# Patient Record
Sex: Female | Born: 1945
Health system: Southern US, Community
[De-identification: ages and names within clinical notes are randomized; demographics above are authoritative.]

## PROBLEM LIST (undated history)

## (undated) DIAGNOSIS — E785 Hyperlipidemia, unspecified: Secondary | ICD-10-CM

## (undated) DIAGNOSIS — G4700 Insomnia, unspecified: Secondary | ICD-10-CM

## (undated) DIAGNOSIS — H269 Unspecified cataract: Secondary | ICD-10-CM

## (undated) DIAGNOSIS — M1990 Unspecified osteoarthritis, unspecified site: Secondary | ICD-10-CM

## (undated) DIAGNOSIS — T7840XA Allergy, unspecified, initial encounter: Secondary | ICD-10-CM

## (undated) DIAGNOSIS — I10 Essential (primary) hypertension: Secondary | ICD-10-CM

## (undated) DIAGNOSIS — Z8742 Personal history of other diseases of the female genital tract: Secondary | ICD-10-CM

## (undated) HISTORY — DX: Unspecified osteoarthritis, unspecified site: M19.90

## (undated) HISTORY — DX: Insomnia, unspecified: G47.00

## (undated) HISTORY — DX: Unspecified cataract: H26.9

## (undated) HISTORY — DX: Allergy, unspecified, initial encounter: T78.40XA

## (undated) HISTORY — DX: Essential (primary) hypertension: I10

## (undated) HISTORY — DX: Hyperlipidemia, unspecified: E78.5

## (undated) HISTORY — PX: ABDOMINAL HYSTERECTOMY: SHX81

## (undated) HISTORY — PX: KNEE SURGERY: SHX244

## (undated) HISTORY — PX: COLONOSCOPY: SHX174

## (undated) HISTORY — DX: Personal history of other diseases of the female genital tract: Z87.42

---

## 1999-04-10 ENCOUNTER — Other Ambulatory Visit: Admission: RE | Admit: 1999-04-10 | Discharge: 1999-04-10 | Payer: Self-pay | Admitting: Family Medicine

## 2000-07-28 ENCOUNTER — Other Ambulatory Visit: Admission: RE | Admit: 2000-07-28 | Discharge: 2000-07-28 | Payer: Self-pay | Admitting: Family Medicine

## 2001-08-08 ENCOUNTER — Other Ambulatory Visit: Admission: RE | Admit: 2001-08-08 | Discharge: 2001-08-08 | Payer: Self-pay | Admitting: Family Medicine

## 2002-12-13 ENCOUNTER — Other Ambulatory Visit: Admission: RE | Admit: 2002-12-13 | Discharge: 2002-12-13 | Payer: Self-pay | Admitting: Family Medicine

## 2003-12-23 ENCOUNTER — Other Ambulatory Visit: Admission: RE | Admit: 2003-12-23 | Discharge: 2003-12-23 | Payer: Self-pay | Admitting: Family Medicine

## 2005-05-18 ENCOUNTER — Other Ambulatory Visit: Admission: RE | Admit: 2005-05-18 | Discharge: 2005-05-18 | Payer: Self-pay | Admitting: *Deleted

## 2005-05-31 ENCOUNTER — Ambulatory Visit (HOSPITAL_COMMUNITY): Admission: RE | Admit: 2005-05-31 | Discharge: 2005-05-31 | Payer: Self-pay

## 2005-06-03 ENCOUNTER — Ambulatory Visit (HOSPITAL_COMMUNITY): Admission: RE | Admit: 2005-06-03 | Discharge: 2005-06-03 | Payer: Self-pay | Admitting: Family Medicine

## 2006-05-20 ENCOUNTER — Other Ambulatory Visit: Admission: RE | Admit: 2006-05-20 | Discharge: 2006-05-20 | Payer: Self-pay | Admitting: Family Medicine

## 2009-07-01 ENCOUNTER — Encounter (INDEPENDENT_AMBULATORY_CARE_PROVIDER_SITE_OTHER): Payer: Self-pay

## 2010-07-14 NOTE — Letter (Signed)
Summary: Pre Visit No Show Letter  [facility]  [address] [place], [place] [address]   Phone: [phone]  Fax: [phone]        July 01, 2009 MRN: [id number]    Mary Wilkinson [address] [place], [place]  [address]    Dear Ms. Myler,   We have been unable to reach you by phone concerning the pre-procedure visit that you missed on 07/01/2009. For this reason,your procedure scheduled on 07/15/2009  has been cancelled. Our scheduling staff will gladly assist you with rescheduling your appointments at a more convenient time. Please call our office at [phone] between the hours of 8:00am and 5:00pm, press option #2 to reach an appointment scheduler. Please consider updating your contact numbers at this time so that we can reach you by phone in the future with schedule changes or results.    Thank you,    Ulis Rias RN [facility]

## 2011-01-20 ENCOUNTER — Ambulatory Visit (AMBULATORY_SURGERY_CENTER): Payer: BC Managed Care – PPO | Admitting: *Deleted

## 2011-01-20 ENCOUNTER — Encounter: Payer: Self-pay | Admitting: Internal Medicine

## 2011-01-20 VITALS — Ht 61.0 in | Wt 157.3 lb

## 2011-01-20 DIAGNOSIS — Z1211 Encounter for screening for malignant neoplasm of colon: Secondary | ICD-10-CM

## 2011-01-20 MED ORDER — PEG-KCL-NACL-NASULF-NA ASC-C 100 G PO SOLR: 1.0000 | Freq: Once | ORAL | Status: DC

## 2011-02-02 ENCOUNTER — Telehealth: Payer: Self-pay | Admitting: Internal Medicine

## 2011-02-02 DIAGNOSIS — Z1211 Encounter for screening for malignant neoplasm of colon: Secondary | ICD-10-CM

## 2011-02-02 MED ORDER — PEG-KCL-NACL-NASULF-NA ASC-C 100 G PO SOLR: 1.0000 | Freq: Once | ORAL | Status: DC

## 2011-02-02 NOTE — Telephone Encounter (Signed)
Prescription resent to [facility] pharmacy in [place].  Pharmacy also called and a message left to call [facility] if Moviprep rx not received.  Pt aware

## 2011-02-03 ENCOUNTER — Ambulatory Visit (AMBULATORY_SURGERY_CENTER): Payer: BC Managed Care – PPO | Admitting: Internal Medicine

## 2011-02-03 ENCOUNTER — Encounter: Payer: Self-pay | Admitting: Internal Medicine

## 2011-02-03 VITALS — BP 145/75 | HR 61 | Temp 97.7°F | Resp 16 | Ht 61.0 in | Wt 151.0 lb

## 2011-02-03 DIAGNOSIS — Z1211 Encounter for screening for malignant neoplasm of colon: Secondary | ICD-10-CM

## 2011-02-03 MED ORDER — SODIUM CHLORIDE 0.9 % IV SOLN: 500.0000 mL | INTRAVENOUS | Status: DC

## 2011-02-03 NOTE — Patient Instructions (Signed)
Discharge instructions reviewed with patient and care partner.  Normal exam.  May resume medications as you had been taking them prior to your procedure.

## 2011-02-03 NOTE — Progress Notes (Signed)
Pt blood pressure 81/45. MD notified. IVF open to gravity. Pt arousable, able to state name and care partners name. Transferring to recovery room.

## 2011-02-04 ENCOUNTER — Telehealth: Payer: Self-pay

## 2011-02-04 NOTE — Telephone Encounter (Signed)

## 2011-05-05 DIAGNOSIS — M25562 Pain in left knee: Secondary | ICD-10-CM | POA: Insufficient documentation

## 2011-10-06 ENCOUNTER — Encounter: Payer: Self-pay | Admitting: Internal Medicine

## 2011-10-07 ENCOUNTER — Ambulatory Visit (INDEPENDENT_AMBULATORY_CARE_PROVIDER_SITE_OTHER): Payer: BC Managed Care – PPO | Admitting: Internal Medicine

## 2011-10-07 ENCOUNTER — Encounter: Payer: Self-pay | Admitting: Internal Medicine

## 2011-10-07 VITALS — BP 138/80 | HR 60 | Ht 61.0 in | Wt 156.0 lb

## 2011-10-07 DIAGNOSIS — K6289 Other specified diseases of anus and rectum: Secondary | ICD-10-CM

## 2011-10-07 DIAGNOSIS — E785 Hyperlipidemia, unspecified: Secondary | ICD-10-CM | POA: Insufficient documentation

## 2011-10-07 DIAGNOSIS — K648 Other hemorrhoids: Secondary | ICD-10-CM | POA: Insufficient documentation

## 2011-10-07 DIAGNOSIS — I10 Essential (primary) hypertension: Secondary | ICD-10-CM | POA: Insufficient documentation

## 2011-10-07 MED ORDER — HYDROCORTISONE ACETATE 25 MG RE SUPP: 25.0000 mg | Freq: Two times a day (BID) | RECTAL | Status: DC

## 2011-10-07 MED ORDER — HYDROCORTISONE ACETATE 25 MG RE SUPP: 25.0000 mg | Freq: Two times a day (BID) | RECTAL | Status: AC

## 2011-10-07 NOTE — Patient Instructions (Signed)
We have sent medications to your pharmacy for you to pick up at your convenience   

## 2011-10-07 NOTE — Progress Notes (Signed)
Subjective:    Patient ID: Mary Wilkinson, female    DOB: 01/22/1946, 66 y.o.   MRN: [id number]  HPI Mary Wilkinson is a 66 yo female with a past mental history of hypertension hyperlipidemia who is seen for evaluation of rectal discomfort. The patient had a screening colonoscopy on 02/03/2011 which was normal except for small internal hemorrhoids. She reports the sensation of rectal fullness for the presence of "piece of tissue" around her rectum. This is been going on for several months, and she first noticed it after her colonoscopy in August. She reports that this is most noticeable is sitting and when lying down at night. She's not had any abdominal pain. No nausea or vomiting. She is eating well. No fevers or chills. She has not had constipation or diarrhea. No rectal bleeding or melena. She is having normal bowel movements, and having one soft but formed stool daily. She does drink Muscadine juice every night to help keep her bowel habits regular. She denies tenesmus.  Review of Systems As per history of present illness, otherwise negative  Past Medical History  Diagnosis Date  . Allergy   . Arthritis     left knee  . Hyperlipidemia   . Hypertension    Past Surgical History  Procedure Date  . Abdominal hysterectomy   . Knee surgery     left   Current Outpatient Prescriptions  Medication Sig Dispense Refill  . atorvastatin (LIPITOR) 20 MG tablet Take 20 mg by mouth daily.        Marland Kitchen ESTRADIOL PO Take 1 mg by mouth daily.        Marland Kitchen lisinopril (PRINIVIL,ZESTRIL) 40 MG tablet Take 40 mg by mouth daily.        . Omega-3 Fatty Acids (FISH OIL PO) Take 1 tablet by mouth daily.        . Thiamine HCl (VITAMIN B-1 PO) Take 1 tablet by mouth daily.        . hydrocortisone (ANUSOL-HC) 25 MG suppository Place 1 suppository (25 mg total) rectally 2 (two) times daily.  24 suppository  1   No Known Allergies  Family History  Problem Relation Age of Onset  . Colon cancer Neg Hx   . Esophageal cancer  Neg Hx   . Stomach cancer Neg Hx    History  Substance Use Topics  . Smoking status: Never Smoker   . Smokeless tobacco: Never Used  . Alcohol Use: No       Objective:   Physical Exam BP 138/80  Pulse 60  Ht 5\' 1"  (1.549 m)  Wt 156 lb (70.761 kg)  BMI 29.48 kg/m2 Constitutional: Well-developed and well-nourished. No distress. HEENT: Normocephalic and atraumatic. Oropharynx is clear and moist. No oropharyngeal exudate. Conjunctivae are normal. Pupils are equal round and reactive to light. No scleral icterus. Neck: Neck supple. Trachea midline. Cardiovascular: Normal rate, regular rhythm and intact distal pulses. No M/R/G Pulmonary/chest: Effort normal and breath sounds normal. No wheezing, rales or rhonchi. Abdominal: Soft, nontender, nondistended. Bowel sounds active throughout. There are no masses palpable. No hepatosplenomegaly. Rectal: Nontender, no external hemorrhoids or fissures seen, soft brown stool in the vault, no rectal masses Extremities: no clubbing, cyanosis, or edema Lymphadenopathy: No cervical adenopathy noted. Neurological: Alert and oriented to person place and time. Skin: Skin is warm and dry. No rashes noted. Psychiatric: Normal mood and affect. Behavior is normal.     Assessment & Plan:  66 yo female with a past mental history of hypertension  hyperlipidemia who is seen for evaluation of rectal discomfort  1. Rectal discomfort -- the patient has had screening colonoscopy in August 2012 which revealed internal hemorrhoids, but was otherwise normal. Having had this exam recently, this is very reassuring against other more concerning or ominous pathology. This may be secondary to internal hemorrhoids, and I have recommended hydrocortisone suppository twice a day for 10 days. Hopefully this will treat internal hemorrhoids, and improve her rectal sensation/discomfort. I've asked her to notify me if this medication does not help or should she develop other symptoms  such as tenesmus, rectal bleeding, abdominal pain, etc. She voices understanding and is happy with this plan.    Followup as needed

## 2012-02-25 ENCOUNTER — Ambulatory Visit: Payer: BC Managed Care – PPO | Admitting: Family Medicine

## 2012-03-09 ENCOUNTER — Encounter: Payer: Self-pay | Admitting: Family Medicine

## 2012-03-09 ENCOUNTER — Ambulatory Visit (INDEPENDENT_AMBULATORY_CARE_PROVIDER_SITE_OTHER): Payer: BC Managed Care – PPO | Admitting: Family Medicine

## 2012-03-09 VITALS — BP 138/84 | HR 74 | Resp 15 | Ht 60.75 in | Wt 155.4 lb

## 2012-03-09 DIAGNOSIS — R251 Tremor, unspecified: Secondary | ICD-10-CM

## 2012-03-09 DIAGNOSIS — E785 Hyperlipidemia, unspecified: Secondary | ICD-10-CM

## 2012-03-09 DIAGNOSIS — I10 Essential (primary) hypertension: Secondary | ICD-10-CM

## 2012-03-09 DIAGNOSIS — Z780 Asymptomatic menopausal state: Secondary | ICD-10-CM

## 2012-03-09 DIAGNOSIS — N951 Menopausal and female climacteric states: Secondary | ICD-10-CM

## 2012-03-09 DIAGNOSIS — R259 Unspecified abnormal involuntary movements: Secondary | ICD-10-CM

## 2012-03-09 DIAGNOSIS — R51 Headache: Secondary | ICD-10-CM

## 2012-03-09 DIAGNOSIS — M1710 Unilateral primary osteoarthritis, unspecified knee: Secondary | ICD-10-CM | POA: Insufficient documentation

## 2012-03-09 DIAGNOSIS — M179 Osteoarthritis of knee, unspecified: Secondary | ICD-10-CM | POA: Insufficient documentation

## 2012-03-09 DIAGNOSIS — R519 Headache, unspecified: Secondary | ICD-10-CM | POA: Insufficient documentation

## 2012-03-09 DIAGNOSIS — IMO0002 Reserved for concepts with insufficient information to code with codable children: Secondary | ICD-10-CM

## 2012-03-09 LAB — BASIC METABOLIC PANEL
BUN: 10 mg/dL (ref 6–23)
CO2: 29 mEq/L (ref 19–32)
Chloride: 104 mEq/L (ref 96–112)
Glucose, Bld: 80 mg/dL (ref 70–99)
Potassium: 3.9 mEq/L (ref 3.5–5.3)

## 2012-03-09 LAB — CBC
MCH: 27.1 pg (ref 26.0–34.0)
MCHC: 33.6 g/dL (ref 30.0–36.0)
MCV: 80.5 fL (ref 78.0–100.0)
Platelets: 294 10*3/uL (ref 150–400)
RBC: 4.73 MIL/uL (ref 3.87–5.11)

## 2012-03-09 MED ORDER — ATORVASTATIN CALCIUM 40 MG PO TABS: 20.0000 mg | ORAL_TABLET | Freq: Every day | ORAL | Status: DC

## 2012-03-09 NOTE — Assessment & Plan Note (Signed)
Will trend, good control

## 2012-03-09 NOTE — Assessment & Plan Note (Signed)
Lipo profile shows large LDLD particles, her lipitor was recently increased to 40mg , I do not see entire FLP today, will obtain with records

## 2012-03-09 NOTE — Patient Instructions (Addendum)
Continue your current medications I will get your records  Schedule your Mammogram  Restart Calcium  And Vit D  MRI of brain to be done  F/U 3 months

## 2012-03-09 NOTE — Progress Notes (Signed)
  Subjective:    Patient ID: Mary Wilkinson, female    DOB: 1946/05/27, 66 y.o.   MRN: [id number]  HPI  Patient here to establish care. Previous PCP [place]. Ortho- Dr. Anne Hahn, [facility]  Medications and history Patient declines flu shot, TDAP. Up-to-date, colonoscopy UTD Mammogram November  She is concerned about recurrent pains that she gets on the left side of her head that come and go. They're associated with the tremor in her face and lasts for a few minutes. She is unsure of any insinuating factors she denies any aura prior to. Denies any change in vision occasionally has dizziness. She did discuss this with her previous PCP however that time decided to wait consider being seen by neurology and this is been recurrent for the past 6 months.  Review of Systems  GEN- denies fatigue, fever, weight loss,weakness, recent illness HEENT- denies eye drainage, change in vision, nasal discharge, CVS- denies chest pain, palpitations RESP- denies SOB, cough, wheeze ABD- denies N/V, change in stools, abd pain GU- denies dysuria, hematuria, dribbling, incontinence MSK- + joint pain, muscle aches, injury Neuro- + headache, dizziness, syncope, seizure activity, +tremor      Objective:   Physical Exam GEN- NAD, alert and oriented x3 HEENT- PERRL, EOMI, non injected sclera, pink conjunctiva, MMM, oropharynx clear Neck- Supple, no thryomegaly CVS- RRR, no murmur RESP-CTAB ABD-NABS,soft,NT,ND EXT- No edema Pulses- Radial 2+ Neuro- CNII-XII in tact, no focal deficits       Assessment & Plan:

## 2012-03-09 NOTE — Assessment & Plan Note (Signed)
Her tremor no headaches are concerning. She's had progressed we were symptoms of the past 6 months. I'll obtain MRI of brain and refer to neurology for further evaluation

## 2012-03-09 NOTE — Assessment & Plan Note (Signed)
Followed by ortho, she gets cortisone injections as needed

## 2012-03-09 NOTE — Assessment & Plan Note (Signed)
Per above regarding tremor, MRI , neurology, she has an aunt with similar symptoms, diagnosis is unknown

## 2012-03-09 NOTE — Assessment & Plan Note (Signed)
She's been on hormone therapy since 1990. We discussed importance of coming off the medication. She states she was told by her previous physician and has been trying to cut back. She's now recently cut back to one milligram every other day. We will hold here with this new change she states that her symptoms are worse going down on the medications. She will require a very slow taper with prolonged use

## 2012-04-05 ENCOUNTER — Other Ambulatory Visit (HOSPITAL_COMMUNITY): Payer: BC Managed Care – PPO

## 2012-04-10 ENCOUNTER — Ambulatory Visit (HOSPITAL_COMMUNITY): Admission: RE | Admit: 2012-04-10 | Payer: BC Managed Care – PPO | Source: Ambulatory Visit

## 2012-04-14 ENCOUNTER — Telehealth: Payer: Self-pay | Admitting: Nurse Practitioner

## 2012-04-14 NOTE — Telephone Encounter (Signed)
Pt aware.

## 2012-04-14 NOTE — Telephone Encounter (Signed)
Called pt and LVM, she can call back Monday It seems she cancelled her MRI and the labs were normal so no call was made, but I do not see any documentation where she was trying to call our office and never received calls back Will apologize for any incovience  I still recommend MRI for her symptoms

## 2012-04-17 ENCOUNTER — Telehealth: Payer: Self-pay | Admitting: Family Medicine

## 2012-04-17 MED ORDER — ESTRADIOL 1 MG PO TABS: ORAL_TABLET | ORAL | Status: DC

## 2012-04-17 NOTE — Telephone Encounter (Signed)
Spoke with pt, no longer having tremor, she canceled MRI because she did not hear from our office for a few weeks and had another MRI scheduled for her knee in [place]. She also took herself completely off the estradiol is having severe symptoms of hot flashes and sweats. Advise her to go back at 1 mg every other day as previous she has been on and should therapy for greater than 20 years. She tried extra vitamin however this did not help New script sent

## 2012-05-05 ENCOUNTER — Other Ambulatory Visit: Payer: Self-pay

## 2012-05-05 MED ORDER — ATORVASTATIN CALCIUM 40 MG PO TABS: 40.0000 mg | ORAL_TABLET | Freq: Every day | ORAL | Status: DC

## 2012-06-01 ENCOUNTER — Ambulatory Visit: Payer: BC Managed Care – PPO | Attending: Orthopedic Surgery | Admitting: Physical Therapy

## 2012-06-01 DIAGNOSIS — M25569 Pain in unspecified knee: Secondary | ICD-10-CM | POA: Insufficient documentation

## 2012-06-01 DIAGNOSIS — R5381 Other malaise: Secondary | ICD-10-CM | POA: Insufficient documentation

## 2012-06-01 DIAGNOSIS — M25669 Stiffness of unspecified knee, not elsewhere classified: Secondary | ICD-10-CM | POA: Insufficient documentation

## 2012-06-01 DIAGNOSIS — R269 Unspecified abnormalities of gait and mobility: Secondary | ICD-10-CM | POA: Insufficient documentation

## 2012-06-01 DIAGNOSIS — IMO0001 Reserved for inherently not codable concepts without codable children: Secondary | ICD-10-CM | POA: Insufficient documentation

## 2012-06-05 ENCOUNTER — Ambulatory Visit: Payer: BC Managed Care – PPO | Admitting: Physical Therapy

## 2012-06-08 ENCOUNTER — Ambulatory Visit: Payer: BC Managed Care – PPO | Admitting: Physical Therapy

## 2012-06-12 ENCOUNTER — Ambulatory Visit: Payer: BC Managed Care – PPO | Admitting: Physical Therapy

## 2012-06-13 DIAGNOSIS — Z96652 Presence of left artificial knee joint: Secondary | ICD-10-CM | POA: Insufficient documentation

## 2012-06-15 ENCOUNTER — Ambulatory Visit: Payer: BC Managed Care – PPO | Attending: Orthopedic Surgery | Admitting: Physical Therapy

## 2012-06-15 DIAGNOSIS — R5381 Other malaise: Secondary | ICD-10-CM | POA: Insufficient documentation

## 2012-06-15 DIAGNOSIS — R269 Unspecified abnormalities of gait and mobility: Secondary | ICD-10-CM | POA: Insufficient documentation

## 2012-06-15 DIAGNOSIS — IMO0001 Reserved for inherently not codable concepts without codable children: Secondary | ICD-10-CM | POA: Insufficient documentation

## 2012-06-15 DIAGNOSIS — M25569 Pain in unspecified knee: Secondary | ICD-10-CM | POA: Insufficient documentation

## 2012-06-15 DIAGNOSIS — M25669 Stiffness of unspecified knee, not elsewhere classified: Secondary | ICD-10-CM | POA: Insufficient documentation

## 2012-06-19 ENCOUNTER — Ambulatory Visit: Payer: BC Managed Care – PPO | Admitting: Physical Therapy

## 2012-06-22 ENCOUNTER — Ambulatory Visit: Payer: BC Managed Care – PPO | Admitting: Physical Therapy

## 2012-06-26 ENCOUNTER — Ambulatory Visit: Payer: BC Managed Care – PPO | Admitting: Physical Therapy

## 2012-06-29 ENCOUNTER — Ambulatory Visit: Payer: BC Managed Care – PPO | Admitting: Physical Therapy

## 2012-07-03 ENCOUNTER — Ambulatory Visit: Payer: BC Managed Care – PPO | Admitting: Physical Therapy

## 2012-07-06 ENCOUNTER — Ambulatory Visit: Payer: BC Managed Care – PPO | Admitting: Physical Therapy

## 2012-07-10 ENCOUNTER — Ambulatory Visit: Payer: BC Managed Care – PPO | Admitting: *Deleted

## 2012-09-07 ENCOUNTER — Other Ambulatory Visit: Payer: Self-pay | Admitting: Family Medicine

## 2012-09-18 ENCOUNTER — Ambulatory Visit (INDEPENDENT_AMBULATORY_CARE_PROVIDER_SITE_OTHER): Payer: BC Managed Care – PPO | Admitting: Family Medicine

## 2012-09-18 ENCOUNTER — Encounter: Payer: Self-pay | Admitting: Family Medicine

## 2012-09-18 ENCOUNTER — Telehealth: Payer: Self-pay | Admitting: Nurse Practitioner

## 2012-09-18 ENCOUNTER — Other Ambulatory Visit: Payer: Self-pay

## 2012-09-18 VITALS — BP 132/74 | HR 58 | Resp 18 | Ht 60.75 in | Wt 157.0 lb

## 2012-09-18 DIAGNOSIS — I10 Essential (primary) hypertension: Secondary | ICD-10-CM

## 2012-09-18 DIAGNOSIS — Z1321 Encounter for screening for nutritional disorder: Secondary | ICD-10-CM

## 2012-09-18 DIAGNOSIS — Z780 Asymptomatic menopausal state: Secondary | ICD-10-CM

## 2012-09-18 DIAGNOSIS — E785 Hyperlipidemia, unspecified: Secondary | ICD-10-CM

## 2012-09-18 DIAGNOSIS — IMO0002 Reserved for concepts with insufficient information to code with codable children: Secondary | ICD-10-CM

## 2012-09-18 DIAGNOSIS — N951 Menopausal and female climacteric states: Secondary | ICD-10-CM

## 2012-09-18 DIAGNOSIS — E663 Overweight: Secondary | ICD-10-CM | POA: Insufficient documentation

## 2012-09-18 DIAGNOSIS — Z130 Encounter for screening for diseases of the blood and blood-forming organs and certain disorders involving the immune mechanism: Secondary | ICD-10-CM

## 2012-09-18 DIAGNOSIS — M1710 Unilateral primary osteoarthritis, unspecified knee: Secondary | ICD-10-CM

## 2012-09-18 DIAGNOSIS — Z1329 Encounter for screening for other suspected endocrine disorder: Secondary | ICD-10-CM

## 2012-09-18 DIAGNOSIS — M179 Osteoarthritis of knee, unspecified: Secondary | ICD-10-CM

## 2012-09-18 LAB — CBC
Hemoglobin: 12.2 g/dL (ref 12.0–15.0)
RBC: 4.53 MIL/uL (ref 3.87–5.11)
WBC: 4.9 10*3/uL (ref 4.0–10.5)

## 2012-09-18 MED ORDER — ATORVASTATIN CALCIUM 40 MG PO TABS: ORAL_TABLET | ORAL | Status: DC

## 2012-09-18 NOTE — Assessment & Plan Note (Signed)
Well controlled, fasting labs to be done  Had Mammogram, pt will check on this

## 2012-09-18 NOTE — Progress Notes (Signed)
  Subjective:    Patient ID: Mary Wilkinson, female    DOB: [birth year], 67 y.o.   MRN: [id number]  HPI  Patient here to follow chronic medical problems. she's not been seen since her establishing visit in September of 2013. She recently had a left knee surgery and is currently on Percocet and Advil. She is due for fasting labs. She occasionally has allergies but declines any medications for this. She has been tapering back off her hormone supplement, continues to have some hot flashes Review of Systems  GEN- denies fatigue, fever, weight loss,weakness, recent illness HEENT- denies eye drainage, change in vision, nasal discharge, CVS- denies chest pain, palpitations RESP- denies SOB, cough, wheeze ABD- denies N/V, change in stools, abd pain GU- denies dysuria, hematuria, dribbling, incontinence MSK- denies joint pain, muscle aches, injury Neuro- denies headache, dizziness, syncope, seizure activity      Objective:   Physical Exam GEN- NAD, alert and oriented x3 HEENT- PERRL, EOMI, non injected sclera, pink conjunctiva, MMM, oropharynx clear Neck- Supple, no thryomegaly CVS- RRR, no murmur RESP-CTAB EXT- No edema Pulses- Radial, DP- 2+        Assessment & Plan:

## 2012-09-18 NOTE — Telephone Encounter (Signed)
noted 

## 2012-09-18 NOTE — Telephone Encounter (Signed)
Med refilled for 90 day supply  

## 2012-09-18 NOTE — Assessment & Plan Note (Signed)
S/p surgical intervention, no further meds, advised to decrease aspirin to 81mg  due to NSAID use as well

## 2012-09-18 NOTE — Patient Instructions (Signed)
Get the labs done  We will call with results Check on the Mammogram Try to go to 1/2 a tablet every other day with the hormone  Try the estroven vitamin Take Calcium ( 1200mg ) and Vitamin D ( 800IU) F/U 6 months

## 2012-09-18 NOTE — Assessment & Plan Note (Signed)
Tapering off hormones, will have her decrease to 1/2 tablet QOD

## 2012-09-18 NOTE — Assessment & Plan Note (Signed)
Check FLP and LFT 

## 2012-09-19 LAB — COMPREHENSIVE METABOLIC PANEL
Albumin: 4.4 g/dL (ref 3.5–5.2)
Alkaline Phosphatase: 61 U/L (ref 39–117)
BUN: 11 mg/dL (ref 6–23)
CO2: 29 mEq/L (ref 19–32)
Calcium: 9.8 mg/dL (ref 8.4–10.5)
Chloride: 104 mEq/L (ref 96–112)
Glucose, Bld: 82 mg/dL (ref 70–99)
Potassium: 4.1 mEq/L (ref 3.5–5.3)

## 2012-09-19 LAB — LIPID PANEL
Cholesterol: 187 mg/dL (ref 0–200)
HDL: 68 mg/dL (ref >39)
Total CHOL/HDL Ratio: 2.8 Ratio
Triglycerides: 70 mg/dL (ref <150)
VLDL: 14 mg/dL (ref 0–40)

## 2012-09-21 ENCOUNTER — Telehealth: Payer: Self-pay | Admitting: Family Medicine

## 2012-09-22 NOTE — Telephone Encounter (Signed)
Patient aware.

## 2012-10-26 ENCOUNTER — Ambulatory Visit (INDEPENDENT_AMBULATORY_CARE_PROVIDER_SITE_OTHER): Payer: Medicare Other | Admitting: Family Medicine

## 2012-10-26 ENCOUNTER — Telehealth: Payer: Self-pay | Admitting: Family Medicine

## 2012-10-26 ENCOUNTER — Encounter: Payer: Self-pay | Admitting: Family Medicine

## 2012-10-26 VITALS — BP 150/82 | HR 65 | Resp 16 | Wt 154.0 lb

## 2012-10-26 DIAGNOSIS — G5600 Carpal tunnel syndrome, unspecified upper limb: Secondary | ICD-10-CM | POA: Diagnosis not present

## 2012-10-26 DIAGNOSIS — N951 Menopausal and female climacteric states: Secondary | ICD-10-CM

## 2012-10-26 DIAGNOSIS — I10 Essential (primary) hypertension: Secondary | ICD-10-CM

## 2012-10-26 DIAGNOSIS — Z780 Asymptomatic menopausal state: Secondary | ICD-10-CM

## 2012-10-26 MED ORDER — GABAPENTIN 300 MG PO CAPS: 300.0000 mg | ORAL_CAPSULE | Freq: Every day | ORAL | Status: DC

## 2012-10-26 NOTE — Patient Instructions (Addendum)
Use the wrist splints for 2 weeks, if no improvement Add the gabapentin at bedtime Call if this neither of these help and you will be referred to neurology Try for hot flashes Peridin C, over the counter F/U as previous

## 2012-10-27 ENCOUNTER — Ambulatory Visit: Payer: BC Managed Care – PPO | Admitting: Family Medicine

## 2012-10-30 NOTE — Assessment & Plan Note (Signed)
She's tried to cut back on her estrogen use. She would like to use something more natural. She has used black cohosh and is not helping as much this because she started on estrogen. She still plans to stop estrogen completely. I've also discussed adding vitamin E. and Pericidin C

## 2012-10-30 NOTE — Progress Notes (Signed)
  Subjective:    Patient ID: Mary Wilkinson, female    DOB: [birth year], 67 y.o.   MRN: [id number]  HPI  Patient presents with tingling and numbness has worsened over the past couple of weeks in bilateral hands. She feels like she has to shake her hands out she's had this problem in the past > year ago however it would improve. Often when she is driving or cooking she feels the need to shake her hands out because they tingled. Occasionally she has had a tingling in her feet but it is mostly in her hands. It is also worse when she holds her cell phone. She denies dropping any coffee mugs her cups.  Review of Systems - per above  GEN- denies fatigue, fever, weight loss,weakness, recent illness HEENT- denies eye drainage, change in vision, nasal discharge, CVS- denies chest pain, palpitations RESP- denies SOB, cough, wheeze MSK- denies joint pain, muscle aches, injury Neuro- denies headache, dizziness, syncope, seizure activity      Objective:   Physical Exam  GEN- NAD, alert and oriented x3 HEENT- PERRL, EOMI, non injected sclera, pink conjunctiva, MMM, oropharynx clear Neck- supple, good ROM CVS- RRR, no murmur RESP-CTAB NEURO-CNII-XII in tact, DTR symmetric UE,LE, normal tone, +tinels Right hand, neg prayer sign EXT- No edema Pulses- Radial 2+       Assessment & Plan:

## 2012-10-30 NOTE — Assessment & Plan Note (Signed)
Will treat as carpal tunnel syndrome and bilateral wrists. She will use wrist splints for the next few weeks. If this does not improve she is going to add gabapentin at bedtime she has no relief from either of these and she will be sent to neurology for nerve conduction studies patient agrees with this

## 2012-10-30 NOTE — Assessment & Plan Note (Signed)
BP elevated some today, no change to meds, previously controlled

## 2012-11-09 NOTE — Telephone Encounter (Signed)
Patient is aware 

## 2012-11-10 ENCOUNTER — Other Ambulatory Visit: Payer: Self-pay

## 2012-11-13 ENCOUNTER — Telehealth: Payer: Self-pay | Admitting: Family Medicine

## 2012-11-13 MED ORDER — LISINOPRIL 40 MG PO TABS: 40.0000 mg | ORAL_TABLET | Freq: Every day | ORAL | Status: DC

## 2012-11-13 NOTE — Telephone Encounter (Signed)
Med rf °

## 2013-01-01 DIAGNOSIS — Z96659 Presence of unspecified artificial knee joint: Secondary | ICD-10-CM | POA: Diagnosis not present

## 2013-01-01 DIAGNOSIS — Z471 Aftercare following joint replacement surgery: Secondary | ICD-10-CM | POA: Diagnosis not present

## 2013-01-04 ENCOUNTER — Other Ambulatory Visit: Payer: Self-pay | Admitting: Family Medicine

## 2013-01-10 ENCOUNTER — Other Ambulatory Visit: Payer: Self-pay | Admitting: Family Medicine

## 2013-01-11 NOTE — Telephone Encounter (Signed)
Med refilled.

## 2013-02-09 ENCOUNTER — Ambulatory Visit: Payer: Self-pay | Admitting: Family Medicine

## 2013-03-08 ENCOUNTER — Other Ambulatory Visit: Payer: Self-pay | Admitting: Family Medicine

## 2013-04-13 ENCOUNTER — Ambulatory Visit (INDEPENDENT_AMBULATORY_CARE_PROVIDER_SITE_OTHER): Payer: Medicare Other | Admitting: Family Medicine

## 2013-04-13 VITALS — BP 130/80 | HR 78 | Temp 97.5°F | Resp 18 | Wt 153.0 lb

## 2013-04-13 DIAGNOSIS — N951 Menopausal and female climacteric states: Secondary | ICD-10-CM

## 2013-04-13 DIAGNOSIS — G5600 Carpal tunnel syndrome, unspecified upper limb: Secondary | ICD-10-CM

## 2013-04-13 DIAGNOSIS — I10 Essential (primary) hypertension: Secondary | ICD-10-CM

## 2013-04-13 DIAGNOSIS — Z1231 Encounter for screening mammogram for malignant neoplasm of breast: Secondary | ICD-10-CM

## 2013-04-13 DIAGNOSIS — Z780 Asymptomatic menopausal state: Secondary | ICD-10-CM | POA: Diagnosis not present

## 2013-04-13 MED ORDER — GABAPENTIN 300 MG PO CAPS: 300.0000 mg | ORAL_CAPSULE | Freq: Every day | ORAL | Status: DC

## 2013-04-13 NOTE — Patient Instructions (Signed)
Peridin C - for hot flashes  Try the gabapentin  Mammogram to be set up  F/U 6 months for physical and labs

## 2013-04-14 ENCOUNTER — Encounter: Payer: Self-pay | Admitting: Family Medicine

## 2013-04-14 NOTE — Assessment & Plan Note (Signed)
Well controlled, no change to meds 

## 2013-04-14 NOTE — Assessment & Plan Note (Signed)
Reviewed supplement, off estrogen now Okay to give it a trial

## 2013-04-14 NOTE — Assessment & Plan Note (Signed)
Gabapentin sent, advised nerve conduction next step

## 2013-04-14 NOTE — Progress Notes (Signed)
  Subjective:    Patient ID: Mary Wilkinson, female    DOB: 12-23-1945, 67 y.o.   MRN: [id number]  HPI  Pt here to f/u chronic medical problems. Off estrogen therpay but has recurrence of hot flashes and night sweats. Saw a new supplement on TV called Profemin Continues to have difficulty with carpal tunnel but never started gabapentin, does not want intervention at this time. Wants to try medication Due for Mammogram  Review of Systems - per above  GEN- denies fatigue, fever, weight loss,weakness, recent illness HEENT- denies eye drainage, change in vision, nasal discharge, CVS- denies chest pain, palpitations RESP- denies SOB, cough, wheeze ABD- denies N/V, change in stools, abd pain GU- denies dysuria, hematuria, dribbling, incontinence MSK- denies joint pain, muscle aches, injury Neuro- denies headache, dizziness, syncope, seizure activity      Objective:   Physical Exam GEN- NAD, alert and oriented x3 HEENT- PERRL, EOMI, non injected sclera, pink conjunctiva, MMM, oropharynx clear CVS- RRR, no murmur RESP-CTAB EXT- No edema Pulses- Radial 2+        Assessment & Plan:

## 2013-04-16 ENCOUNTER — Telehealth: Payer: Self-pay | Admitting: *Deleted

## 2013-04-18 NOTE — Telephone Encounter (Signed)
LMTRC

## 2013-04-20 ENCOUNTER — Telehealth: Payer: Self-pay | Admitting: Family Medicine

## 2013-04-20 NOTE — Telephone Encounter (Signed)
Patient would like to know if these medicines are good for hot flashes. Duavee, Brisdelle.

## 2013-04-30 ENCOUNTER — Ambulatory Visit (HOSPITAL_COMMUNITY)
Admission: RE | Admit: 2013-04-30 | Discharge: 2013-04-30 | Disposition: A | Discharge status: Home / Self Care | Payer: Medicare Other | Source: Ambulatory Visit | Attending: Family Medicine | Admitting: Family Medicine

## 2013-04-30 DIAGNOSIS — Z1231 Encounter for screening mammogram for malignant neoplasm of breast: Secondary | ICD-10-CM | POA: Insufficient documentation

## 2013-05-01 NOTE — Telephone Encounter (Signed)
Mary Wilkinson is a low dose of paxil which is an old anti-depressant medication, in low doses it does help with hot flashes. I have a 1 week sample if she wants to try it 1 tablet at bedtime

## 2013-05-01 NOTE — Telephone Encounter (Signed)
Pt aware and will pick up sample tomorrow

## 2013-05-08 ENCOUNTER — Other Ambulatory Visit: Payer: Self-pay | Admitting: Family Medicine

## 2013-05-08 NOTE — Telephone Encounter (Signed)
Medication refilled per protocol. 

## 2013-05-11 ENCOUNTER — Telehealth: Payer: Self-pay | Admitting: Family Medicine

## 2013-05-11 MED ORDER — PAROXETINE MESYLATE 7.5 MG PO CAPS: 1.0000 | ORAL_CAPSULE | Freq: Every day | ORAL | Status: DC

## 2013-05-11 NOTE — Telephone Encounter (Signed)
Medication sent in to pt pharmacy

## 2013-05-11 NOTE — Telephone Encounter (Signed)
?   Brisdelle how many mgs u want her on?

## 2013-05-11 NOTE — Telephone Encounter (Signed)
She wants an Rx for Brisdelle called in to [facility].  Samples worked

## 2013-05-11 NOTE — Telephone Encounter (Signed)
Brisdelle 7.5mg  1 tablet daily  #30 R 6

## 2013-07-07 ENCOUNTER — Other Ambulatory Visit: Payer: Self-pay | Admitting: Family Medicine

## 2013-07-17 ENCOUNTER — Telehealth: Payer: Self-pay | Admitting: Family Medicine

## 2013-07-17 MED ORDER — CYCLOBENZAPRINE HCL 5 MG PO TABS: 5.0000 mg | ORAL_TABLET | Freq: Every evening | ORAL | Status: DC | PRN

## 2013-07-17 NOTE — Telephone Encounter (Signed)
Pt aware of RX.  NTBS if does not help.

## 2013-07-17 NOTE — Telephone Encounter (Signed)
PT is calling to see if she can have something called in for her back pain Call back number [phone]  Pharmacy is [facility] in [place]

## 2013-07-17 NOTE — Telephone Encounter (Signed)
Having low back pain since yesterday, worse today.  Taking the ibuprofen but wants some muscle relaxer to help.  Hurts to move not when still.

## 2013-07-17 NOTE — Telephone Encounter (Signed)
Send in flexeril 5mg  at bedtime prn spasm, #20 R0  Come in for visit if not better

## 2013-10-06 ENCOUNTER — Other Ambulatory Visit: Payer: Self-pay | Admitting: Family Medicine

## 2013-10-08 NOTE — Telephone Encounter (Signed)
Refill appropriate and filled per protocol. 

## 2013-10-12 ENCOUNTER — Ambulatory Visit (INDEPENDENT_AMBULATORY_CARE_PROVIDER_SITE_OTHER): Payer: Medicare Other | Admitting: Family Medicine

## 2013-10-12 ENCOUNTER — Encounter: Payer: Self-pay | Admitting: Family Medicine

## 2013-10-12 VITALS — BP 134/72 | HR 64 | Temp 97.5°F | Resp 16 | Ht 61.0 in | Wt 161.0 lb

## 2013-10-12 DIAGNOSIS — I10 Essential (primary) hypertension: Secondary | ICD-10-CM | POA: Diagnosis not present

## 2013-10-12 DIAGNOSIS — Z130 Encounter for screening for diseases of the blood and blood-forming organs and certain disorders involving the immune mechanism: Secondary | ICD-10-CM | POA: Diagnosis not present

## 2013-10-12 DIAGNOSIS — Z23 Encounter for immunization: Secondary | ICD-10-CM

## 2013-10-12 DIAGNOSIS — Z0000 Encounter for general adult medical examination without abnormal findings: Secondary | ICD-10-CM | POA: Insufficient documentation

## 2013-10-12 DIAGNOSIS — M949 Disorder of cartilage, unspecified: Secondary | ICD-10-CM | POA: Diagnosis not present

## 2013-10-12 DIAGNOSIS — E663 Overweight: Secondary | ICD-10-CM

## 2013-10-12 DIAGNOSIS — E785 Hyperlipidemia, unspecified: Secondary | ICD-10-CM

## 2013-10-12 DIAGNOSIS — M899 Disorder of bone, unspecified: Secondary | ICD-10-CM | POA: Diagnosis not present

## 2013-10-12 DIAGNOSIS — Z1321 Encounter for screening for nutritional disorder: Secondary | ICD-10-CM

## 2013-10-12 DIAGNOSIS — N951 Menopausal and female climacteric states: Secondary | ICD-10-CM

## 2013-10-12 DIAGNOSIS — G4700 Insomnia, unspecified: Secondary | ICD-10-CM

## 2013-10-12 DIAGNOSIS — Z13228 Encounter for screening for other metabolic disorders: Secondary | ICD-10-CM

## 2013-10-12 DIAGNOSIS — Z780 Asymptomatic menopausal state: Secondary | ICD-10-CM

## 2013-10-12 DIAGNOSIS — Z1329 Encounter for screening for other suspected endocrine disorder: Secondary | ICD-10-CM

## 2013-10-12 LAB — COMPREHENSIVE METABOLIC PANEL
ALBUMIN: 4.2 g/dL (ref 3.5–5.2)
ALT: 22 U/L (ref 0–35)
AST: 24 U/L (ref 0–37)
Alkaline Phosphatase: 81 U/L (ref 39–117)
BUN: 13 mg/dL (ref 6–23)
CALCIUM: 9.6 mg/dL (ref 8.4–10.5)
CHLORIDE: 106 meq/L (ref 96–112)
CO2: 26 mEq/L (ref 19–32)
Creat: 0.9 mg/dL (ref 0.50–1.10)
Glucose, Bld: 97 mg/dL (ref 70–99)
Potassium: 4.3 mEq/L (ref 3.5–5.3)
Sodium: 142 mEq/L (ref 135–145)
Total Bilirubin: 0.8 mg/dL (ref 0.2–1.2)
Total Protein: 6.6 g/dL (ref 6.0–8.3)

## 2013-10-12 LAB — CBC WITH DIFFERENTIAL/PLATELET
BASOS ABS: 0 10*3/uL (ref 0.0–0.1)
Basophils Relative: 0 % (ref 0–1)
Eosinophils Absolute: 0.1 10*3/uL (ref 0.0–0.7)
Eosinophils Relative: 2 % (ref 0–5)
HCT: 36.7 % (ref 36.0–46.0)
Hemoglobin: 12.3 g/dL (ref 12.0–15.0)
LYMPHS ABS: 1.8 10*3/uL (ref 0.7–4.0)
Lymphocytes Relative: 43 % (ref 12–46)
MCH: 26.9 pg (ref 26.0–34.0)
MCHC: 33.5 g/dL (ref 30.0–36.0)
MCV: 80.1 fL (ref 78.0–100.0)
Monocytes Absolute: 0.3 10*3/uL (ref 0.1–1.0)
Monocytes Relative: 7 % (ref 3–12)
NEUTROS ABS: 2.1 10*3/uL (ref 1.7–7.7)
Neutrophils Relative %: 48 % (ref 43–77)
PLATELETS: 276 10*3/uL (ref 150–400)
RBC: 4.58 MIL/uL (ref 3.87–5.11)
RDW: 14.2 % (ref 11.5–15.5)
WBC: 4.3 10*3/uL (ref 4.0–10.5)

## 2013-10-12 LAB — LIPID PANEL
Cholesterol: 177 mg/dL (ref 0–200)
HDL: 67 mg/dL (ref >39)
LDL Cholesterol: 93 mg/dL (ref 0–99)
Total CHOL/HDL Ratio: 2.6 Ratio
Triglycerides: 84 mg/dL (ref <150)
VLDL: 17 mg/dL (ref 0–40)

## 2013-10-12 NOTE — Assessment & Plan Note (Signed)
I will have her try melatonin for sleep for the next 2 weeks. If this does not improve I will try her on trazodone

## 2013-10-12 NOTE — Progress Notes (Signed)
Patient ID: Mary Wilkinson, female   DOB: [birth year], 68 y.o.   MRN: [id number] Subjective:   Patient presents for Medicare Annual/Subsequent preventive examination.   Patient here for physical exam. She has had some difficulty sleeping states she's tried adult p.m. and other  Pm sleeping medications she did try the melatonin but only took a couple of times to. Medications were reviewed. Her mammogram is up-to-date colonoscopy up-to-date. She is due for bone density.  Review Past Medical/Family/Social: Per EMR   Risk Factors  Current exercise habits: walks some Dietary issues discussed: Yes  Cardiac risk factors: Overweight, HTN, Hyperlipidemia (BMI >= 30 kg/m2).   Depression Screen  (Note: if answer to either of the following is "Yes", a more complete depression screening is indicated)  Over the past two weeks, have you felt down, depressed or hopeless? No Over the past two weeks, have you felt little interest or pleasure in doing things? No Have you lost interest or pleasure in daily life? No Do you often feel hopeless? No Do you cry easily over simple problems? No   Activities of Daily Living  In your present state of health, do you have any difficulty performing the following activities?:  Driving? No  Managing money? No  Feeding yourself? No  Getting from bed to chair? No  Climbing a flight of stairs? No  Preparing food and eating?: No  Bathing or showering? No  Getting dressed: No  Getting to the toilet? No  Using the toilet:No  Moving around from place to place: No  In the past year have you fallen or had a near fall?:No  Are you sexually active? No  Do you have more than one partner? No   Hearing Difficulties: No  Do you often ask people to speak up or repeat themselves? No  Do you experience ringing or noises in your ears? No Do you have difficulty understanding soft or whispered voices? No  Do you feel that you have a problem with memory? No Do you often misplace  items? No  Do you feel safe at home? Yes  Cognitive Testing  Alert? Yes Normal Appearance?Yes  Oriented to person? Yes Place? Yes  Time? Yes  Recall of three objects? Yes  Can perform simple calculations? Yes  Displays appropriate judgment?Yes  Can read the correct time from a watch face?Yes   List the Names of Other Physician/Practitioners you currently use: Dr. [facility]    Screening Tests / Date Colonoscopy         -UTD            Zostavax - Declines Mammogram -UTD Influenza Vaccine -UTD Tetanus/tdap- unable insurance coverage  ROS GEN- denies fatigue, fever, weight loss,weakness, recent illness HEENT- denies eye drainage, change in vision, nasal discharge, CVS- denies chest pain, palpitations RESP- denies SOB, cough, wheeze ABD- denies N/V, change in stools, abd pain GU- denies dysuria, hematuria, dribbling, incontinence MSK- denies joint pain, muscle aches, injury Neuro- denies headache, dizziness, syncope, seizure activity  Physical GEN- NAD, alert and oriented x3 HEENT- PERRL, EOMI, non injected sclera, pink conjunctiva, MMM, oropharynx clear, TM clear bilat Neck- Supple, no thryomegaly CVS- RRR, no murmur RESP-CTAB ABD-NABS,soft,NT,ND EXT- No edema Pulses- Radial, DP- 2+    Assessment:    Annual wellness medicare exam   Plan:    During the course of the visit the patient was educated and counseled about appropriate screening and preventive services including:  Screening mammography  Colorectal cancer screening  Shingles vaccine. -Declines  Diet review for nutrition referral? Yes ____ Not Indicated __x__  Patient Instructions (the written plan) was given to the patient.  Medicare Attestation  I have personally reviewed:  The patient's medical and social history  Their use of alcohol, tobacco or illicit drugs  Their current medications and supplements  The patient's functional ability including ADLs,fall risks, home safety risks, cognitive, and  hearing and visual impairment  Diet and physical activities  Evidence for depression or mood disorders  The patient's weight, height, BMI, and visual acuity have been recorded in the chart. I have made referrals, counseling, and provided education to the patient based on review of the above and I have provided the patient with a written personalized care plan for preventive services.

## 2013-10-12 NOTE — Patient Instructions (Addendum)
Try the Melatonin every night for sleep  We will call with lab results Continue current medications  Bone Density to be set up  Prevnar 13 - shot given F/U 4 months

## 2013-10-12 NOTE — Assessment & Plan Note (Signed)
Well controlled 

## 2013-10-12 NOTE — Assessment & Plan Note (Signed)
Doing well on on low dose paxil

## 2013-10-12 NOTE — Assessment & Plan Note (Signed)
Discussed weight gain and need for dietary changes, exercise

## 2013-10-12 NOTE — Assessment & Plan Note (Signed)
Per Above Prevnar 13 given Bone Density Declines shingles at this time

## 2013-10-12 NOTE — Assessment & Plan Note (Signed)
On lipitor check LFT, FLP

## 2013-10-13 LAB — VITAMIN D 25 HYDROXY (VIT D DEFICIENCY, FRACTURES): VIT D 25 HYDROXY: 52 ng/mL (ref 30–89)

## 2013-10-15 ENCOUNTER — Encounter: Payer: Self-pay | Admitting: *Deleted

## 2013-10-25 ENCOUNTER — Telehealth: Payer: Self-pay | Admitting: Family Medicine

## 2013-10-25 NOTE — Telephone Encounter (Signed)
[phone] Melatonin 3 mg Makes her wide awake, doesn't help her sleep  Can you please give her something else?  And the pt is needing something for her hands at night.

## 2013-10-26 MED ORDER — TRAZODONE HCL 50 MG PO TABS: 25.0000 mg | ORAL_TABLET | Freq: Every evening | ORAL | Status: DC | PRN

## 2013-10-26 MED ORDER — GABAPENTIN 300 MG PO CAPS: 300.0000 mg | ORAL_CAPSULE | Freq: Every day | ORAL | Status: DC

## 2013-10-26 NOTE — Telephone Encounter (Signed)
Tried to call pt. No answer, no voicemail.  Did send RX to pharmacy.  Will continue to try to reach patient

## 2013-10-26 NOTE — Telephone Encounter (Signed)
Send Trazodone 50mg  po QHS prn insomnia, #30 R 3  Please see what the hands at night issue is?

## 2013-10-26 NOTE — Telephone Encounter (Signed)
Pt says hand go numb at night.  Says you have treated her for this before.

## 2013-10-26 NOTE — Telephone Encounter (Signed)
Send in gabapentin 300mg  QHS #30 R2

## 2013-10-26 NOTE — Telephone Encounter (Signed)
RX to pharmacy.  Left patient message

## 2013-12-08 ENCOUNTER — Other Ambulatory Visit: Payer: Self-pay | Admitting: Family Medicine

## 2013-12-10 ENCOUNTER — Other Ambulatory Visit: Payer: Self-pay | Admitting: Family Medicine

## 2013-12-10 NOTE — Telephone Encounter (Signed)
Refill appropriate and filled per protocol. 

## 2013-12-11 NOTE — Telephone Encounter (Signed)
Refill appropriate and filled per protocol. 

## 2014-01-06 ENCOUNTER — Other Ambulatory Visit: Payer: Self-pay | Admitting: Family Medicine

## 2014-01-07 NOTE — Telephone Encounter (Signed)
Refill appropriate and filled per protocol. 

## 2014-02-12 ENCOUNTER — Encounter: Payer: Self-pay | Admitting: Family Medicine

## 2014-02-12 ENCOUNTER — Ambulatory Visit (INDEPENDENT_AMBULATORY_CARE_PROVIDER_SITE_OTHER): Payer: Medicare Other | Admitting: Family Medicine

## 2014-02-12 VITALS — BP 128/64 | HR 68 | Temp 98.3°F | Resp 14 | Ht 61.0 in | Wt 156.0 lb

## 2014-02-12 DIAGNOSIS — E663 Overweight: Secondary | ICD-10-CM

## 2014-02-12 DIAGNOSIS — G4700 Insomnia, unspecified: Secondary | ICD-10-CM | POA: Diagnosis not present

## 2014-02-12 DIAGNOSIS — G5600 Carpal tunnel syndrome, unspecified upper limb: Secondary | ICD-10-CM

## 2014-02-12 DIAGNOSIS — I10 Essential (primary) hypertension: Secondary | ICD-10-CM

## 2014-02-12 DIAGNOSIS — G5603 Carpal tunnel syndrome, bilateral upper limbs: Secondary | ICD-10-CM

## 2014-02-12 MED ORDER — CYCLOBENZAPRINE HCL 5 MG PO TABS: 5.0000 mg | ORAL_TABLET | Freq: Three times a day (TID) | ORAL | Status: DC | PRN

## 2014-02-12 MED ORDER — GABAPENTIN 300 MG PO CAPS: 300.0000 mg | ORAL_CAPSULE | Freq: Every day | ORAL | Status: DC

## 2014-02-12 NOTE — Patient Instructions (Signed)
Use wrist splints at night Continue current medications Nerve conduction to be done  Bone Density to be done F/U 4 months

## 2014-02-13 ENCOUNTER — Encounter: Payer: Self-pay | Admitting: Family Medicine

## 2014-02-13 NOTE — Assessment & Plan Note (Signed)
Nerve conduction study to be done she will continue the gabapentin 300 mg at bedtime I would not increase during the day as it makes her sleepy. I've also given her a new prescription for wrist splints for both hands

## 2014-02-13 NOTE — Assessment & Plan Note (Signed)
Sleep has improved the trazodone

## 2014-02-13 NOTE — Assessment & Plan Note (Signed)
Intentional weight loss noted 

## 2014-02-13 NOTE — Progress Notes (Signed)
Patient ID: Mary Wilkinson, female   DOB: 06-19-1945, 68 y.o.   MRN: [id number]   Subjective:    Patient ID: Mary Wilkinson, female    DOB: 04/12/1946, 68 y.o.   MRN: [id number]  Patient presents for 4 month F/U  patient here to follow chronic medical problems. She continues to have bilateral tingling and numbness in her hands. She's been using a wrist brace as well as the gabapentin at bedtime which helps. He would like to proceed with nerve conduction testing.  Insomnia she is doing well with the trazodone she uses it as needed.  She still has not had her bone density there's been a scheduling issue from our office with these.  Occasions were reviewed    Review Of Systems:  GEN- denies fatigue, fever, weight loss,weakness, recent illness HEENT- denies eye drainage, change in vision, nasal discharge, CVS- denies chest pain, palpitations RESP- denies SOB, cough, wheeze ABD- denies N/V, change in stools, abd pain GU- denies dysuria, hematuria, dribbling, incontinence MSK-+joint pain, muscle aches, injury Neuro- denies headache, dizziness, syncope, seizure activity       Objective:    BP 128/64  Pulse 68  Temp(Src) 98.3 F (36.8 C) (Oral)  Resp 14  Ht  (1.549 m)  Wt 156 lb (70.761 kg)  BMI 29.49 kg/m2 GEN- NAD, alert and oriented x3 HEENT- PERRL, EOMI, non injected sclera, pink conjunctiva, MMM, oropharynx clear Neck- Supple, fair ROM CVS- RRR, no murmur RESP-CTAB Neuro- +phalens, decreased monofilament tip of 3rd digit bilat hands EXT- No edema Pulses- Radial 2+        Assessment & Plan:      Problem List Items Addressed This Visit   Carpal tunnel syndrome - Primary   Relevant Medications      cyclobenzaprine (FLEXERIL) tablet      gabapentin (NEURONTIN) capsule      Note: This dictation was prepared with Dragon dictation along with smaller phrase technology. Any transcriptional errors that result from this process are unintentional.

## 2014-02-13 NOTE — Assessment & Plan Note (Signed)
Blood pressure well controlled

## 2014-02-14 ENCOUNTER — Telehealth: Payer: Self-pay | Admitting: *Deleted

## 2014-02-14 NOTE — Telephone Encounter (Signed)
Pt has appt on Sept 9 at 2:00pm at [facility] [address] For DEXA scan, pt is aware.

## 2014-02-20 ENCOUNTER — Ambulatory Visit (HOSPITAL_COMMUNITY)
Admission: RE | Admit: 2014-02-20 | Discharge: 2014-02-20 | Disposition: A | Discharge status: Home / Self Care | Payer: Medicare Other | Source: Ambulatory Visit | Attending: Family Medicine | Admitting: Family Medicine

## 2014-02-20 DIAGNOSIS — Z13820 Encounter for screening for osteoporosis: Secondary | ICD-10-CM | POA: Diagnosis not present

## 2014-02-20 DIAGNOSIS — Z780 Asymptomatic menopausal state: Secondary | ICD-10-CM | POA: Insufficient documentation

## 2014-02-21 ENCOUNTER — Encounter: Payer: Self-pay | Admitting: Family Medicine

## 2014-03-20 DIAGNOSIS — G5600 Carpal tunnel syndrome, unspecified upper limb: Secondary | ICD-10-CM | POA: Diagnosis not present

## 2014-03-20 DIAGNOSIS — G609 Hereditary and idiopathic neuropathy, unspecified: Secondary | ICD-10-CM | POA: Diagnosis not present

## 2014-03-20 DIAGNOSIS — M5412 Radiculopathy, cervical region: Secondary | ICD-10-CM | POA: Diagnosis not present

## 2014-03-22 ENCOUNTER — Telehealth: Payer: Self-pay | Admitting: *Deleted

## 2014-03-22 ENCOUNTER — Other Ambulatory Visit: Payer: Self-pay | Admitting: *Deleted

## 2014-03-22 DIAGNOSIS — G5603 Carpal tunnel syndrome, bilateral upper limbs: Secondary | ICD-10-CM

## 2014-03-22 NOTE — Telephone Encounter (Signed)
[facility] need to know if pt wants to go to [place] in [place]

## 2014-03-22 NOTE — Telephone Encounter (Signed)
[place]

## 2014-03-27 ENCOUNTER — Other Ambulatory Visit: Payer: Self-pay | Admitting: Family Medicine

## 2014-03-27 DIAGNOSIS — Z1231 Encounter for screening mammogram for malignant neoplasm of breast: Secondary | ICD-10-CM

## 2014-04-05 ENCOUNTER — Other Ambulatory Visit: Payer: Self-pay | Admitting: Family Medicine

## 2014-04-05 NOTE — Telephone Encounter (Signed)
Refill appropriate and filled per protocol. 

## 2014-04-15 ENCOUNTER — Encounter: Payer: Self-pay | Admitting: Family Medicine

## 2014-04-16 DIAGNOSIS — G5611 Other lesions of median nerve, right upper limb: Secondary | ICD-10-CM | POA: Diagnosis not present

## 2014-04-16 DIAGNOSIS — G5612 Other lesions of median nerve, left upper limb: Secondary | ICD-10-CM | POA: Diagnosis not present

## 2014-05-01 ENCOUNTER — Ambulatory Visit (HOSPITAL_COMMUNITY)
Admission: RE | Admit: 2014-05-01 | Discharge: 2014-05-01 | Disposition: A | Discharge status: Home / Self Care | Payer: Medicare Other | Source: Ambulatory Visit | Attending: Family Medicine | Admitting: Family Medicine

## 2014-05-01 DIAGNOSIS — Z1231 Encounter for screening mammogram for malignant neoplasm of breast: Secondary | ICD-10-CM | POA: Diagnosis not present

## 2014-05-06 DIAGNOSIS — G5611 Other lesions of median nerve, right upper limb: Secondary | ICD-10-CM | POA: Diagnosis not present

## 2014-05-06 DIAGNOSIS — G5601 Carpal tunnel syndrome, right upper limb: Secondary | ICD-10-CM | POA: Diagnosis not present

## 2014-06-01 ENCOUNTER — Other Ambulatory Visit: Payer: Self-pay | Admitting: Family Medicine

## 2014-06-03 NOTE — Telephone Encounter (Signed)
Refill appropriate and filled per protocol. 

## 2014-06-11 ENCOUNTER — Emergency Department (HOSPITAL_COMMUNITY)
Admission: EM | Admit: 2014-06-11 | Discharge: 2014-06-11 | Disposition: A | Discharge status: Home / Self Care | Payer: Medicare Other | Attending: Emergency Medicine | Admitting: Emergency Medicine

## 2014-06-11 ENCOUNTER — Encounter (HOSPITAL_COMMUNITY): Payer: Self-pay | Admitting: Emergency Medicine

## 2014-06-11 ENCOUNTER — Emergency Department (HOSPITAL_COMMUNITY): Payer: Medicare Other

## 2014-06-11 DIAGNOSIS — Z791 Long term (current) use of non-steroidal anti-inflammatories (NSAID): Secondary | ICD-10-CM | POA: Diagnosis not present

## 2014-06-11 DIAGNOSIS — Z79899 Other long term (current) drug therapy: Secondary | ICD-10-CM | POA: Insufficient documentation

## 2014-06-11 DIAGNOSIS — Z7982 Long term (current) use of aspirin: Secondary | ICD-10-CM | POA: Insufficient documentation

## 2014-06-11 DIAGNOSIS — E785 Hyperlipidemia, unspecified: Secondary | ICD-10-CM | POA: Insufficient documentation

## 2014-06-11 DIAGNOSIS — I10 Essential (primary) hypertension: Secondary | ICD-10-CM | POA: Insufficient documentation

## 2014-06-11 DIAGNOSIS — M1990 Unspecified osteoarthritis, unspecified site: Secondary | ICD-10-CM | POA: Diagnosis not present

## 2014-06-11 DIAGNOSIS — M542 Cervicalgia: Secondary | ICD-10-CM | POA: Diagnosis present

## 2014-06-11 MED ORDER — OXYCODONE-ACETAMINOPHEN 5-325 MG PO TABS: 1.0000 | ORAL_TABLET | Freq: Four times a day (QID) | ORAL | Status: DC | PRN

## 2014-06-11 MED ORDER — OXYCODONE-ACETAMINOPHEN 5-325 MG PO TABS
2.0000 | ORAL_TABLET | Freq: Once | ORAL | Status: AC
  Administered 2014-06-11: 2 via ORAL
  Filled 2014-06-11: qty 2

## 2014-06-11 MED ORDER — KETOROLAC TROMETHAMINE 30 MG/ML IJ SOLN
60.0000 mg | Freq: Once | INTRAMUSCULAR | Status: AC
  Administered 2014-06-11: 60 mg via INTRAVENOUS
  Filled 2014-06-11: qty 2

## 2014-06-11 NOTE — Discharge Instructions (Signed)
Ibuprofen 600 mg 3 times daily for the next 5 days.  Percocet as prescribed as needed for pain not relieved with ibuprofen.  Follow-up with your primary Dr. if not improving in the next week to discuss physical therapy or further imaging studies.   Musculoskeletal Pain Musculoskeletal pain is muscle and boney aches and pains. These pains can occur in any part of the body. Your caregiver may treat you without knowing the cause of the pain. They may treat you if blood or urine tests, X-rays, and other tests were normal.  CAUSES There is often not a definite cause or reason for these pains. These pains may be caused by a type of germ (virus). The discomfort may also come from overuse. Overuse includes working out too hard when your body is not fit. Boney aches also come from weather changes. Bone is sensitive to atmospheric pressure changes. HOME CARE INSTRUCTIONS   Ask when your test results will be ready. Make sure you get your test results.  Only take over-the-counter or prescription medicines for pain, discomfort, or fever as directed by your caregiver. If you were given medications for your condition, do not drive, operate machinery or power tools, or sign legal documents for 24 hours. Do not drink alcohol. Do not take sleeping pills or other medications that may interfere with treatment.  Continue all activities unless the activities cause more pain. When the pain lessens, slowly resume normal activities. Gradually increase the intensity and duration of the activities or exercise.  During periods of severe pain, bed rest may be helpful. Lay or sit in any position that is comfortable.  Putting ice on the injured area.  Put ice in a bag.  Place a towel between your skin and the bag.  Leave the ice on for 15 to 20 minutes, 3 to 4 times a day.  Follow up with your caregiver for continued problems and no reason can be found for the pain. If the pain becomes worse or does not go away, it  may be necessary to repeat tests or do additional testing. Your caregiver may need to look further for a possible cause. SEEK IMMEDIATE MEDICAL CARE IF:  You have pain that is getting worse and is not relieved by medications.  You develop chest pain that is associated with shortness or breath, sweating, feeling sick to your stomach (nauseous), or throw up (vomit).  Your pain becomes localized to the abdomen.  You develop any new symptoms that seem different or that concern you. MAKE SURE YOU:   Understand these instructions.  Will watch your condition.  Will get help right away if you are not doing well or get worse. Document Released: 05/31/2005 Document Revised: 08/23/2011 Document Reviewed: 02/02/2013 [facility] Patient Information 2015 [place]. This information is not intended to replace advice given to you by your health care provider. Make sure you discuss any questions you have with your health care provider.

## 2014-06-11 NOTE — ED Notes (Signed)
Patient c/o neck pain; states hurts to turn her neck or bend over.

## 2014-06-11 NOTE — ED Provider Notes (Signed)
CSN: [id number]     Arrival date & time 06/11/14  0325 History   First MD Initiated Contact with Patient 06/11/14 [phone]     Chief Complaint  Patient presents with  . Torticollis     (Consider location/radiation/quality/duration/timing/severity/associated sxs/prior Treatment) HPI Comments: Patient is a 68 year old female with past medical history of arthritis, hypertension, and high cholesterol. She presents with complaints of severe pain in her neck that started yesterday evening. She denies any injury or trauma. She denies any fevers or chills. She denies any headache. The only thing she can remember doing is sleeping awkwardly prior to the onset of her discomfort. She denies any radiation into her arms or legs. She denies any bowel or bladder complaints. She has had no relief with aspirin that she took at home prior to coming here.  Patient is a 68 y.o. female presenting with neck injury. The history is provided by the patient.  Neck Injury This is a new problem. The current episode started 3 to 5 hours ago. The problem occurs constantly. The problem has been rapidly worsening. Exacerbated by: Movement and palpation and turning head. Nothing relieves the symptoms. She has tried ASA for the symptoms. The treatment provided no relief.    Past Medical History  Diagnosis Date  . Allergy   . Arthritis     left knee  . Hyperlipidemia   . Hypertension    Past Surgical History  Procedure Laterality Date  . Abdominal hysterectomy    . Knee surgery      left   Family History  Problem Relation Age of Onset  . Colon cancer Neg Hx   . Esophageal cancer Neg Hx   . Stomach cancer Neg Hx   . Heart disease Mother    History  Substance Use Topics  . Smoking status: Never Smoker   . Smokeless tobacco: Never Used  . Alcohol Use: No   OB History    No data available     Review of Systems  All other systems reviewed and are negative.     Allergies  Review of patient's allergies  indicates no known allergies.  Home Medications   Prior to Admission medications   Medication Sig Start Date End Date Taking? Authorizing Provider  aspirin 325 MG EC tablet Take 325 mg by mouth daily.    Historical Provider, MD  atorvastatin (LIPITOR) 40 MG tablet TAKE ONE TABLET BY MOUTH ONE TIME DAILY    Salley ScarletKawanta F Watkins, MD  BRISDELLE 7.5 MG CAPS TAKE ONE CAPSULE BY MOUTH ONE TIME DAILY 04/05/14   Salley ScarletKawanta F [place], MD  Calcium Carbonate-Vit D-Min (CALCIUM 600 + MINERALS) 600-200 MG-UNIT TABS Take 1 tablet by mouth daily.    Historical Provider, MD  cyclobenzaprine (FLEXERIL) 5 MG tablet Take 1 tablet (5 mg total) by mouth 3 (three) times daily as needed for muscle spasms. 02/12/14   Salley ScarletKawanta F [place], MD  gabapentin (NEURONTIN) 300 MG capsule Take 1 capsule (300 mg total) by mouth at bedtime. 02/12/14   Salley ScarletKawanta F Keyser, MD  ibuprofen (ADVIL,MOTRIN) 200 MG tablet Take 200 mg by mouth 2 (two) times daily.    Historical Provider, MD  lisinopril (PRINIVIL,ZESTRIL) 40 MG tablet TAKE ONE TABLET BY MOUTH ONE    TIME DAILY 06/03/14   Salley ScarletKawanta F [place], MD  Omega-3 Fatty Acids (FISH OIL PO) Take 1 tablet by mouth daily.      Historical Provider, MD  traZODone (DESYREL) 50 MG tablet Take 0.5-1 tablets (25-50 mg total)  by mouth at bedtime as needed for sleep. 10/26/13   Salley ScarletKawanta F [place], MD  vitamin B-12 (CYANOCOBALAMIN) 100 MCG tablet Take 50 mcg by mouth daily.    Historical Provider, MD   BP 145/77 mmHg  Pulse 67  Temp(Src) 98.3 F (36.8 C) (Oral)  Resp 20  Ht 5\' 1"  (1.549 m)  Wt 155 lb (70.308 kg)  BMI 29.30 kg/m2  SpO2 98% Physical Exam  Constitutional: She is oriented to person, place, and time. She appears well-developed and well-nourished. No distress.  HENT:  Head: Normocephalic and atraumatic.  Neck:  There is exquisite tenderness to palpation to the soft tissues of the posterior neck. There is no obvious abnormality. She has pain with range of motion. This appears musculoskeletal and  there are no meningeal signs.  Musculoskeletal: Normal range of motion.  Pulses, motor, and sensory are intact to all 4 extremities.  Neurological: She is alert and oriented to person, place, and time.  Skin: Skin is warm and dry. She is not diaphoretic.  Nursing note and vitals reviewed.   ED Course  Procedures (including critical care time) Labs Review Labs Reviewed - No data to display  Imaging Review No results found.   EKG Interpretation None      MDM   Final diagnoses:  Neck pain    Patient presents here with severe pain in her neck that appears musculoskeletal in nature. She is not having any radiation to her arms or weakness in her extremities. Her x-rays reveal significant degenerative changes in the C5 and C6 vertebrae that I suspect are the cause of her discomfort. She is feeling better with Toradol and Percocet. She will be discharged with recommendations to take ibuprofen and with a prescription for Percocet. If she is not improving in the next week, she is to follow-up with her primary Dr. to discuss either physical therapy or further imaging.    Geoffery Lyonsouglas Doral Ventrella, MD 06/11/14 [id number]

## 2014-06-12 ENCOUNTER — Ambulatory Visit (INDEPENDENT_AMBULATORY_CARE_PROVIDER_SITE_OTHER): Payer: Medicare Other | Admitting: Physician Assistant

## 2014-06-12 ENCOUNTER — Encounter: Payer: Self-pay | Admitting: Physician Assistant

## 2014-06-12 VITALS — BP 124/74 | HR 68 | Temp 99.0°F | Resp 18 | Wt 164.0 lb

## 2014-06-12 DIAGNOSIS — M5030 Other cervical disc degeneration, unspecified cervical region: Secondary | ICD-10-CM

## 2014-06-12 DIAGNOSIS — M542 Cervicalgia: Secondary | ICD-10-CM | POA: Diagnosis not present

## 2014-06-12 MED ORDER — CYCLOBENZAPRINE HCL 10 MG PO TABS: 10.0000 mg | ORAL_TABLET | Freq: Three times a day (TID) | ORAL | Status: DC | PRN

## 2014-06-12 MED ORDER — METHYLPREDNISOLONE ACETATE 80 MG/ML IJ SUSP
80.0000 mg | Freq: Once | INTRAMUSCULAR | Status: AC
  Administered 2014-06-12: 80 mg via INTRAMUSCULAR

## 2014-06-12 MED ORDER — KETOROLAC TROMETHAMINE 60 MG/2ML IM SOLN
60.0000 mg | Freq: Once | INTRAMUSCULAR | Status: AC
Start: 2014-06-12 — End: 2014-06-12
  Administered 2014-06-12: 60 mg via INTRAMUSCULAR

## 2014-06-12 NOTE — Progress Notes (Signed)
Patient ID: Konrad FelixHester M. Rana SnareLowe MRN: [id number], DOB: 1946-04-23, 68 y.o. Date of Encounter: 06/12/2014, 12:58 PM    Chief Complaint:  Chief Complaint  Patient presents with  . severe neck pain    seen at ED yesterday     HPI: 68 y.o. year old AA female presents for f/u eval of neck pain.   I reviewed ED Provider's note and Cervical Spine XRay---HPI and XRay report are copied below:    Chief Complaint  Patient presents with  . Torticollis     (Consider location/radiation/quality/duration/timing/severity/associated sxs/prior Treatment) HPI Comments: Patient is a 68 year old female with past medical history of arthritis, hypertension, and high cholesterol. She presents with complaints of severe pain in her neck that started yesterday evening. She denies any injury or trauma. She denies any fevers or chills. She denies any headache. The only thing she can remember doing is sleeping awkwardly prior to the onset of her discomfort. She denies any radiation into her arms or legs. She denies any bowel or bladder complaints. She has had no relief with aspirin that she took at home prior to coming here.  Patient is a 68 y.o. female presenting with neck injury. The history is provided by the patient.  Neck Injury This is a new problem. The current episode started 3 to 5 hours ago. The problem occurs constantly. The problem has been rapidly worsening. Exacerbated by: Movement and palpation and turning head. Nothing relieves the symptoms. She has tried ASA for the symptoms. The treatment provided no relief.   EXAM: CERVICAL SPINE - 2-3 VIEW  COMPARISON: None.  FINDINGS: There is advanced degenerative disc narrowing and endplate remodeling at the C5-6 and C6-7 levels. Facet arthropathy is also notable in the mid cervical spine, which could results in osseous foraminal stenosis. No oblique radiographs were obtained. There is mild anterolisthesis at C4-5 (1-2 mm) likely related to  facet disease.  No evidence of acute fracture, focal bone lesion, or endplate erosion. No prevertebral swelling.  IMPRESSION: Advanced mid and lower cervical degenerative disc disease. Multilevel facet arthropathy with spurring.   Electronically Signed  By: Tiburcio PeaJonathan Watts M.D.  On: 06/11/2014 06:25   In the ER she was treated with Toradol and Percocet. They planned to discharge her with recommendations to take ibuprofen and with a prescription for Percocet.  Today she brings with her 2 different bottles of Percocet 5 mg/325. One bottle was prescribed from the ER for #25. One bottle was prescribed by Dr. Mina MarbleWeingold for #30.--- She recently had carpal tunnel surgery on the right on November 23.  She reports that the medicine they gave her in the ER which would've been the Toradol 60 did give her some relief and she was able to sleep. Says that otherwise once that wore off she has to continue to change positions etc. because of discomfort. Says that this morning at 4 AM she took 2 of the Percocets.  Reviewed the history documented in the ER note. She tells me that this is correct. She reports that she did no unusual activity and had no injury or trauma to the area. Also had done no overhead activity or no heavy lifting etc. When I entered the exam room, she is standing up holding to the countertop secondary to discomfort. When she tries to look at me she has to move her entire body as she cannot turn her neck.   Home Meds:   Outpatient Prescriptions Prior to Visit  Medication Sig Dispense Refill  .  aspirin 325 MG EC tablet Take 325 mg by mouth daily.    Marland Kitchen atorvastatin (LIPITOR) 40 MG tablet TAKE ONE TABLET BY MOUTH ONE TIME DAILY 90 tablet 3  . BRISDELLE 7.5 MG CAPS TAKE ONE CAPSULE BY MOUTH ONE TIME DAILY 30 capsule 5  . Calcium Carbonate-Vit D-Min (CALCIUM 600 + MINERALS) 600-200 MG-UNIT TABS Take 1 tablet by mouth daily.    Marland Kitchen gabapentin (NEURONTIN) 300 MG capsule Take 1  capsule (300 mg total) by mouth at bedtime. 30 capsule 6  . ibuprofen (ADVIL,MOTRIN) 200 MG tablet Take 200 mg by mouth 2 (two) times daily.    Marland Kitchen lisinopril (PRINIVIL,ZESTRIL) 40 MG tablet TAKE ONE TABLET BY MOUTH ONE    TIME DAILY 30 tablet 4  . Omega-3 Fatty Acids (FISH OIL PO) Take 1 tablet by mouth daily.      Marland Kitchen oxyCODONE-acetaminophen (PERCOCET) 5-325 MG per tablet Take 1-2 tablets by mouth every 6 (six) hours as needed. 25 tablet 0  . traZODone (DESYREL) 50 MG tablet Take 0.5-1 tablets (25-50 mg total) by mouth at bedtime as needed for sleep. 30 tablet 3  . vitamin B-12 (CYANOCOBALAMIN) 100 MCG tablet Take 50 mcg by mouth daily.    . cyclobenzaprine (FLEXERIL) 5 MG tablet Take 1 tablet (5 mg total) by mouth 3 (three) times daily as needed for muscle spasms. (Patient not taking: Reported on 06/12/2014) 20 tablet 1   No facility-administered medications prior to visit.    Allergies: No Known Allergies    Review of Systems: See HPI for pertinent ROS. All other ROS negative.    Physical Exam: Blood pressure 124/74, pulse 68, temperature 99 F (37.2 C), temperature source Oral, resp. rate 18, weight 164 lb (74.39 kg)., Body mass index is 31 kg/(m^2). General:  WNWD AAF. Appears in moderate pain.  Neck: Supple. No thyromegaly. No lymphadenopathy. Severe pain with palpation of bilateral scapula area. Severe pain with palpation of trapezius bilaterally. Palpation of this causes pain to shoot up sides of head.  She cannot turn her head to the Right at all.  She can turn her head to the left 15 degrees.  She cannot tilt ear toward shoulder on either side.  Lungs: Clear bilaterally to auscultation without wheezes, rales, or rhonchi. Breathing is unlabored. Heart: Regular rhythm. No murmurs, rubs, or gallops. Msk:  Strength and tone normal for age. Extremities/Skin: Warm and dry.  Neuro: Alert and oriented X 3. Moves all extremities spontaneously. Gait is normal. CNII-XII grossly in  tact. No meningeal signs.  Psych:  Responds to questions appropriately with a normal affect.     ASSESSMENT AND PLAN:  68 y.o. year old female with  1. Degeneration of cervical intervertebral disc I discussed x-ray findings of cervical spine x-ray done the ER 06/11/14. I discussed that this did show significant abnormalities. However, patient states that prior to this episode of pain she had very minimal discomfort in the neck area. She wants to hold off on scheduling follow-up MRI of this at this point. Wants to just get over this acute episode of pain for now. Also she already has a routine follow-up office visit scheduled to see Dr. Jeanice Lim in the upcoming week. They can follow-up this issue at follow-up visit.  - methylPREDNISolone acetate (DEPO-MEDROL) injection 80 mg; Inject 1 mL (80 mg total) into the muscle once. - ketorolac (TORADOL) injection 60 mg; Inject 2 mLs (60 mg total) into the muscle once.  2. Neck pain Will give her injection of Toradol now.  Give her injection of Depo-Medrol 80 now. We'll give these to try to get her some acute relief. We'll then have her take the Flexeril 3 times daily to try to relax the muscles. Washing her that this may cause drowsiness and increased risk of fall and she is to be very careful with this. Continue the Percocet 5/325 as needed. I told her to apply heat to the neck. Use very warm water in the shower to the area and also can use a heating pad. She has an appointment with Dr. Jeanice Limurham for the upcoming week. Follow-up with us sooner if needed.  - cyclobenzaprine (FLEXERIL) 10 MG tablet; Take 1 tablet (10 mg total) by mouth 3 (three) times daily as needed for muscle spasms.  Dispense: 60 tablet; Refill: 0 - methylPREDNISolone acetate (DEPO-MEDROL) injection 80 mg; Inject 1 mL (80 mg total) into the muscle once. - ketorolac (TORADOL) injection 60 mg; Inject 2 mLs (60 mg total) into the muscle once.   [address], Mary Beth Boulder Canyon[place], [place],  [facility] 06/12/2014 12:58 PM

## 2014-06-17 ENCOUNTER — Telehealth: Payer: Self-pay | Admitting: Family Medicine

## 2014-06-17 ENCOUNTER — Encounter: Payer: Self-pay | Admitting: Family Medicine

## 2014-06-17 ENCOUNTER — Ambulatory Visit (INDEPENDENT_AMBULATORY_CARE_PROVIDER_SITE_OTHER): Payer: Medicare Other | Admitting: Family Medicine

## 2014-06-17 VITALS — BP 132/80 | HR 68 | Temp 97.7°F | Resp 14 | Ht 61.0 in | Wt 158.0 lb

## 2014-06-17 DIAGNOSIS — R51 Headache: Secondary | ICD-10-CM | POA: Diagnosis not present

## 2014-06-17 DIAGNOSIS — E785 Hyperlipidemia, unspecified: Secondary | ICD-10-CM

## 2014-06-17 DIAGNOSIS — M5030 Other cervical disc degeneration, unspecified cervical region: Secondary | ICD-10-CM | POA: Diagnosis not present

## 2014-06-17 DIAGNOSIS — R2990 Unspecified symptoms and signs involving the nervous system: Secondary | ICD-10-CM | POA: Diagnosis not present

## 2014-06-17 DIAGNOSIS — I10 Essential (primary) hypertension: Secondary | ICD-10-CM | POA: Diagnosis not present

## 2014-06-17 DIAGNOSIS — R519 Headache, unspecified: Secondary | ICD-10-CM

## 2014-06-17 LAB — CBC WITH DIFFERENTIAL/PLATELET
BASOS PCT: 0 % (ref 0–1)
Basophils Absolute: 0 10*3/uL (ref 0.0–0.1)
EOS ABS: 0.1 10*3/uL (ref 0.0–0.7)
Eosinophils Relative: 2 % (ref 0–5)
HEMATOCRIT: 38.7 % (ref 36.0–46.0)
Hemoglobin: 13.2 g/dL (ref 12.0–15.0)
LYMPHS ABS: 2.3 10*3/uL (ref 0.7–4.0)
Lymphocytes Relative: 39 % (ref 12–46)
MCH: 27.3 pg (ref 26.0–34.0)
MCHC: 34.1 g/dL (ref 30.0–36.0)
MCV: 80 fL (ref 78.0–100.0)
MONO ABS: 0.6 10*3/uL (ref 0.1–1.0)
MPV: 9 fL (ref 8.6–12.4)
Monocytes Relative: 11 % (ref 3–12)
NEUTROS ABS: 2.8 10*3/uL (ref 1.7–7.7)
Neutrophils Relative %: 48 % (ref 43–77)
Platelets: 359 10*3/uL (ref 150–400)
RBC: 4.84 MIL/uL (ref 3.87–5.11)
RDW: 14.1 % (ref 11.5–15.5)
WBC: 5.9 10*3/uL (ref 4.0–10.5)

## 2014-06-17 LAB — LIPID PANEL
Cholesterol: 200 mg/dL (ref 0–200)
HDL: 61 mg/dL (ref >39)
LDL CALC: 115 mg/dL — AB (ref 0–99)
Total CHOL/HDL Ratio: 3.3 Ratio
Triglycerides: 119 mg/dL (ref <150)
VLDL: 24 mg/dL (ref 0–40)

## 2014-06-17 LAB — COMPREHENSIVE METABOLIC PANEL
ALK PHOS: 99 U/L (ref 39–117)
ALT: 42 U/L — AB (ref 0–35)
AST: 24 U/L (ref 0–37)
Albumin: 4.5 g/dL (ref 3.5–5.2)
BUN: 13 mg/dL (ref 6–23)
CALCIUM: 10 mg/dL (ref 8.4–10.5)
CO2: 24 mEq/L (ref 19–32)
Chloride: 102 mEq/L (ref 96–112)
Creat: 0.91 mg/dL (ref 0.50–1.10)
Glucose, Bld: 88 mg/dL (ref 70–99)
Potassium: 4.3 mEq/L (ref 3.5–5.3)
Sodium: 141 mEq/L (ref 135–145)
Total Bilirubin: 0.7 mg/dL (ref 0.2–1.2)
Total Protein: 7.5 g/dL (ref 6.0–8.3)

## 2014-06-17 NOTE — Assessment & Plan Note (Signed)
I'm concerned for stroke like symptoms especially with the severe headache as well as slurred speech at the time of presentation. I do not think we have any benefit from a CT scan as she continues to have symptoms although neurologically intact I will go ahead and schedule an MRI of the brain to be done as soon as possible. I will also check her electrolytes. Her blood pressure is well controlled and her cholesterol has also been well controlled  For now regarding the neck pain she has severe osteoarthritis in her neck and degenerative changes however she wants to hold on any evaluation of this at this time. We will just use pain medicine as needed as she is currently getting over surgery forearm right carpal tunnel

## 2014-06-17 NOTE — Patient Instructions (Signed)
MRI of brain to be done  We will call with lab results F/U May for PHYSICAL

## 2014-06-17 NOTE — Progress Notes (Signed)
Patient ID: Mary Wilkinson. [place], female   DOB: March 12, 1946, 69 y.o.   MRN: [id number]   Subjective:    Patient ID: Mary Wilkinson, female    DOB: 04-Feb-1946, 69 y.o.   MRN: [id number]  Patient presents for 4 month F/U  patient here for follow-up. She was seen in the emergency room about a week ago after having severe left-sided head pain as well as some stiffness in her neck. She states that the headache is more throbbing sensation her daughter also noticed that her speech was slurred somehow it seems that all this information was not relayed when she went to the emergency room because there was only evaluation of her neck which shows severe osteoarthritis and she was discharged home for follow-up. There's been no further change in her speech but she continues to have the throbbing headache on the left side of her head accompanied by some dizzy spells. She denies any weakness elsewhere on the body. Regarding her neck pain she was seen in our office for quick follow-up she was given a shot of Toradol as well as prednisone and given a muscle relaxer and this helped ease her neck pain off but her headache continues to hurt. She states that there is sensation change on her scalp on the left side as well.    Review Of Systems:  GEN- denies fatigue, fever, weight loss,weakness, recent illness HEENT- denies eye drainage, change in vision, nasal discharge, CVS- denies chest pain, palpitations RESP- denies SOB, cough, wheeze ABD- denies N/V, change in stools, abd pain GU- denies dysuria, hematuria, dribbling, incontinence MSK- denies joint pain, muscle aches, injury Neuro- + headache,+ dizziness, syncope, seizure activity       Objective:    BP 132/80 mmHg  Pulse 68  Temp(Src) 97.7 F (36.5 C) (Oral)  Resp 14  Ht  (1.549 m)  Wt 158 lb (71.668 kg)  BMI 29.87 kg/m2 GEN- NAD, alert and oriented x3 HEENT- PERRL, EOMI, non injected sclera, pink conjunctiva, MMM, oropharynx clear Neck-  Supple,decreased ROM with flexion, and rotation CVS- RRR, no murmur RESP-CTAB NEURO- CNII-XII in tact, normal speech, no nystagumus, sensation in tact bilat, moving all 4 ext equally, normal gait EXT- No edema Pulses- Radial 2+        Assessment & Plan:      Problem List Items Addressed This Visit      Unprioritized   Stroke-like symptoms    I'm concerned for stroke like symptoms especially with the severe headache as well as slurred speech at the time of presentation. I do not think we have any benefit from a CT scan as she continues to have symptoms although neurologically intact I will go ahead and schedule an MRI of the brain to be done as soon as possible. I will also check her electrolytes. Her blood pressure is well controlled and her cholesterol has also been well controlled  For now regarding the neck pain she has severe osteoarthritis in her neck and degenerative changes however she wants to hold on any evaluation of this at this time. We will just use pain medicine as needed as she is currently getting over surgery forearm right carpal tunnel    Relevant Orders      MR Brain Wo Contrast   Hyperlipidemia - Primary   Relevant Orders      CBC with Differential      Lipid panel   HTN (hypertension)    Well controlled    Relevant Orders  CBC with Differential      Comprehensive metabolic panel   Headache   Relevant Orders      MR Brain Wo Contrast   Degeneration of cervical intervertebral disc    Pain improved, hold on further imaging for now       Note: This dictation was prepared with Dragon dictation along with smaller phrase technology. Any transcriptional errors that result from this process are unintentional.

## 2014-06-17 NOTE — Telephone Encounter (Signed)
[phone]  PT is needing to speak to you regarding medications

## 2014-06-17 NOTE — Telephone Encounter (Signed)
I would not recommend any hormone medications until I see her MRI, will discuss with her after MRI has been completed We can always try the XR of a regualar low dose paxil

## 2014-06-17 NOTE — Assessment & Plan Note (Signed)
Well controlled 

## 2014-06-17 NOTE — Assessment & Plan Note (Signed)
Pain improved, hold on further imaging for now

## 2014-06-17 NOTE — Telephone Encounter (Signed)
Call returned to patient.   Reports that coupon for [place] was not accepted at pharmacy. States that medication is $72.00/ month.   Requested MD to recommend new medication. States that she was interested in [place], a comination of bazedoxifene and conjugated estrogens.   MD please advise.

## 2014-06-18 ENCOUNTER — Ambulatory Visit (HOSPITAL_COMMUNITY)
Admission: RE | Admit: 2014-06-18 | Discharge: 2014-06-18 | Disposition: A | Discharge status: Home / Self Care | Payer: Medicare Other | Source: Ambulatory Visit | Attending: Family Medicine | Admitting: Family Medicine

## 2014-06-18 DIAGNOSIS — R51 Headache: Secondary | ICD-10-CM | POA: Insufficient documentation

## 2014-06-18 DIAGNOSIS — R519 Headache, unspecified: Secondary | ICD-10-CM

## 2014-06-18 DIAGNOSIS — M79602 Pain in left arm: Secondary | ICD-10-CM | POA: Diagnosis not present

## 2014-06-18 DIAGNOSIS — R2990 Unspecified symptoms and signs involving the nervous system: Secondary | ICD-10-CM

## 2014-06-19 ENCOUNTER — Other Ambulatory Visit: Payer: Self-pay | Admitting: *Deleted

## 2014-06-19 MED ORDER — PAROXETINE HCL ER 12.5 MG PO TB24: 12.5000 mg | ORAL_TABLET | Freq: Every day | ORAL | Status: DC

## 2014-06-28 ENCOUNTER — Other Ambulatory Visit: Payer: Self-pay | Admitting: Physician Assistant

## 2014-06-28 NOTE — Telephone Encounter (Signed)
Refill appropriate and filled per protocol. 

## 2014-07-03 DIAGNOSIS — M25631 Stiffness of right wrist, not elsewhere classified: Secondary | ICD-10-CM | POA: Diagnosis not present

## 2014-07-03 DIAGNOSIS — L905 Scar conditions and fibrosis of skin: Secondary | ICD-10-CM | POA: Diagnosis not present

## 2014-07-03 DIAGNOSIS — M6281 Muscle weakness (generalized): Secondary | ICD-10-CM | POA: Diagnosis not present

## 2014-07-03 DIAGNOSIS — G5611 Other lesions of median nerve, right upper limb: Secondary | ICD-10-CM | POA: Diagnosis not present

## 2014-07-12 ENCOUNTER — Telehealth: Payer: Self-pay | Admitting: Family Medicine

## 2014-07-12 NOTE — Telephone Encounter (Signed)
Was put on Paxil. Was told to call in a few weeks to report how it is doing.  Says helps hot flashes during the day but still has them at night between 10 to 5.  Keeps her awake, so not sleeping well.  What do you advise?

## 2014-07-12 NOTE — Telephone Encounter (Signed)
Pt called and advised per provider to give medication a few more weeks.  If still then having breakthrough= symptoms call back and she will advise further at that time

## 2014-07-12 NOTE — Telephone Encounter (Signed)
Keep going with the medication, it is a little different than the previous medication so may take a little longer

## 2014-07-23 ENCOUNTER — Other Ambulatory Visit: Payer: Self-pay | Admitting: Family Medicine

## 2014-07-24 NOTE — Telephone Encounter (Signed)
Ok to refill??  Last office visit 06/27/2014.  Last refill 06/28/2014.

## 2014-07-24 NOTE — Telephone Encounter (Signed)
okay

## 2014-07-24 NOTE — Telephone Encounter (Signed)
Refill sent.

## 2014-10-12 ENCOUNTER — Other Ambulatory Visit: Payer: Self-pay | Admitting: Family Medicine

## 2014-10-16 NOTE — Telephone Encounter (Signed)
Refill appropriate and filled per protocol. 

## 2014-10-23 ENCOUNTER — Encounter: Payer: Self-pay | Admitting: Family Medicine

## 2014-10-23 ENCOUNTER — Ambulatory Visit (INDEPENDENT_AMBULATORY_CARE_PROVIDER_SITE_OTHER): Payer: Medicare Other | Admitting: Family Medicine

## 2014-10-23 VITALS — BP 132/68 | HR 72 | Temp 97.4°F | Resp 14 | Ht 61.0 in | Wt 162.0 lb

## 2014-10-23 DIAGNOSIS — Z780 Asymptomatic menopausal state: Secondary | ICD-10-CM | POA: Diagnosis not present

## 2014-10-23 DIAGNOSIS — Z0000 Encounter for general adult medical examination without abnormal findings: Secondary | ICD-10-CM

## 2014-10-23 DIAGNOSIS — N951 Menopausal and female climacteric states: Secondary | ICD-10-CM

## 2014-10-23 DIAGNOSIS — I10 Essential (primary) hypertension: Secondary | ICD-10-CM

## 2014-10-23 DIAGNOSIS — Z23 Encounter for immunization: Secondary | ICD-10-CM | POA: Diagnosis not present

## 2014-10-23 DIAGNOSIS — E785 Hyperlipidemia, unspecified: Secondary | ICD-10-CM | POA: Diagnosis not present

## 2014-10-23 DIAGNOSIS — M170 Bilateral primary osteoarthritis of knee: Secondary | ICD-10-CM

## 2014-10-23 LAB — CBC WITH DIFFERENTIAL/PLATELET
Basophils Absolute: 0 10*3/uL (ref 0.0–0.1)
Basophils Relative: 0 % (ref 0–1)
Eosinophils Absolute: 0.1 10*3/uL (ref 0.0–0.7)
Eosinophils Relative: 2 % (ref 0–5)
HCT: 39 % (ref 36.0–46.0)
HEMOGLOBIN: 12.6 g/dL (ref 12.0–15.0)
LYMPHS PCT: 38 % (ref 12–46)
Lymphs Abs: 1.5 10*3/uL (ref 0.7–4.0)
MCH: 27.3 pg (ref 26.0–34.0)
MCHC: 32.3 g/dL (ref 30.0–36.0)
MCV: 84.4 fL (ref 78.0–100.0)
MPV: 9.4 fL (ref 8.6–12.4)
Monocytes Absolute: 0.4 10*3/uL (ref 0.1–1.0)
Monocytes Relative: 9 % (ref 3–12)
NEUTROS PCT: 51 % (ref 43–77)
Neutro Abs: 2 10*3/uL (ref 1.7–7.7)
Platelets: 299 10*3/uL (ref 150–400)
RBC: 4.62 MIL/uL (ref 3.87–5.11)
RDW: 14.4 % (ref 11.5–15.5)
WBC: 3.9 10*3/uL — ABNORMAL LOW (ref 4.0–10.5)

## 2014-10-23 LAB — LIPID PANEL
Cholesterol: 196 mg/dL (ref 0–200)
HDL: 66 mg/dL (ref >=46)
LDL Cholesterol: 110 mg/dL — ABNORMAL HIGH (ref 0–99)
Total CHOL/HDL Ratio: 3 Ratio
Triglycerides: 102 mg/dL (ref <150)
VLDL: 20 mg/dL (ref 0–40)

## 2014-10-23 LAB — COMPREHENSIVE METABOLIC PANEL
ALK PHOS: 73 U/L (ref 39–117)
ALT: 32 U/L (ref 0–35)
AST: 30 U/L (ref 0–37)
Albumin: 4.3 g/dL (ref 3.5–5.2)
BILIRUBIN TOTAL: 0.6 mg/dL (ref 0.2–1.2)
BUN: 12 mg/dL (ref 6–23)
CHLORIDE: 105 meq/L (ref 96–112)
CO2: 23 meq/L (ref 19–32)
CREATININE: 0.81 mg/dL (ref 0.50–1.10)
Calcium: 9.7 mg/dL (ref 8.4–10.5)
Glucose, Bld: 101 mg/dL — ABNORMAL HIGH (ref 70–99)
POTASSIUM: 4.3 meq/L (ref 3.5–5.3)
Sodium: 140 mEq/L (ref 135–145)
Total Protein: 6.9 g/dL (ref 6.0–8.3)

## 2014-10-23 MED ORDER — CYCLOBENZAPRINE HCL 10 MG PO TABS: 10.0000 mg | ORAL_TABLET | Freq: Three times a day (TID) | ORAL | Status: DC | PRN

## 2014-10-23 NOTE — Assessment & Plan Note (Signed)
Continue with paxil and pt will try black cohosh again

## 2014-10-23 NOTE — Patient Instructions (Signed)
Okay to try the black cohosh Continue current medications We will call with lab results Pneumonia vaccine F/U 6 months

## 2014-10-23 NOTE — Assessment & Plan Note (Signed)
Well controlled, no change to meds Check fasting labs

## 2014-10-23 NOTE — Progress Notes (Signed)
Patient ID: Mary Wilkinson, female   DOB: 10-22-1945, 69 y.o.   MRN: [id number] Subjective:   Patient presents for Medicare Annual/Subsequent preventive examination.    No specific concerns, continues to have hot flashes, despite paxil and using Isoflaven currnetly, wants to try black co hosh again. Was on hormones in the past does not want to restart.   OA knees- working on home doing improvements, more active than usual, no swelling of joints, but soreness, takes tylenol as needed  Meds reviewed  Due for pneumonia vaccine   Review Past Medical/Family/Social: per EMR   Risk Factors  Current exercise habits: Walks Dietary issues discussed:  yes  Cardiac risk factors:HTN.   Depression Screen  (Note: if answer to either of the following is "Yes", a more complete depression screening is indicated)  Over the past two weeks, have you felt down, depressed or hopeless? No Over the past two weeks, have you felt little interest or pleasure in doing things? No Have you lost interest or pleasure in daily life? No Do you often feel hopeless? No Do you cry easily over simple problems? No   Activities of Daily Living  In your present state of health, do you have any difficulty performing the following activities?:  Driving? No  Managing money? No  Feeding yourself? No  Getting from bed to chair? No  Climbing a flight of stairs? No  Preparing food and eating?: No  Bathing or showering? No  Getting dressed: No  Getting to the toilet? No  Using the toilet:No  Moving around from place to place: No  In the past year have you fallen or had a near fall?:No  Are you sexually active? No  Do you have more than one partner? No   Hearing Difficulties: No  Do you often ask people to speak up or repeat themselves? No  Do you experience ringing or noises in your ears? No Do you have difficulty understanding soft or whispered voices? yes  Do you feel that you have a problem with memory? No Do you  often misplace items? Sometimes Do you feel safe at home? Yes  Cognitive Testing  Alert? Yes Normal Appearance?Yes  Oriented to person? Yes Place? Yes  Time? Yes  Recall of three objects? Yes  Can perform simple calculations? Yes  Displays appropriate judgment?Yes  Can read the correct time from a watch face?Yes   List the Names of Other Physician/Practitioners you currently use:    Eye doctor   Screening Tests / Date Colonoscopy    UTD                 Zostavax  Mammogram - utd Influenza Vaccine utd   Ros: GEN- denies fatigue, fever, weight loss,weakness, recent illness HEENT- denies eye drainage, change in vision, nasal discharge, CVS- denies chest pain, palpitations RESP- denies SOB, cough, wheeze ABD- denies N/V, change in stools, abd pain GU- denies dysuria, hematuria, dribbling, incontinence MSK- + joint pain, muscle aches, injury Neuro- denies headache, dizziness, syncope, seizure activity  PHYSICAL: GEN- NAD, alert and oriented x3 HEENT- PERRL, EOMI, non injected sclera, pink conjunctiva, MMM, oropharynx clear Neck- Supple, no thryomegaly, no bruit CVS- RRR, no murmur RESP-CTAB EXT- No edema Pulses- Radial, DP- 2+      Assessment:    Annual wellness medicare exam   Plan:    During the course of the visit the patient was educated and counseled about appropriate screening and preventive services including:  Declines shingles vaccine  pneumonia  vaccine given  Diet review for nutrition referral? Yes ____ Not Indicated __x__  Patient Instructions (the written plan) was given to the patient.  Medicare Attestation  I have personally reviewed:  The patient's medical and social history  Their use of alcohol, tobacco or illicit drugs  Their current medications and supplements  The patient's functional ability including ADLs,fall risks, home safety risks, cognitive, and hearing and visual impairment  Diet and physical activities  Evidence for depression or  mood disorders  The patient's weight, height, BMI, and visual acuity have been recorded in the chart. I have made referrals, counseling, and provided education to the patient based on review of the above and I have provided the patient with a written personalized care plan for preventive services.

## 2014-10-23 NOTE — Assessment & Plan Note (Signed)
Flares of joint pain due to increased activity, okay to take tylenol arthritis as needed

## 2014-10-24 ENCOUNTER — Encounter: Payer: Self-pay | Admitting: *Deleted

## 2014-10-30 ENCOUNTER — Other Ambulatory Visit: Payer: Self-pay | Admitting: Family Medicine

## 2014-10-31 NOTE — Telephone Encounter (Signed)
Medication refilled per protocol. 

## 2015-01-07 ENCOUNTER — Other Ambulatory Visit: Payer: Self-pay | Admitting: Family Medicine

## 2015-01-08 NOTE — Telephone Encounter (Signed)
Medication refilled per protocol. 

## 2015-01-10 ENCOUNTER — Other Ambulatory Visit: Payer: Self-pay | Admitting: Family Medicine

## 2015-01-10 NOTE — Telephone Encounter (Signed)
Medication refilled per protocol. 

## 2015-02-02 ENCOUNTER — Other Ambulatory Visit: Payer: Self-pay | Admitting: Family Medicine

## 2015-02-03 NOTE — Telephone Encounter (Signed)
Refill appropriate and filled per protocol. 

## 2015-02-26 ENCOUNTER — Other Ambulatory Visit: Payer: Self-pay | Admitting: Family Medicine

## 2015-02-27 NOTE — Telephone Encounter (Signed)
Refill appropriate and filled per protocol. 

## 2015-04-07 DIAGNOSIS — H40033 Anatomical narrow angle, bilateral: Secondary | ICD-10-CM | POA: Diagnosis not present

## 2015-04-07 DIAGNOSIS — H2513 Age-related nuclear cataract, bilateral: Secondary | ICD-10-CM | POA: Diagnosis not present

## 2015-04-09 ENCOUNTER — Other Ambulatory Visit: Payer: Self-pay | Admitting: Family Medicine

## 2015-04-09 DIAGNOSIS — Z1231 Encounter for screening mammogram for malignant neoplasm of breast: Secondary | ICD-10-CM

## 2015-04-28 ENCOUNTER — Encounter: Payer: Self-pay | Admitting: Family Medicine

## 2015-04-28 ENCOUNTER — Ambulatory Visit (INDEPENDENT_AMBULATORY_CARE_PROVIDER_SITE_OTHER): Payer: BC Managed Care – PPO | Admitting: Family Medicine

## 2015-04-28 VITALS — BP 126/72 | HR 76 | Temp 98.4°F | Resp 16 | Ht 61.0 in | Wt 165.0 lb

## 2015-04-28 DIAGNOSIS — E785 Hyperlipidemia, unspecified: Secondary | ICD-10-CM | POA: Diagnosis not present

## 2015-04-28 DIAGNOSIS — E663 Overweight: Secondary | ICD-10-CM

## 2015-04-28 DIAGNOSIS — J302 Other seasonal allergic rhinitis: Secondary | ICD-10-CM

## 2015-04-28 DIAGNOSIS — I10 Essential (primary) hypertension: Secondary | ICD-10-CM

## 2015-04-28 NOTE — Progress Notes (Signed)
Patient ID: Mary FelixHester M. Rana Wilkinson, female   DOB: January 29, 1946, 69 y.o.   MRN: [id number]   Subjective:    Patient ID: Mary FelixHester M. Rana Wilkinson, female    DOB: January 29, 1946, 69 y.o.   MRN: [id number]  Patient presents for Follow-up  issue here for follow-up. She states she is doing fairly well. She continues to get some hot flashes she actually stopped the black cohosh but does take the extended release Paxil. She's not had any problems with her other medications. Occasionally she gets some ear popping as well as some sinus pressure and runny nose especially the past couple weeks. She's not tried anything over-the-counter.    Review Of Systems:  GEN- denies fatigue, fever, weight loss,weakness, recent illness HEENT- denies eye drainage, change in vision, +nasal discharge, CVS- denies chest pain, palpitations RESP- denies SOB, cough, wheeze ABD- denies N/V, change in stools, abd pain GU- denies dysuria, hematuria, dribbling, incontinence MSK- denies joint pain, muscle aches, injury Neuro- denies headache, dizziness, syncope, seizure activity       Objective:    BP 126/72 mmHg  Pulse 76  Temp(Src) 98.4 F (36.9 C)  Resp 16  Ht 5\' 1"  (1.549 m)  Wt 165 lb (74.844 kg)  BMI 31.19 kg/m2 GEN- NAD, alert and oriented x3 HEENT- PERRL, EOMI, non injected sclera, pink conjunctiva, MMM, oropharynx clear, nares clear rhinorrhea, TM clear no effusion, canal clear  Neck- Supple, noLAD CVS- RRR, no murmur RESP-CTAB EXT- No edema Pulses- Radial  2+        Assessment & Plan:      Problem List Items Addressed This Visit    Overweight   Hyperlipidemia   HTN (hypertension) - Primary    Other Visit Diagnoses    Seasonal allergies        Trial of anti-histamine, flonase       Note: This dictation was prepared with Dragon dictation along with smaller phrase technology. Any transcriptional errors that result from this process are unintentional.

## 2015-04-28 NOTE — Patient Instructions (Signed)
Try the zyrtec and claritin  Add Flonase as needed Continue current medications F/U 6 months -Physical

## 2015-04-28 NOTE — Assessment & Plan Note (Signed)
The pressure is well-controlled and changed her medication her fasting labs were done within the past 6 months and cholesterol was at goal we'll recheck these at her next follow-up visit.

## 2015-05-05 ENCOUNTER — Ambulatory Visit (HOSPITAL_COMMUNITY)
Admission: RE | Admit: 2015-05-05 | Discharge: 2015-05-05 | Disposition: A | Discharge status: Home / Self Care | Payer: Medicare Other | Source: Ambulatory Visit | Attending: Family Medicine | Admitting: Family Medicine

## 2015-05-05 DIAGNOSIS — Z1231 Encounter for screening mammogram for malignant neoplasm of breast: Secondary | ICD-10-CM | POA: Insufficient documentation

## 2015-07-07 ENCOUNTER — Telehealth: Payer: Self-pay | Admitting: Family Medicine

## 2015-07-07 NOTE — Telephone Encounter (Signed)
Please let patient know her only other option is the actual estrogen hormone. Which we had discussed before and she had declined. She would like to try a low dose estrogen Send over Estrace 0.5mg  daily

## 2015-07-07 NOTE — Telephone Encounter (Signed)
Call placed to patient to inquire.   Reports that she was started on Paroxetine for hot flashes, but the medication is not effective, especially at night.   Requested MD to recommend if any other treatment is available.   MD please advise.

## 2015-07-07 NOTE — Telephone Encounter (Signed)
Patient calling to speak to you regarding her  paroxetin Please call her at [phone]

## 2015-07-08 MED ORDER — ESTRADIOL 0.5 MG PO TABS: 0.5000 mg | ORAL_TABLET | Freq: Every day | ORAL | Status: DC

## 2015-07-08 NOTE — Telephone Encounter (Signed)
Call placed to patient.   States that she would like to try Estrace.   Prescription sent to pharmacy.

## 2015-07-23 ENCOUNTER — Ambulatory Visit (INDEPENDENT_AMBULATORY_CARE_PROVIDER_SITE_OTHER): Payer: BC Managed Care – PPO | Admitting: Physician Assistant

## 2015-07-23 ENCOUNTER — Encounter: Payer: Self-pay | Admitting: Physician Assistant

## 2015-07-23 ENCOUNTER — Other Ambulatory Visit: Payer: Self-pay | Admitting: Family Medicine

## 2015-07-23 VITALS — BP 156/86 | HR 80 | Temp 98.0°F | Resp 18 | Wt 168.0 lb

## 2015-07-23 DIAGNOSIS — M25551 Pain in right hip: Secondary | ICD-10-CM | POA: Diagnosis not present

## 2015-07-23 MED ORDER — MELOXICAM 7.5 MG PO TABS: 7.5000 mg | ORAL_TABLET | Freq: Every day | ORAL | Status: DC

## 2015-07-23 NOTE — Progress Notes (Signed)
Patient ID: Mary Wilkinson. Zahradnik MRN: [id number], DOB: 11/02/1945, 69 y.o. Date of Encounter: 07/23/2015, 3:21 PM    Chief Complaint:  Chief Complaint  Patient presents with  . OTHER    right groin pain     HPI: 70 y.o. year old female presents with above.   Says that she first felt this discomfort Monday evening which was 07/21/15. Says that she started feeling it when she got in bed.  Says that she felt discomfort along the anterior aspect of her right hip joint.  Says that then she just laid flat on her back until she fell asleep.  Says that she had no problems with the pain during sleep. Says that all during the day Tuesday she felt fine and never felt the discomfort in that region. Says that again Tuesday night when she got in bed she felt the discomfort in the same area again when she was lying down trying to sleep. Again she laid on her back and the pain resolved and she fell asleep and was not awoken with the discomfort at all. Says that this morning when she started doing things around the house she started noticing the discomfort again.  Pain has not been in her abdomen. She has had no change in bowel habits. No diarrhea. No constipation.  She states that she has never had hip pain before or problems with pain in this area in the past. Asked what her activity has been recently. She says that she has been painting a room inside the house recently. Last week painted using the roller. On Tuesday of this week painted with a brush. Says that on Sunday of this week she rested. On Monday which was 07/21/15 she did "odds and ends "around the house.     Home Meds:   Outpatient Prescriptions Prior to Visit  Medication Sig Dispense Refill  . aspirin 325 MG EC tablet Take 325 mg by mouth daily.    Marland Kitchen atorvastatin (LIPITOR) 40 MG tablet TAKE ONE TABLET BY MOUTH ONE TIME DAILY 90 tablet 2  . Calcium Carbonate-Vit D-Min (CALCIUM 600 + MINERALS) 600-200 MG-UNIT TABS Take 1 tablet by mouth  daily.    . cyclobenzaprine (FLEXERIL) 10 MG tablet Take 1 tablet (10 mg total) by mouth 3 (three) times daily as needed. for muscle spams 60 tablet 1  . estradiol (ESTRACE) 0.5 MG tablet Take 1 tablet (0.5 mg total) by mouth daily. 30 tablet 1  . gabapentin (NEURONTIN) 300 MG capsule Take 1 capsule (300 mg total) by mouth at bedtime. 30 capsule 6  . lisinopril (PRINIVIL,ZESTRIL) 40 MG tablet TAKE ONE TABLET BY MOUTH ONE TIME DAILY 30 tablet 4  . Omega-3 Fatty Acids (FISH OIL PO) Take 1 tablet by mouth daily.      . vitamin B-12 (CYANOCOBALAMIN) 100 MCG tablet Take 50 mcg by mouth daily.     No facility-administered medications prior to visit.    Allergies: No Known Allergies    Review of Systems: See HPI for pertinent ROS. All other ROS negative.    Physical Exam: Blood pressure 156/86, pulse 80, temperature 98 F (36.7 C), temperature source Oral, resp. rate 18, weight 168 lb (76.204 kg)., Body mass index is 31.76 kg/(m^2). General:  Overweight  AAF. Appears in no acute distress. Neck: Supple. No thyromegaly. No lymphadenopathy. Lungs: Clear bilaterally to auscultation without wheezes, rales, or rhonchi. Breathing is unlabored. Heart: Regular rhythm. No murmurs, rubs, or gallops. Abdomen: Soft, non-tender, non-distended with normoactive bowel  sounds. No hepatomegaly. No rebound/guarding. No obvious abdominal masses. Msk:  Strength and tone normal for age. No tenderness reproduced with palpation. She has full range of motion in the hip joint. Extremities/Skin: Warm and dry. Neuro: Alert and oriented X 3. Moves all extremities spontaneously. Gait is normal. CNII-XII grossly in tact. Psych:  Responds to questions appropriately with a normal affect.     ASSESSMENT AND PLAN:  70 y.o. year old female with  1. Right hip pain Suspect that her pain is secondary to arthritis in the hip joint.  Discussed going ahead and obtaining x-ray of the hip. Patient prefers to try some treatment  first and obtain the x-ray if symptoms do not resolve. She is to take the Mobic daily with food. If pain worsens or changes, then follow-up immediately. Otherwise, continue the Mobic daily for 1 week and if symptoms not resolved at that time, then follow-up. Renal function normal on lab 10/23/14. - meloxicam (MOBIC) 7.5 MG tablet; Take 1 tablet (7.5 mg total) by mouth daily.  Dispense: 30 tablet; Refill: 0   Signed, [address] [place], [place], [facility] 07/23/2015 3:21 PM

## 2015-07-24 NOTE — Telephone Encounter (Signed)
Ok to refill 

## 2015-07-25 NOTE — Telephone Encounter (Signed)
Prescription sent to pharmacy.

## 2015-07-25 NOTE — Telephone Encounter (Signed)
okay

## 2015-07-28 ENCOUNTER — Other Ambulatory Visit: Payer: Self-pay | Admitting: Family Medicine

## 2015-07-28 NOTE — Telephone Encounter (Signed)
Both sent to [facility] pharmacy on Friday.  Trying to reach pharmacy, busy

## 2015-07-28 NOTE — Telephone Encounter (Signed)
[facility] get [facility] from [facility]  Refills resent as ordered on friday

## 2015-09-02 ENCOUNTER — Other Ambulatory Visit: Payer: Self-pay | Admitting: *Deleted

## 2015-09-02 MED ORDER — ATORVASTATIN CALCIUM 40 MG PO TABS: 40.0000 mg | ORAL_TABLET | Freq: Every day | ORAL | Status: DC

## 2015-09-02 MED ORDER — LISINOPRIL 40 MG PO TABS: 40.0000 mg | ORAL_TABLET | Freq: Every day | ORAL | Status: DC

## 2015-09-02 MED ORDER — ESTRADIOL 0.5 MG PO TABS: 0.5000 mg | ORAL_TABLET | Freq: Every day | ORAL | Status: DC

## 2015-09-02 NOTE — Telephone Encounter (Signed)
Received fax requesting refill on Lipitor, Lisinopril, and Estrace.   Refill appropriate and filled per protocol.

## 2015-10-29 ENCOUNTER — Encounter: Payer: Self-pay | Admitting: Family Medicine

## 2015-10-29 ENCOUNTER — Ambulatory Visit (INDEPENDENT_AMBULATORY_CARE_PROVIDER_SITE_OTHER): Payer: Medicare Other | Admitting: Family Medicine

## 2015-10-29 VITALS — BP 136/70 | HR 68 | Temp 98.4°F | Resp 14 | Ht 61.0 in | Wt 168.0 lb

## 2015-10-29 DIAGNOSIS — E785 Hyperlipidemia, unspecified: Secondary | ICD-10-CM

## 2015-10-29 DIAGNOSIS — I10 Essential (primary) hypertension: Secondary | ICD-10-CM | POA: Diagnosis not present

## 2015-10-29 DIAGNOSIS — Z0000 Encounter for general adult medical examination without abnormal findings: Secondary | ICD-10-CM

## 2015-10-29 DIAGNOSIS — Z780 Asymptomatic menopausal state: Secondary | ICD-10-CM

## 2015-10-29 DIAGNOSIS — N951 Menopausal and female climacteric states: Secondary | ICD-10-CM

## 2015-10-29 LAB — CBC WITH DIFFERENTIAL/PLATELET
BASOS PCT: 0 %
Basophils Absolute: 0 cells/uL (ref 0–200)
Eosinophils Absolute: 82 cells/uL (ref 15–500)
Eosinophils Relative: 2 %
HCT: 40.2 % (ref 35.0–45.0)
HEMOGLOBIN: 12.9 g/dL (ref 12.0–15.0)
LYMPHS ABS: 1763 {cells}/uL (ref 850–3900)
Lymphocytes Relative: 43 %
MCH: 26.7 pg — ABNORMAL LOW (ref 27.0–33.0)
MCHC: 32.1 g/dL (ref 32.0–36.0)
MCV: 83.2 fL (ref 80.0–100.0)
MPV: 9.4 fL (ref 7.5–12.5)
Monocytes Absolute: 369 cells/uL (ref 200–950)
Monocytes Relative: 9 %
Neutro Abs: 1886 cells/uL (ref 1500–7800)
Neutrophils Relative %: 46 %
Platelets: 261 10*3/uL (ref 140–400)
RBC: 4.83 MIL/uL (ref 3.80–5.10)
RDW: 14.3 % (ref 11.0–15.0)
WBC: 4.1 10*3/uL (ref 3.8–10.8)

## 2015-10-29 LAB — COMPREHENSIVE METABOLIC PANEL
ALBUMIN: 4.3 g/dL (ref 3.6–5.1)
ALK PHOS: 76 U/L (ref 33–130)
ALT: 24 U/L (ref 6–29)
AST: 26 U/L (ref 10–35)
BILIRUBIN TOTAL: 0.6 mg/dL (ref 0.2–1.2)
BUN: 12 mg/dL (ref 7–25)
CALCIUM: 9.7 mg/dL (ref 8.6–10.4)
CO2: 26 mmol/L (ref 20–31)
CREATININE: 0.91 mg/dL (ref 0.50–0.99)
Chloride: 106 mmol/L (ref 98–110)
GLUCOSE: 101 mg/dL — AB (ref 70–99)
Potassium: 4.3 mmol/L (ref 3.5–5.3)
Sodium: 139 mmol/L (ref 135–146)
Total Protein: 7 g/dL (ref 6.1–8.1)

## 2015-10-29 LAB — LIPID PANEL
Cholesterol: 183 mg/dL (ref 125–200)
HDL: 66 mg/dL (ref >=46)
LDL Cholesterol: 95 mg/dL (ref <130)
TRIGLYCERIDES: 112 mg/dL (ref <150)
Total CHOL/HDL Ratio: 2.8 Ratio (ref <=5.0)
VLDL: 22 mg/dL (ref <30)

## 2015-10-29 NOTE — Assessment & Plan Note (Signed)
Medicare CPE, immunizations UTD, declines shingles Prevention UTD

## 2015-10-29 NOTE — Progress Notes (Signed)
Patient ID: Mary Wilkinson, female   DOB: Oct 04, 1945, 70 y.o.   MRN: [id number]  Subjective:   Patient presents for Medicare Annual/Subsequent preventive examination.   She here for area wellness exam she has no specific concerns or medications were reviewed she is due for fasting labs today. Review Past Medical/Family/Social: per EMR   Risk Factors  Current exercise habits: walks Dietary issues discussed: Yes  Cardiac risk factors: Obesity (BMI >= 30 kg/m2). HTN , Hyperlipidemia   Depression Screen  (Note: if answer to either of the following is "Yes", a more complete depression screening is indicated)  Over the past two weeks, have you felt down, depressed or hopeless? No Over the past two weeks, have you felt little interest or pleasure in doing things? No Have you lost interest or pleasure in daily life? No Do you often feel hopeless? No Do you cry easily over simple problems? No   Activities of Daily Living  In your present state of health, do you have any difficulty performing the following activities?:  Driving? No  Managing money? No  Feeding yourself? No  Getting from bed to chair? No  Climbing a flight of stairs? No  Preparing food and eating?: No  Bathing or showering? No  Getting dressed: No  Getting to the toilet? No  Using the toilet:No  Moving around from place to place: No  In the past year have you fallen or had a near fall?:No  Are you sexually active? No  Do you have more than one partner? No   Hearing Difficulties: No  Do you often ask people to speak up or repeat themselves? No  Do you experience ringing or noises in your ears? Sometimes  Do you have difficulty understanding soft or whispered voices? No  Do you feel that you have a problem with memory? Yes  Do you often misplace items? Yes- keys, eye glasses  Do you feel safe at home? Yes  Cognitive Testing  Alert? Yes Normal Appearance?Yes  Oriented to person? Yes Place? Yes  Time? Yes  Recall of  three objects? Yes  Can perform simple calculations? Yes  Displays appropriate judgment?Yes  Can read the correct time from a watch face?Yes   List the Names of Other Physician/Practitioners you currently use:  Eye doctor  Screening Tests / Date Colonoscopy - UTD                 Zostavax Declines  Mammogram  UTD  Influenza Vaccine Declines  Tetanus/tdap UTD   ROS:  GEN- denies fatigue, fever, weight loss,weakness, recent illness HEENT- denies eye drainage, change in vision, nasal discharge, CVS- denies chest pain, palpitations RESP- denies SOB, cough, wheeze ABD- denies N/V, change in stools, abd pain GU- denies dysuria, hematuria, dribbling, incontinence MSK- denies joint pain, muscle aches, injury Neuro- denies headache, dizziness, syncope, seizure activity  PHYSICAL: GEN- NAD, alert and oriented x3 HEENT- PERRL, EOMI, non injected sclera, pink conjunctiva, MMM, oropharynx clear Neck- Supple, no thryomegaly CVS- RRR, no murmur RESP-CTAB ABD-NABS,soft,NTND  EXT- No edema Neuro- normal mentation, normal recall  Pulses- Radial, DP- 2+    Assessment:    Annual wellness medicare exam   Plan:    During the course of the visit the patient was educated and counseled about appropriate screening and preventive services including:    Shingles vaccine.-Declines Screen NEG for depression.  Diet review for nutrition referral? Yes ____ Not Indicated __x__  - Discussed reducing carbs  Patient Instructions (the written plan)  was given to the patient.  Medicare Attestation  I have personally reviewed:  The patient's medical and social history  Their use of alcohol, tobacco or illicit drugs  Their current medications and supplements  The patient's functional ability including ADLs,fall risks, home safety risks, cognitive, and hearing and visual impairment  Diet and physical activities  Evidence for depression or mood disorders  The patient's weight, height, BMI, and visual  acuity have been recorded in the chart. I have made referrals, counseling, and provided education to the patient based on review of the above and I have provided the patient with a written personalized care plan for preventive services.

## 2015-10-29 NOTE — Assessment & Plan Note (Signed)
Continue low dose estrogen for now

## 2015-10-29 NOTE — Patient Instructions (Signed)
F/u 6 MONTHS  Continue current medications

## 2015-10-29 NOTE — Assessment & Plan Note (Signed)
Well controlled, she is on estrogen tolerating without and difficulty

## 2015-11-03 ENCOUNTER — Encounter: Payer: Self-pay | Admitting: *Deleted

## 2016-03-05 DIAGNOSIS — H40033 Anatomical narrow angle, bilateral: Secondary | ICD-10-CM | POA: Diagnosis not present

## 2016-03-05 DIAGNOSIS — H2513 Age-related nuclear cataract, bilateral: Secondary | ICD-10-CM | POA: Diagnosis not present

## 2016-03-17 DIAGNOSIS — H25812 Combined forms of age-related cataract, left eye: Secondary | ICD-10-CM | POA: Diagnosis not present

## 2016-03-17 DIAGNOSIS — H25811 Combined forms of age-related cataract, right eye: Secondary | ICD-10-CM | POA: Diagnosis not present

## 2016-03-19 ENCOUNTER — Telehealth: Payer: Self-pay | Admitting: *Deleted

## 2016-03-19 NOTE — Telephone Encounter (Signed)
Received call from patient.   Reports that she was charged for her labs obtained in 10/2015. Reports that insurance did not cover expense.   Patient requesting to know why insurance did not cover.   Please contact patient at [phone].

## 2016-03-19 NOTE — Telephone Encounter (Signed)
Send to office manager  

## 2016-03-22 DIAGNOSIS — H25812 Combined forms of age-related cataract, left eye: Secondary | ICD-10-CM | POA: Diagnosis not present

## 2016-03-22 DIAGNOSIS — H2512 Age-related nuclear cataract, left eye: Secondary | ICD-10-CM | POA: Diagnosis not present

## 2016-03-22 DIAGNOSIS — Z961 Presence of intraocular lens: Secondary | ICD-10-CM | POA: Diagnosis not present

## 2016-04-07 ENCOUNTER — Other Ambulatory Visit: Payer: Self-pay | Admitting: Family Medicine

## 2016-04-07 DIAGNOSIS — Z1231 Encounter for screening mammogram for malignant neoplasm of breast: Secondary | ICD-10-CM

## 2016-04-29 DIAGNOSIS — H25811 Combined forms of age-related cataract, right eye: Secondary | ICD-10-CM | POA: Diagnosis not present

## 2016-04-29 DIAGNOSIS — H2511 Age-related nuclear cataract, right eye: Secondary | ICD-10-CM | POA: Diagnosis not present

## 2016-04-29 DIAGNOSIS — Z961 Presence of intraocular lens: Secondary | ICD-10-CM | POA: Diagnosis not present

## 2016-05-10 ENCOUNTER — Ambulatory Visit (HOSPITAL_COMMUNITY)
Admission: RE | Admit: 2016-05-10 | Discharge: 2016-05-10 | Disposition: A | Discharge status: Home / Self Care | Payer: Medicare Other | Source: Ambulatory Visit | Attending: Family Medicine | Admitting: Family Medicine

## 2016-05-10 DIAGNOSIS — Z1231 Encounter for screening mammogram for malignant neoplasm of breast: Secondary | ICD-10-CM

## 2016-05-12 ENCOUNTER — Ambulatory Visit (INDEPENDENT_AMBULATORY_CARE_PROVIDER_SITE_OTHER): Payer: BC Managed Care – PPO | Admitting: Family Medicine

## 2016-05-12 ENCOUNTER — Encounter: Payer: Self-pay | Admitting: Family Medicine

## 2016-05-12 VITALS — BP 140/80 | HR 60 | Temp 98.1°F | Resp 14 | Ht <= 58 in | Wt 159.0 lb

## 2016-05-12 DIAGNOSIS — A084 Viral intestinal infection, unspecified: Secondary | ICD-10-CM | POA: Diagnosis not present

## 2016-05-12 DIAGNOSIS — E7800 Pure hypercholesterolemia, unspecified: Secondary | ICD-10-CM | POA: Diagnosis not present

## 2016-05-12 DIAGNOSIS — I10 Essential (primary) hypertension: Secondary | ICD-10-CM | POA: Diagnosis not present

## 2016-05-12 LAB — CBC WITH DIFFERENTIAL/PLATELET
BASOS PCT: 0 %
Basophils Absolute: 0 cells/uL (ref 0–200)
EOS PCT: 1 %
Eosinophils Absolute: 46 cells/uL (ref 15–500)
HCT: 38 % (ref 35.0–45.0)
Hemoglobin: 12.1 g/dL (ref 12.0–15.0)
LYMPHS PCT: 39 %
Lymphs Abs: 1794 cells/uL (ref 850–3900)
MCH: 27.2 pg (ref 27.0–33.0)
MCHC: 31.8 g/dL — ABNORMAL LOW (ref 32.0–36.0)
MCV: 85.4 fL (ref 80.0–100.0)
MONOS PCT: 7 %
MPV: 9.9 fL (ref 7.5–12.5)
Monocytes Absolute: 322 cells/uL (ref 200–950)
Neutro Abs: 2438 cells/uL (ref 1500–7800)
Neutrophils Relative %: 53 %
PLATELETS: 257 10*3/uL (ref 140–400)
RBC: 4.45 MIL/uL (ref 3.80–5.10)
RDW: 14.2 % (ref 11.0–15.0)
WBC: 4.6 10*3/uL (ref 3.8–10.8)

## 2016-05-12 LAB — BASIC METABOLIC PANEL
BUN: 8 mg/dL (ref 7–25)
CALCIUM: 9.2 mg/dL (ref 8.6–10.4)
CHLORIDE: 107 mmol/L (ref 98–110)
CO2: 27 mmol/L (ref 20–31)
CREATININE: 0.82 mg/dL (ref 0.50–0.99)
Glucose, Bld: 85 mg/dL (ref 70–99)
Potassium: 4 mmol/L (ref 3.5–5.3)
Sodium: 140 mmol/L (ref 135–146)

## 2016-05-12 MED ORDER — CYCLOBENZAPRINE HCL 10 MG PO TABS: ORAL_TABLET | ORAL | 0 refills | Status: DC

## 2016-05-12 NOTE — Patient Instructions (Signed)
Check your blood pressure Bland foods, continue powerade  F/U 6 months for Physical

## 2016-05-12 NOTE — Assessment & Plan Note (Signed)
Cholesterol is at goal no change to medication

## 2016-05-12 NOTE — Progress Notes (Signed)
   Subjective:    Patient ID: Mary Wilkinson, female    DOB: 08-18-1945, 70 y.o.   MRN: [id number]  Patient presents for Medication Management (Pt NOT fasting) and Medication Refill  Issue to follow-up medications. For the past 4 days she's had some abdominal discomfort. She initially had diarrhea on Friday and Saturday along with decreased appetite. That is now improved but she still gets some mild cramping and gassiness. She is not sure if it was something that she ate on Thanksgiving. She's not had any vomiting. She has been tolerating fluids and has been eating bland foods mostly bread and crackers.  Hot flashes C is still using that estradiol it helps some but she still has breakthrough flashes. Next  Hypertension she's been taking her medicine as prescribed she has not checked her blood pressure at home since she has not been feeling well.  Recently had bilateral cataracts done   Review Of Systems:  GEN- denies fatigue, fever, weight loss,weakness, recent illness HEENT- denies eye drainage, change in vision, nasal discharge, CVS- denies chest pain, palpitations RESP- denies SOB, cough, wheeze ABD- denies N/V, +change in stools, abd pain GU- denies dysuria, hematuria, dribbling, incontinence MSK- denies joint pain, muscle aches, injury Neuro- denies headache, dizziness, syncope, seizure activity       Objective:    BP 140/80   Pulse 60   Temp 98.1 F (36.7 C) (Oral)   Resp 14   Ht 4\' 8"  (1.422 m)   Wt 159 lb (72.1 kg)   BMI 35.65 kg/m  GEN- NAD, alert and oriented x3, non toxic appearing  HEENT- PERRL, EOMI, non injected sclera, pink conjunctiva, MMM, oropharynx clear Neck- Supple, no thyromegaly CVS- RRR, no murmur RESP-CTAB ABD-NABS,soft,NT, ND  EXT- No edema Pulses- Radial 2+        Assessment & Plan:      Problem List Items Addressed This Visit    Hyperlipidemia    Cholesterol is at goal no change to medication      HTN (hypertension)    Blood  pressures at the upper end of normal. I will have her check this at home over the next 2 weeks as she has been sick recently. Her blood pressure is still elevated then we'll make adjustments to her medication Goals to keep her blood pressure less than 140/90      Relevant Orders   Basic metabolic panel   CBC with Differential/Platelet    Other Visit Diagnoses    Viral gastroenteritis    -  Primary   Likley viral possible food poisoning but no N/V. now pain and diarrhe resolved, slowly advance diet , check lytes renal function       Note: This dictation was prepared with Dragon dictation along with smaller phrase technology. Any transcriptional errors that result from this process are unintentional.

## 2016-05-12 NOTE — Assessment & Plan Note (Signed)
Blood pressures at the upper end of normal. I will have her check this at home over the next 2 weeks as she has been sick recently. Her blood pressure is still elevated then we'll make adjustments to her medication Goals to keep her blood pressure less than 140/90

## 2016-05-14 ENCOUNTER — Encounter: Payer: Self-pay | Admitting: *Deleted

## 2016-05-19 ENCOUNTER — Other Ambulatory Visit: Payer: Self-pay | Admitting: Family Medicine

## 2016-05-27 ENCOUNTER — Telehealth: Payer: Self-pay | Admitting: *Deleted

## 2016-05-27 NOTE — Telephone Encounter (Signed)
Patient report that she has been taking Lisinopril as prescribed.

## 2016-05-27 NOTE — Telephone Encounter (Signed)
Received call from patient.   Reports BP readings are as follows: 11/30  159/84  138/80 12/1  135/ 79 12/2  142/84 12/3  139/80 12/4  133/70 12/5  121/71 12/6  138/81 12/7  125/75 12/8  116/69 12/9  119/74 12/10  116/67 12/11  116/65 12/12  116/68  12/13  128/78  MD to be made aware.

## 2016-05-28 NOTE — Telephone Encounter (Signed)
Call placed to patient and patient made aware.  

## 2016-05-28 NOTE — Telephone Encounter (Signed)
Blood presure looks good no change to dose

## 2016-06-05 ENCOUNTER — Other Ambulatory Visit: Payer: Self-pay | Admitting: Family Medicine

## 2016-07-09 ENCOUNTER — Other Ambulatory Visit: Payer: Self-pay | Admitting: Family Medicine

## 2016-07-09 NOTE — Telephone Encounter (Signed)
Rx filled per protocol  

## 2016-08-04 DIAGNOSIS — Z961 Presence of intraocular lens: Secondary | ICD-10-CM | POA: Diagnosis not present

## 2016-08-04 DIAGNOSIS — H43813 Vitreous degeneration, bilateral: Secondary | ICD-10-CM | POA: Diagnosis not present

## 2016-09-01 DIAGNOSIS — H43813 Vitreous degeneration, bilateral: Secondary | ICD-10-CM | POA: Diagnosis not present

## 2016-11-09 ENCOUNTER — Encounter: Payer: Self-pay | Admitting: Family Medicine

## 2016-11-09 ENCOUNTER — Ambulatory Visit (INDEPENDENT_AMBULATORY_CARE_PROVIDER_SITE_OTHER): Payer: Medicare Other | Admitting: Family Medicine

## 2016-11-09 VITALS — BP 132/68 | HR 62 | Temp 98.6°F | Resp 16 | Ht <= 58 in | Wt 162.0 lb

## 2016-11-09 DIAGNOSIS — I10 Essential (primary) hypertension: Secondary | ICD-10-CM

## 2016-11-09 DIAGNOSIS — E7800 Pure hypercholesterolemia, unspecified: Secondary | ICD-10-CM

## 2016-11-09 DIAGNOSIS — G5603 Carpal tunnel syndrome, bilateral upper limbs: Secondary | ICD-10-CM | POA: Diagnosis not present

## 2016-11-09 DIAGNOSIS — Z0000 Encounter for general adult medical examination without abnormal findings: Secondary | ICD-10-CM | POA: Diagnosis not present

## 2016-11-09 NOTE — Patient Instructions (Addendum)
Use wrist brace for left hand We will call with results Get your mammogram in November F/U 1 year for Physical

## 2016-11-09 NOTE — Progress Notes (Signed)
Subjective:   Patient presents for Medicare Annual/Subsequent preventive examination.   Here for Wellness, medications reviewed Fasting labs  To be done  She still gets occasional tingling numbness in her first 3 digits on her left hand. She is on carpal tunnel syndrome on the right hand. She notices states it happened after she would eat but it is very short-lived. She denies any swelling in her hands denies any neck pain. Review Past Medical/Family/Social: Per EMR    Risk Factors  Current exercise habits: walks Dietary issues discussed: yes  Cardiac risk factors: Obesity (BMI >= 30 kg/m2). HTN, Hyperlipidemia   Depression Screen  (Note: if answer to either of the following is "Yes", a more complete depression screening is indicated)  Over the past two weeks, have you felt down, depressed or hopeless? No Over the past two weeks, have you felt little interest or pleasure in doing things? No Have you lost interest or pleasure in daily life? No Do you often feel hopeless? No Do you cry easily over simple problems? No   Activities of Daily Living  In your present state of health, do you have any difficulty performing the following activities?:  Driving? No  Managing money? No  Feeding yourself? No  Getting from bed to chair? No  Climbing a flight of stairs? No  Preparing food and eating?: No  Bathing or showering? No  Getting dressed: No  Getting to the toilet? No  Using the toilet:No  Moving around from place to place: No  In the past year have you fallen or had a near fall?:No  Are you sexually active? No  Do you have more than one partner? No   Hearing Difficulties: yes Do you often ask people to speak up or repeat themselves? No  Do you experience ringing or noises in your ears? Yes Do you have difficulty understanding soft or whispered voices? No  Do you feel that you have a problem with memory? Yes Do you often misplace items? No  Do you feel safe at home?  Yes  Cognitive Testing  Alert? Yes Normal Appearance?Yes  Oriented to person? Yes Place? Yes  Time? Yes  Recall of three objects? Yes  Can perform simple calculations? Yes  Displays appropriate judgment?Yes  Can read the correct time from a watch face?Yes   List the Names of Other Physician/Practitioners you currently use: Eye doctor    Screening Tests / Date Colonoscopy           UTD          Zostavax  Declines  Mammogram UTD Pneumonia Vaccines- UTD Tetanus/tdap - UTD    ROS: GEN- denies fatigue, fever, weight loss,weakness, recent illness HEENT- denies eye drainage, change in vision, nasal discharge, CVS- denies chest pain, palpitations RESP- denies SOB, cough, wheeze ABD- denies N/V, change in stools, abd pain GU- denies dysuria, hematuria, dribbling, incontinence MSK- denies joint pain, muscle aches, injury Neuro- denies headache, dizziness, syncope, seizure activity  Physical: GEN- NAD, alert and oriented x3 HEENT- PERRL, EOMI, non injected sclera, pink conjunctiva, MMM, oropharynx clear Neck- Supple, no thryomegaly CVS- RRR, no murmur RESP-CTAB ABD-NABS,soft,NT,ND Neuro-CNII-XII intact, neg phalens, neg tinels, normal monofilament MSK- normal appearance hand, wrist, normal grasp, able to make fist  EXT- No edema Pulses- Radial, DP- 2+     Assessment:    Annual wellness medicare exam   Plan:    During the course of the visit the patient was educated and counseled about appropriate screening and preventive  services including:  Screening mammography Due in Nov  / continues on hormones for post menopausal symptoms  Shingles vaccine. Pt declines   Screen Neg for depression. PHQ- 9 score of 1    Hearing screen- Normal in office, ringing is intermittant  Just monitor fr now   HTN- controlled no changes, check fasting labs for the cholesterol  Carpal tunnel- mild symptoms left hand, has brace at home, advised to try, we will see how this  progresses   Diet review for nutrition referral? Yes ____ Not Indicated __x__  Patient Instructions (the written plan) was given to the patient.  Medicare Attestation  I have personally reviewed:  The patient's medical and social history  Their use of alcohol, tobacco or illicit drugs  Their current medications and supplements  The patient's functional ability including ADLs,fall risks, home safety risks, cognitive, and hearing and visual impairment  Diet and physical activities  Evidence for depression or mood disorders  The patient's weight, height, BMI, and visual acuity have been recorded in the chart. I have made referrals, counseling, and provided education to the patient based on review of the above and I have provided the patient with a written personalized care plan for preventive services.

## 2016-11-10 LAB — COMPREHENSIVE METABOLIC PANEL
ALBUMIN: 4.3 g/dL (ref 3.6–5.1)
ALK PHOS: 70 U/L (ref 33–130)
ALT: 16 U/L (ref 6–29)
AST: 20 U/L (ref 10–35)
BUN: 12 mg/dL (ref 7–25)
CO2: 25 mmol/L (ref 20–31)
Calcium: 9.2 mg/dL (ref 8.6–10.4)
Chloride: 105 mmol/L (ref 98–110)
Creat: 0.99 mg/dL (ref 0.50–0.99)
Glucose, Bld: 80 mg/dL (ref 70–99)
Potassium: 4.3 mmol/L (ref 3.5–5.3)
Sodium: 141 mmol/L (ref 135–146)
TOTAL PROTEIN: 7 g/dL (ref 6.1–8.1)
Total Bilirubin: 0.6 mg/dL (ref 0.2–1.2)

## 2016-11-10 LAB — CBC WITH DIFFERENTIAL/PLATELET
BASOS ABS: 0 {cells}/uL (ref 0–200)
BASOS PCT: 0 %
EOS ABS: 108 {cells}/uL (ref 15–500)
Eosinophils Relative: 2 %
HEMATOCRIT: 40.2 % (ref 35.0–45.0)
HEMOGLOBIN: 13 g/dL (ref 12.0–15.0)
LYMPHS ABS: 2322 {cells}/uL (ref 850–3900)
Lymphocytes Relative: 43 %
MCH: 27.3 pg (ref 27.0–33.0)
MCHC: 32.3 g/dL (ref 32.0–36.0)
MCV: 84.5 fL (ref 80.0–100.0)
MONO ABS: 378 {cells}/uL (ref 200–950)
MPV: 10.4 fL (ref 7.5–12.5)
Monocytes Relative: 7 %
NEUTROS ABS: 2592 {cells}/uL (ref 1500–7800)
Neutrophils Relative %: 48 %
Platelets: 270 10*3/uL (ref 140–400)
RBC: 4.76 MIL/uL (ref 3.80–5.10)
RDW: 14.2 % (ref 11.0–15.0)
WBC: 5.4 10*3/uL (ref 3.8–10.8)

## 2016-11-10 LAB — LIPID PANEL
CHOL/HDL RATIO: 2.5 ratio (ref <5.0)
CHOLESTEROL: 166 mg/dL (ref <200)
HDL: 67 mg/dL (ref >50)
LDL Cholesterol: 76 mg/dL (ref <100)
TRIGLYCERIDES: 115 mg/dL (ref <150)
VLDL: 23 mg/dL (ref <30)

## 2017-02-19 ENCOUNTER — Other Ambulatory Visit: Payer: Self-pay | Admitting: Family Medicine

## 2017-02-21 NOTE — Telephone Encounter (Signed)
Refill approprioate

## 2017-02-23 ENCOUNTER — Encounter: Payer: Self-pay | Admitting: Family Medicine

## 2017-02-23 ENCOUNTER — Ambulatory Visit (INDEPENDENT_AMBULATORY_CARE_PROVIDER_SITE_OTHER): Payer: BC Managed Care – PPO | Admitting: Family Medicine

## 2017-02-23 VITALS — BP 124/82 | HR 70 | Temp 98.4°F | Resp 16 | Ht <= 58 in | Wt 159.6 lb

## 2017-02-23 DIAGNOSIS — M5442 Lumbago with sciatica, left side: Secondary | ICD-10-CM

## 2017-02-23 DIAGNOSIS — R112 Nausea with vomiting, unspecified: Secondary | ICD-10-CM | POA: Diagnosis not present

## 2017-02-23 DIAGNOSIS — R319 Hematuria, unspecified: Secondary | ICD-10-CM

## 2017-02-23 DIAGNOSIS — N390 Urinary tract infection, site not specified: Secondary | ICD-10-CM

## 2017-02-23 LAB — URINALYSIS, ROUTINE W REFLEX MICROSCOPIC
Bilirubin Urine: NEGATIVE
Glucose, UA: NEGATIVE
KETONES UR: NEGATIVE
Nitrite: NEGATIVE
SPECIFIC GRAVITY, URINE: 1.025 (ref 1.001–1.03)
pH: 5.5 (ref 5.0–8.0)

## 2017-02-23 LAB — MICROSCOPIC MESSAGE

## 2017-02-23 MED ORDER — TRAMADOL HCL 50 MG PO TABS: 50.0000 mg | ORAL_TABLET | Freq: Three times a day (TID) | ORAL | 0 refills | Status: DC | PRN

## 2017-02-23 MED ORDER — CEPHALEXIN 500 MG PO CAPS: 500.0000 mg | ORAL_CAPSULE | Freq: Two times a day (BID) | ORAL | 0 refills | Status: DC

## 2017-02-23 MED ORDER — NAPROXEN 500 MG PO TABS: 500.0000 mg | ORAL_TABLET | Freq: Two times a day (BID) | ORAL | 0 refills | Status: DC

## 2017-02-23 MED ORDER — PROMETHAZINE HCL 12.5 MG PO TABS: 12.5000 mg | ORAL_TABLET | Freq: Three times a day (TID) | ORAL | 0 refills | Status: DC | PRN

## 2017-02-23 NOTE — Progress Notes (Signed)
   Subjective:    Patient ID: Mary Wilkinson, female    DOB: 1945-07-14, 71 y.o.   MRN: [id number]  Patient presents for Back Pain (going down left leg started 9-11)    Pt here with back pain across lower back for a few days, felt stiff, pain with sitting long periods of times, then yesterday had severe pain that radiated down her left leg. Only relief was laying down. She then took "pain pill" from a family member  X 2 doses about 1 hour apart, does not know the name of the pill. Afterwards she had N/V and diarrhea. Today feels much better. No further GI symptoms, no pain radiating down leg, but feels a twinge in her lower back. She does get some back pain and has had the leg pain but more soreness, not enough to stop her activites in the past She has known DDD.    No urinary incontinence, no dysuria symptoms    Review Of Systems:  GEN- denies fatigue, fever, weight loss,weakness, recent illness HEENT- denies eye drainage, change in vision, nasal discharge, CVS- denies chest pain, palpitations RESP- denies SOB, cough, wheeze ABD- denies N/V, change in stools, abd pain GU- denies dysuria, hematuria, dribbling, incontinence MSK-+ joint pain, muscle aches, injury Neuro- denies headache, dizziness, syncope, seizure activity       Objective:    BP 124/82   Pulse 70   Temp 98.4 F (36.9 C) (Oral)   Resp 16   Ht 4\' 8"  (1.422 m)   Wt 159 lb 9.6 oz (72.4 kg)   SpO2 97%   BMI 35.78 kg/m  GEN- NAD, alert and oriented x3 CVS- RRR, no murmur RESP-CTAB ABD-NABS,soft,ND, mild  Over suprapubic region, no rebound, no guarding, no CVA tenderness MSK- Mild TTP lumbar spine, no spasm, neg SLR, fair ROM spine, hips/knees , non antalgic gait  Neuro- strength 5/5 bilat, tone normal, sensation grossly in tact, DTR symmetric  EXT- No edema Pulses- Radial, DP- 2+        Assessment & Plan:      Problem List Items Addressed This Visit    None    Visit Diagnoses    Acute bilateral low  back pain with left-sided sciatica    -  Primary   DDD with sciatica symptoms, dounbt kidney stone though in differential would cauise pain down her leg. Given ultram, naprosyn, exam no red flags   Relevant Medications   traMADol (ULTRAM) 50 MG tablet   naproxen (NAPROSYN) 500 MG tablet   Other Relevant Orders   Urinalysis, Routine w reflex microscopic (Completed)   Nausea and vomiting, intractability of vomiting not specified, unspecified vomiting type       Relevant Orders   Urinalysis, Routine w reflex microscopic (Completed)   Urine Culture   Urinary tract infection with hematuria, site unspecified       Micropic hematuria also noted,  will treat for UTI, also discussed possible kidney stone, but difficult to correlate symptoms, not sure if pain pill made or sick or this was a colic like event though does not explain diarrhea if stone.   Given phenergan for nausea, will see how symptoms progress abd exam fairly benign   Relevant Medications   cephALEXin (KEFLEX) 500 MG capsule   Other Relevant Orders   Urine Culture      Note: This dictation was prepared with Dragon dictation along with smaller phrase technology. Any transcriptional errors that result from this process are unintentional.

## 2017-02-23 NOTE — Patient Instructions (Addendum)
Take the naprosyn for pain for sciatica Take antibiotics Use the ultram if severe pain Call if not improving and imaging will be done  F/U as previous

## 2017-02-25 ENCOUNTER — Telehealth: Payer: Self-pay

## 2017-02-25 LAB — URINE CULTURE
MICRO NUMBER:: 81006402
SPECIMEN QUALITY:: ADEQUATE

## 2017-02-25 NOTE — Telephone Encounter (Signed)
noted 

## 2017-02-25 NOTE — Telephone Encounter (Signed)
Spoke with patient regarding her symptoms. Patient states she has improved she still has a little pain in her left leg no severe back pain

## 2017-02-25 NOTE — Telephone Encounter (Signed)
-----   Message from Salley Scarlet, MD sent at 02/25/2017  9:20 AM EDT ----- Call pt see if she has improved with pain meds and antibiotics

## 2017-03-29 ENCOUNTER — Other Ambulatory Visit: Payer: Self-pay | Admitting: Family Medicine

## 2017-04-08 ENCOUNTER — Other Ambulatory Visit: Payer: Self-pay | Admitting: Family Medicine

## 2017-04-08 DIAGNOSIS — Z1231 Encounter for screening mammogram for malignant neoplasm of breast: Secondary | ICD-10-CM

## 2017-05-13 ENCOUNTER — Other Ambulatory Visit: Payer: Self-pay | Admitting: *Deleted

## 2017-05-13 MED ORDER — NAPROXEN 500 MG PO TABS: 500.0000 mg | ORAL_TABLET | Freq: Two times a day (BID) | ORAL | 0 refills | Status: DC

## 2017-05-16 ENCOUNTER — Ambulatory Visit (HOSPITAL_COMMUNITY)
Admission: RE | Admit: 2017-05-16 | Discharge: 2017-05-16 | Disposition: A | Discharge status: Home / Self Care | Payer: Medicare Other | Source: Ambulatory Visit | Attending: Family Medicine | Admitting: Family Medicine

## 2017-05-16 DIAGNOSIS — Z1231 Encounter for screening mammogram for malignant neoplasm of breast: Secondary | ICD-10-CM | POA: Diagnosis not present

## 2017-11-06 ENCOUNTER — Other Ambulatory Visit: Payer: Self-pay | Admitting: Family Medicine

## 2017-11-11 ENCOUNTER — Encounter: Payer: Self-pay | Admitting: Family Medicine

## 2017-11-11 ENCOUNTER — Ambulatory Visit (INDEPENDENT_AMBULATORY_CARE_PROVIDER_SITE_OTHER): Payer: Medicare Other | Admitting: Family Medicine

## 2017-11-11 ENCOUNTER — Other Ambulatory Visit: Payer: Self-pay

## 2017-11-11 VITALS — BP 150/90 | HR 63 | Temp 97.7°F | Resp 14 | Ht 60.0 in | Wt 164.4 lb

## 2017-11-11 DIAGNOSIS — E7800 Pure hypercholesterolemia, unspecified: Secondary | ICD-10-CM | POA: Diagnosis not present

## 2017-11-11 DIAGNOSIS — Z1159 Encounter for screening for other viral diseases: Secondary | ICD-10-CM | POA: Diagnosis not present

## 2017-11-11 DIAGNOSIS — I10 Essential (primary) hypertension: Secondary | ICD-10-CM

## 2017-11-11 DIAGNOSIS — Z0000 Encounter for general adult medical examination without abnormal findings: Secondary | ICD-10-CM

## 2017-11-11 DIAGNOSIS — G250 Essential tremor: Secondary | ICD-10-CM | POA: Diagnosis not present

## 2017-11-11 NOTE — Progress Notes (Signed)
Subjective:   Patient presents for Medicare Annual/Subsequent preventive examination.    Due for Hep C screening  She has notied her head bobbing, over the past few months. No tremor in hands or abnormal facial movements, but this concerns her as her brother and aunt have severe head bobbing, tremor.  No change in her mentation per husband If she is intentionally doing something she does not notice it as much Meds reviewed   HTN- no BP meds this AM, has cuff at home  Review Past Medical/Family/Social: Per EMR   Risk Factors  Current exercise habits: walks Dietary issues discussed: Yes  Cardiac risk factors: Obesity (BMI >= 30 kg/m2). HTN Hyperlipidemia  Depression Screen  (Note: if answer to either of the following is "Yes", a more complete depression screening is indicated)  Over the past two weeks, have you felt down, depressed or hopeless? No Over the past two weeks, have you felt little interest or pleasure in doing things? No Have you lost interest or pleasure in daily life? No Do you often feel hopeless? No Do you cry easily over simple problems? No   Activities of Daily Living  In your present state of health, do you have any difficulty performing the following activities?:  Driving? No  Managing money? No  Feeding yourself? No  Getting from bed to chair? No  Climbing a flight of stairs? No  Preparing food and eating?: No  Bathing or showering? No  Getting dressed: No  Getting to the toilet? No  Using the toilet:No  Moving around from place to place: No  In the past year have you fallen or had a near fall?:No  Are you sexually active? No  Do you have more than one partner? No   Hearing Difficulties: No  Do you often ask people to speak up or repeat themselves? No  Do you experience ringing or noises in your ears? No Do you have difficulty understanding soft or whispered voices? No  Do you feel that you have a problem with memory? No Do you often misplace items?  No  Do you feel safe at home? Yes  Cognitive Testing  Alert? Yes Normal Appearance?Yes  Oriented to person? Yes Place? Yes  Time? Yes  Recall of three objects? Yes  Can perform simple calculations? Yes  Displays appropriate judgment?Yes  Can read the correct time from a watch face?Yes   List the Names of Other Physician/Practitioners you currently use:   Screening Tests / Date Colonoscopy    UTD                 Zostavax Declines  Mammogram  UTD  Pneumonia- UTD  Tetanus/tdap UTD Bone Density- Normal 2015   ROS: GEN- denies fatigue, fever, weight loss,weakness, recent illness HEENT- denies eye drainage, change in vision, nasal discharge, CVS- denies chest pain, palpitations RESP- denies SOB, cough, wheeze ABD- denies N/V, change in stools, abd pain GU- denies dysuria, hematuria, dribbling, incontinence MSK- denies joint pain, muscle aches, injury Neuro- denies headache, dizziness, syncope, seizure activity  Physical: GEN- NAD, alert and oriented x3 HEENT- PERRL, EOMI, non injected sclera, pink conjunctiva, MMM, oropharynx clear Neck- Supple, no thryomegaly CVS- RRR, no murmur RESP-CTAB ABD-NABS, Soft, NT,ND Neuro- CNII-XII in tact, no focal deficits, no noticeable head bob or tremor in extremeties  EXT- No edema Pulses- Radial, DP- 2+    Assessment:    Annual wellness medicare exam   Plan:    During the course of the visit the  patient was educated and counseled about appropriate screening and preventive services including:  Prevention UTD, declines shingles  .  Screen Neg for depression.  HTN- BP typically well controlled, no meds today, have her check BP at home call if consistently > 140/90  Head bob- probable essential tremor in family, no known parkinson, but brother has not been to a physician, concerned about these new changes, send to neurology for evaluation. Her last MRI of brain was in 2016   Fasting labs today    Given handout on advanced  directives living will    Diet review for nutrition referral? Yes ____ Not Indicated __x__  Patient Instructions (the written plan) was given to the patient.  Medicare Attestation  I have personally reviewed:  The patient's medical and social history  Their use of alcohol, tobacco or illicit drugs  Their current medications and supplements  The patient's functional ability including ADLs,fall risks, home safety risks, cognitive, and hearing and visual impairment  Diet and physical activities  Evidence for depression or mood disorders  The patient's weight, height, BMI, and visual acuity have been recorded in the chart. I have made referrals, counseling, and provided education to the patient based on review of the above and I have provided the patient with a written personalized care plan for preventive services.

## 2017-11-11 NOTE — Patient Instructions (Addendum)
F/U 6 months  Referral to neurology  Check blood pressure at home call if  > 140/90

## 2017-11-12 LAB — CBC WITH DIFFERENTIAL/PLATELET
BASOS ABS: 18 {cells}/uL (ref 0–200)
Basophils Relative: 0.4 %
EOS PCT: 1.6 %
Eosinophils Absolute: 72 cells/uL (ref 15–500)
HEMATOCRIT: 37.5 % (ref 35.0–45.0)
Hemoglobin: 12.7 g/dL (ref 11.7–15.5)
Lymphs Abs: 2111 cells/uL (ref 850–3900)
MCH: 27.9 pg (ref 27.0–33.0)
MCHC: 33.9 g/dL (ref 32.0–36.0)
MCV: 82.4 fL (ref 80.0–100.0)
MONOS PCT: 8.5 %
MPV: 10.6 fL (ref 7.5–12.5)
NEUTROS PCT: 42.6 %
Neutro Abs: 1917 cells/uL (ref 1500–7800)
Platelets: 246 10*3/uL (ref 140–400)
RBC: 4.55 10*6/uL (ref 3.80–5.10)
RDW: 13 % (ref 11.0–15.0)
Total Lymphocyte: 46.9 %
WBC mixed population: 383 cells/uL (ref 200–950)
WBC: 4.5 10*3/uL (ref 3.8–10.8)

## 2017-11-12 LAB — COMPREHENSIVE METABOLIC PANEL
AG Ratio: 1.7 (calc) (ref 1.0–2.5)
ALT: 22 U/L (ref 6–29)
AST: 24 U/L (ref 10–35)
Albumin: 4.3 g/dL (ref 3.6–5.1)
Alkaline phosphatase (APISO): 62 U/L (ref 33–130)
BUN: 7 mg/dL (ref 7–25)
CALCIUM: 9.4 mg/dL (ref 8.6–10.4)
CO2: 24 mmol/L (ref 20–32)
Chloride: 108 mmol/L (ref 98–110)
Creat: 0.89 mg/dL (ref 0.60–0.93)
GLUCOSE: 87 mg/dL (ref 65–99)
Globulin: 2.6 g/dL (calc) (ref 1.9–3.7)
Potassium: 4.3 mmol/L (ref 3.5–5.3)
SODIUM: 143 mmol/L (ref 135–146)
TOTAL PROTEIN: 6.9 g/dL (ref 6.1–8.1)
Total Bilirubin: 0.7 mg/dL (ref 0.2–1.2)

## 2017-11-12 LAB — LIPID PANEL
Cholesterol: 165 mg/dL (ref <200)
HDL: 66 mg/dL (ref >50)
LDL CHOLESTEROL (CALC): 83 mg/dL
Non-HDL Cholesterol (Calc): 99 mg/dL (calc) (ref <130)
TRIGLYCERIDES: 75 mg/dL (ref <150)
Total CHOL/HDL Ratio: 2.5 (calc) (ref <5.0)

## 2017-11-12 LAB — HEPATITIS C ANTIBODY
Hepatitis C Ab: NONREACTIVE
SIGNAL TO CUT-OFF: 0.02 (ref <1.00)

## 2017-11-13 ENCOUNTER — Encounter: Payer: Self-pay | Admitting: Family Medicine

## 2017-11-15 ENCOUNTER — Telehealth: Payer: Self-pay | Admitting: *Deleted

## 2017-11-15 NOTE — Telephone Encounter (Signed)
Received call from patient.   Reports that BP readings are as follows: 6/2 121/73 2pm  152/88 6pm  158/85 9pm  146/ 89 (3 min later) 6/3 135/77 2:30pm  144/82 9pm  131/76 10pm 6/4 128/71 7am

## 2017-11-15 NOTE — Telephone Encounter (Signed)
Keep monitoring BP looks okay, it can fluctate some , only needs to check once a day, not multiple times a day  No new meds

## 2017-11-16 NOTE — Telephone Encounter (Signed)
Call placed to patient and patient made aware.  

## 2017-12-20 DIAGNOSIS — G5603 Carpal tunnel syndrome, bilateral upper limbs: Secondary | ICD-10-CM | POA: Diagnosis not present

## 2017-12-20 DIAGNOSIS — M130 Polyarthritis, unspecified: Secondary | ICD-10-CM | POA: Diagnosis not present

## 2017-12-20 DIAGNOSIS — I10 Essential (primary) hypertension: Secondary | ICD-10-CM | POA: Diagnosis not present

## 2017-12-20 DIAGNOSIS — G250 Essential tremor: Secondary | ICD-10-CM | POA: Diagnosis not present

## 2017-12-21 ENCOUNTER — Other Ambulatory Visit: Payer: Self-pay | Admitting: Family Medicine

## 2018-02-06 ENCOUNTER — Other Ambulatory Visit: Payer: Self-pay | Admitting: *Deleted

## 2018-02-06 MED ORDER — LISINOPRIL 40 MG PO TABS: 40.0000 mg | ORAL_TABLET | Freq: Every day | ORAL | 2 refills | Status: DC

## 2018-02-06 MED ORDER — ESTRADIOL 0.5 MG PO TABS: 0.5000 mg | ORAL_TABLET | Freq: Every day | ORAL | 2 refills | Status: DC

## 2018-02-21 ENCOUNTER — Other Ambulatory Visit: Payer: Self-pay | Admitting: *Deleted

## 2018-02-21 MED ORDER — ESTRADIOL 0.5 MG PO TABS: 0.5000 mg | ORAL_TABLET | Freq: Every day | ORAL | 2 refills | Status: DC

## 2018-02-21 MED ORDER — LISINOPRIL 40 MG PO TABS: 40.0000 mg | ORAL_TABLET | Freq: Every day | ORAL | 2 refills | Status: DC

## 2018-04-17 ENCOUNTER — Other Ambulatory Visit: Payer: Self-pay | Admitting: Family Medicine

## 2018-04-17 DIAGNOSIS — Z1231 Encounter for screening mammogram for malignant neoplasm of breast: Secondary | ICD-10-CM

## 2018-05-22 ENCOUNTER — Encounter (HOSPITAL_COMMUNITY): Payer: Self-pay

## 2018-05-22 ENCOUNTER — Ambulatory Visit (HOSPITAL_COMMUNITY)
Admission: RE | Admit: 2018-05-22 | Discharge: 2018-05-22 | Disposition: A | Discharge status: Home / Self Care | Payer: Medicare Other | Source: Ambulatory Visit | Attending: Family Medicine | Admitting: Family Medicine

## 2018-05-22 DIAGNOSIS — Z1231 Encounter for screening mammogram for malignant neoplasm of breast: Secondary | ICD-10-CM | POA: Diagnosis not present

## 2018-05-30 ENCOUNTER — Other Ambulatory Visit: Payer: Self-pay | Admitting: *Deleted

## 2018-05-30 MED ORDER — TRAMADOL HCL 50 MG PO TABS: 50.0000 mg | ORAL_TABLET | Freq: Three times a day (TID) | ORAL | 0 refills | Status: DC | PRN

## 2018-05-30 NOTE — Telephone Encounter (Signed)
Received call from patient.   States that she has increased shoulder pain and is requesting refill on Tramadol.   Ok to refill??  Last office visit 11/11/2017.  Last refill 02/23/2017.

## 2018-05-31 MED ORDER — TRAMADOL HCL 50 MG PO TABS: 50.0000 mg | ORAL_TABLET | Freq: Three times a day (TID) | ORAL | 0 refills | Status: DC | PRN

## 2018-05-31 NOTE — Addendum Note (Signed)
Addended by: Phillips OdorSIX, Kanyon Bunn H on: 05/31/2018 11:07 AM   Modules accepted: Orders

## 2018-05-31 NOTE — Telephone Encounter (Signed)
Please re-submit for electronic refill.

## 2018-06-01 ENCOUNTER — Telehealth: Payer: Self-pay | Admitting: *Deleted

## 2018-06-01 NOTE — Telephone Encounter (Signed)
Your information has been submitted to [employer]. To check for an updated outcome later, reopen this PA request from your dashboard. If [employer] has not responded to your request within 24 hours, contact [employer] at [phone]. If you think there may be a problem with your PA request, use our live chat feature at the bottom right.    

## 2018-06-01 NOTE — Telephone Encounter (Signed)
Received request from pharmacy for PA on Tramadol.   PA submitted.   Dx:G89.4- chronic pain, M17- OA knee, M50.3- cervicalgia

## 2018-06-02 NOTE — Telephone Encounter (Signed)
Received PA determination.   PA approved 06/01/2018- 06/02/2019.  Pharmacy made aware.

## 2018-07-24 ENCOUNTER — Other Ambulatory Visit: Payer: Self-pay

## 2018-07-24 ENCOUNTER — Encounter: Payer: Self-pay | Admitting: Family Medicine

## 2018-07-24 ENCOUNTER — Ambulatory Visit (INDEPENDENT_AMBULATORY_CARE_PROVIDER_SITE_OTHER): Payer: Medicare Other | Admitting: Family Medicine

## 2018-07-24 VITALS — BP 140/88 | HR 80 | Temp 98.1°F | Resp 14 | Ht 60.0 in | Wt 156.0 lb

## 2018-07-24 DIAGNOSIS — R1030 Lower abdominal pain, unspecified: Secondary | ICD-10-CM

## 2018-07-24 DIAGNOSIS — K5792 Diverticulitis of intestine, part unspecified, without perforation or abscess without bleeding: Secondary | ICD-10-CM | POA: Diagnosis not present

## 2018-07-24 DIAGNOSIS — M19011 Primary osteoarthritis, right shoulder: Secondary | ICD-10-CM | POA: Diagnosis not present

## 2018-07-24 LAB — URINALYSIS, ROUTINE W REFLEX MICROSCOPIC
Bilirubin Urine: NEGATIVE
Glucose, UA: NEGATIVE
Hgb urine dipstick: NEGATIVE
Ketones, ur: NEGATIVE
LEUKOCYTES UA: NEGATIVE
Nitrite: NEGATIVE
Protein, ur: NEGATIVE
Specific Gravity, Urine: 1.01 (ref 1.001–1.03)
pH: 7 (ref 5.0–8.0)

## 2018-07-24 MED ORDER — CIPROFLOXACIN HCL 500 MG PO TABS: 500.0000 mg | ORAL_TABLET | Freq: Two times a day (BID) | ORAL | 0 refills | Status: DC

## 2018-07-24 MED ORDER — METRONIDAZOLE 500 MG PO TABS: 500.0000 mg | ORAL_TABLET | Freq: Two times a day (BID) | ORAL | 0 refills | Status: DC

## 2018-07-24 NOTE — Patient Instructions (Addendum)
Take antibiotics as prescribed Use pepcid/zantac for any heart burn symptoms F/U pending results

## 2018-07-24 NOTE — Progress Notes (Signed)
   Subjective:    Patient ID: Mary Wilkinson, female    DOB: August 20, 1945, 73 y.o.   MRN: [id number]  Patient presents for LLQ Pain (x1 week- pain n lower bad, burnng sensationn in stomach, no changs to BM, increased gas) and R Shoulder Pain (x6 months- decreased ROM- achy pain)   Abdominal pain mostly in LLQ, also feels nauseated, burning sensation on lower problems. No change in bowels. No fever, no vomiting. Took pepto bismol   denies dysuria   Right shoulder pain- intermittant pain, into shoulder,,no swelling ,taking ibuprofen, also takes bayer aspirin, as needed, has been present for 6 months or more.  - took ultram once  Still able to use shoulder normally.  She more just wanted me to know about it does not want any intervention at this time  Colonoscopy 2012- normal except internal hemorroids  she does have history of diverticuitis flares   Review Of Systems:  GEN- denies fatigue, fever, weight loss,weakness, recent illness HEENT- denies eye drainage, change in vision, nasal discharge, CVS- denies chest pain, palpitations RESP- denies SOB, cough, wheeze ABD- denies N/V, change in stools, +abd pain GU- denies dysuria, hematuria, dribbling, incontinence MSK- + joint pain, muscle aches, injury Neuro- denies headache, dizziness, syncope, seizure activity       Objective:    BP 140/88   Pulse 80   Temp 98.1 F (36.7 C) (Oral)   Resp 14   Ht 5' (1.524 m)   Wt 156 lb (70.8 kg)   SpO2 99%   BMI 30.47 kg/m  GEN- NAD, alert and oriented x3,weight down 8lbs since May HEENT- PERRL, EOMI, non injected sclera, pink conjunctiva, MMM, oropharynx clear Neck- Supple, fair ROM CVS- RRR, no murmur RESP-CTAB ABD-NABS,soft,N,ND. TTP,LLQ, and lower mid abdomen, no mass palpated , No CVA tenderness MSK- Good ROM bilat UE, TTP AC joint right  EXT- No edema Pulses- Radial,  2+        Assessment & Plan:   Weight down, has had some decreased appetite with symptoms    Problem List  Items Addressed This Visit    None    Visit Diagnoses    Abdominal pain, lower    -  Primary   Relevant Orders   Urinalysis, Routine w reflex microscopic (Completed)   CBC with Differential/Platelet   Comprehensive metabolic panel   Diverticulitis       Treat for possible diverticulitis, UA neg, history of flares in past, no fever, no red flags, cover with cipro/flagyl, check labs, will also use H2 blocker for any reflux like symptoms   Primary osteoarthritis of right shoulder       For now she is doing home exercises, prn NSAID OR TYLENOL, no constant pain, not interferring with activites, if this worsens send to ortho      Note: This dictation was prepared with Dragon dictation along with smaller phrase technology. Any transcriptional errors that result from this process are unintentional.

## 2018-07-25 LAB — COMPREHENSIVE METABOLIC PANEL
AG Ratio: 1.7 (calc) (ref 1.0–2.5)
ALT: 19 U/L (ref 6–29)
AST: 21 U/L (ref 10–35)
Albumin: 4.6 g/dL (ref 3.6–5.1)
Alkaline phosphatase (APISO): 70 U/L (ref 37–153)
BUN: 7 mg/dL (ref 7–25)
CO2: 28 mmol/L (ref 20–32)
Calcium: 9.7 mg/dL (ref 8.6–10.4)
Chloride: 106 mmol/L (ref 98–110)
Creat: 0.83 mg/dL (ref 0.60–0.93)
Globulin: 2.7 g/dL (calc) (ref 1.9–3.7)
Glucose, Bld: 86 mg/dL (ref 65–99)
Potassium: 4.1 mmol/L (ref 3.5–5.3)
Sodium: 141 mmol/L (ref 135–146)
Total Bilirubin: 0.7 mg/dL (ref 0.2–1.2)
Total Protein: 7.3 g/dL (ref 6.1–8.1)

## 2018-07-25 LAB — CBC WITH DIFFERENTIAL/PLATELET
Absolute Monocytes: 435 cells/uL (ref 200–950)
Basophils Absolute: 22 cells/uL (ref 0–200)
Basophils Relative: 0.4 %
EOS ABS: 88 {cells}/uL (ref 15–500)
Eosinophils Relative: 1.6 %
HCT: 39.4 % (ref 35.0–45.0)
HEMOGLOBIN: 13.1 g/dL (ref 11.7–15.5)
Lymphs Abs: 2217 cells/uL (ref 850–3900)
MCH: 27.6 pg (ref 27.0–33.0)
MCHC: 33.2 g/dL (ref 32.0–36.0)
MCV: 83.1 fL (ref 80.0–100.0)
MONOS PCT: 7.9 %
MPV: 10.6 fL (ref 7.5–12.5)
Neutro Abs: 2739 cells/uL (ref 1500–7800)
Neutrophils Relative %: 49.8 %
Platelets: 337 10*3/uL (ref 140–400)
RBC: 4.74 10*6/uL (ref 3.80–5.10)
RDW: 13 % (ref 11.0–15.0)
Total Lymphocyte: 40.3 %
WBC: 5.5 10*3/uL (ref 3.8–10.8)

## 2018-07-27 ENCOUNTER — Telehealth: Payer: Self-pay | Admitting: Family Medicine

## 2018-07-27 NOTE — Telephone Encounter (Signed)
Pt notified of lab results. Do not have to call her she will let us know if her abdominal pain doesn't get any better.

## 2018-08-04 ENCOUNTER — Telehealth: Payer: Self-pay | Admitting: *Deleted

## 2018-08-04 NOTE — Telephone Encounter (Signed)
Received call from patient.   States that she has semi productive cough with clear mucus. Denies fever, chills, body aches, sore throat.    Advised to use OTC coricidin or delsym for cough. Advised to increase rest and to increase fluid intake. Recommended that if symptoms worsen or persist >1 week to contact office for visit.

## 2018-09-07 ENCOUNTER — Other Ambulatory Visit: Payer: Self-pay | Admitting: Family Medicine

## 2018-10-27 ENCOUNTER — Other Ambulatory Visit: Payer: Self-pay | Admitting: Family Medicine

## 2018-11-14 ENCOUNTER — Ambulatory Visit (INDEPENDENT_AMBULATORY_CARE_PROVIDER_SITE_OTHER): Payer: Medicare Other | Admitting: Family Medicine

## 2018-11-14 ENCOUNTER — Other Ambulatory Visit: Payer: Self-pay

## 2018-11-14 ENCOUNTER — Encounter: Payer: Self-pay | Admitting: Family Medicine

## 2018-11-14 VITALS — BP 120/70 | HR 56 | Temp 98.3°F | Resp 18 | Ht 59.0 in | Wt 150.2 lb

## 2018-11-14 DIAGNOSIS — Z0000 Encounter for general adult medical examination without abnormal findings: Secondary | ICD-10-CM

## 2018-11-14 DIAGNOSIS — I10 Essential (primary) hypertension: Secondary | ICD-10-CM

## 2018-11-14 DIAGNOSIS — Z0001 Encounter for general adult medical examination with abnormal findings: Secondary | ICD-10-CM

## 2018-11-14 DIAGNOSIS — N951 Menopausal and female climacteric states: Secondary | ICD-10-CM

## 2018-11-14 DIAGNOSIS — E7800 Pure hypercholesterolemia, unspecified: Secondary | ICD-10-CM

## 2018-11-14 DIAGNOSIS — G250 Essential tremor: Secondary | ICD-10-CM

## 2018-11-14 NOTE — Patient Instructions (Signed)
F/U 6 months

## 2018-11-14 NOTE — Progress Notes (Signed)
Subjective:   Patient presents for Medicare Annual/Subsequent preventive examination.   Every now and then feels a tug in left side but overall bowel are good, no pain. Urinating normally     Neurologist - does not want to return to her current one did not like care received  For the  Head bobbing, has brother with head tremor and bobbing as well. But for now wants hold off due to COVID-19   Hyperlipidemia- taking lipitor     post menopausal hot flashes - taking daily still but noticed not as many sweats     OA- unable to toleratae ultram , taking tylenol     HTN- taking lisinopril without any difficulty , has not checked recently    She losts 6lbs  Feb, has done 2 daniel fast with church, also fasting twice a week for spiritual means    Review Past Medical/Family/Social:per EMR   Risk Factors  Current exercise habits: walks  Dietary issues discussed: No concerns  Cardiac risk factors: HTN   Depression Screen  (Note: if answer to either of the following is "Yes", a more complete depression screening is indicated)  Over the past two weeks, have you felt down, depressed or hopeless? No Over the past two weeks, have you felt little interest or pleasure in doing things? No Have you lost interest or pleasure in daily life? No Do you often feel hopeless? No Do you cry easily over simple problems? No   Activities of Daily Living  In your present state of health, do you have any difficulty performing the following activities?:  Driving? No  Managing money? No  Feeding yourself? No  Getting from bed to chair? No  Climbing a flight of stairs? No  Preparing food and eating?: No  Bathing or showering? No  Getting dressed: No  Getting to the toilet? No  Using the toilet:No  Moving around from place to place: No  In the past year have you fallen or had a near fall?:No  Are you sexually active? No  Do you have more than one partner? No   Hearing Difficulties: No  Do you often  ask people to speak up or repeat themselves? No  Do you experience ringing or noises in your ears? No Do you have difficulty understanding soft or whispered voices? No  Do you feel that you have a problem with memory? No Do you often misplace items? No  Do you feel safe at home? Yes  Cognitive Testing  Alert? Yes Normal Appearance?Yes  Oriented to person? Yes Place? Yes  Time? Yes  Recall of three objects? Yes  Can perform simple calculations? Yes  Displays appropriate judgment?Yes  Can read the correct time from a watch face?Yes   List the Names of Other Physician/Practitioners you currently use:  Neurology  Screening Tests / Date Colonoscopy    UTD                 Zostavax Declines  Mammogram  UTD  Pneumonia- UTD  Tetanus/tdap UTD Bone Density- Normal 2015   ROS: GEN- denies fatigue, fever, weight loss,weakness, recent illness HEENT- denies eye drainage, change in vision, nasal discharge, CVS- denies chest pain, palpitations RESP- denies SOB, cough, wheeze ABD- denies N/V, change in stools, abd pain GU- denies dysuria, hematuria, dribbling, incontinence MSK- denies joint pain, muscle aches, injury Neuro- denies headache, dizziness, syncope, seizure activity  Physical: GEN- NAD, alert and oriented x3 HEENT- PERRL, EOMI, non injected sclera, pink conjunctiva, MMM, oropharynx  clear Neck- Supple, no thryomegaly CVS- RRR, no murmur RESP-CTAB ABD-NABS, Soft, NT,ND Neuro- CNII-XII in tact, no focal deficits, minimal  noticeable head bob , no tremor in extremeties  EXT- No edema Pulses- Radial, DP- 2+    Assessment:    Annual wellness medicare exam   Plan:    During the course of the visit the patient was educated and counseled about appropriate screening and preventive services including:    HTN- bp controlled, she has lost weight intentionally   check fasting labs  Head bob- defer second opinion until next visit   Immunizations UTD   Post menopausal-  recommend she try to take pill every other day , too small to cut in half          Diet review for nutrition referral? Yes ____ Not Indicated __x__  Patient Instructions (the written plan) was given to the patient.  Medicare Attestation  I have personally reviewed:  The patient's medical and social history  Their use of alcohol, tobacco or illicit drugs  Their current medications and supplements  The patient's functional ability including ADLs,fall risks, home safety risks, cognitive, and hearing and visual impairment  Diet and physical activities  Evidence for depression or mood disorders  The patient's weight, height, BMI, and visual acuity have been recorded in the chart. I have made referrals, counseling, and provided education to the patient based on review of the above and I have provided the patient with a written personalized care plan for preventive services.

## 2018-11-15 LAB — CBC WITH DIFFERENTIAL/PLATELET
Absolute Monocytes: 374 cells/uL (ref 200–950)
Basophils Absolute: 9 cells/uL (ref 0–200)
Basophils Relative: 0.2 %
Eosinophils Absolute: 50 cells/uL (ref 15–500)
Eosinophils Relative: 1.1 %
HCT: 39 % (ref 35.0–45.0)
Hemoglobin: 12.7 g/dL (ref 11.7–15.5)
Lymphs Abs: 1877 cells/uL (ref 850–3900)
MCH: 27.9 pg (ref 27.0–33.0)
MCHC: 32.6 g/dL (ref 32.0–36.0)
MCV: 85.5 fL (ref 80.0–100.0)
MPV: 10.6 fL (ref 7.5–12.5)
Monocytes Relative: 8.3 %
Neutro Abs: 2192 cells/uL (ref 1500–7800)
Neutrophils Relative %: 48.7 %
Platelets: 276 10*3/uL (ref 140–400)
RBC: 4.56 10*6/uL (ref 3.80–5.10)
RDW: 13.2 % (ref 11.0–15.0)
Total Lymphocyte: 41.7 %
WBC: 4.5 10*3/uL (ref 3.8–10.8)

## 2018-11-15 LAB — LIPID PANEL
Cholesterol: 195 mg/dL (ref <200)
HDL: 67 mg/dL (ref >=50)
LDL Cholesterol (Calc): 112 mg/dL (calc) — ABNORMAL HIGH
Non-HDL Cholesterol (Calc): 128 mg/dL (calc) (ref <130)
Total CHOL/HDL Ratio: 2.9 (calc) (ref <5.0)
Triglycerides: 72 mg/dL (ref <150)

## 2018-11-15 LAB — COMPREHENSIVE METABOLIC PANEL
AG Ratio: 1.7 (calc) (ref 1.0–2.5)
ALT: 26 U/L (ref 6–29)
AST: 27 U/L (ref 10–35)
Albumin: 4.4 g/dL (ref 3.6–5.1)
Alkaline phosphatase (APISO): 62 U/L (ref 37–153)
BUN: 12 mg/dL (ref 7–25)
CO2: 24 mmol/L (ref 20–32)
Calcium: 9.3 mg/dL (ref 8.6–10.4)
Chloride: 107 mmol/L (ref 98–110)
Creat: 0.88 mg/dL (ref 0.60–0.93)
Globulin: 2.6 g/dL (calc) (ref 1.9–3.7)
Glucose, Bld: 89 mg/dL (ref 65–99)
Potassium: 4.3 mmol/L (ref 3.5–5.3)
Sodium: 140 mmol/L (ref 135–146)
Total Bilirubin: 0.7 mg/dL (ref 0.2–1.2)
Total Protein: 7 g/dL (ref 6.1–8.1)

## 2019-04-19 ENCOUNTER — Other Ambulatory Visit (HOSPITAL_COMMUNITY): Payer: Self-pay | Admitting: Family Medicine

## 2019-04-19 DIAGNOSIS — Z1231 Encounter for screening mammogram for malignant neoplasm of breast: Secondary | ICD-10-CM

## 2019-05-15 ENCOUNTER — Other Ambulatory Visit: Payer: Self-pay

## 2019-05-16 ENCOUNTER — Encounter: Payer: Self-pay | Admitting: Family Medicine

## 2019-05-16 ENCOUNTER — Ambulatory Visit (INDEPENDENT_AMBULATORY_CARE_PROVIDER_SITE_OTHER): Payer: Medicare Other | Admitting: Family Medicine

## 2019-05-16 VITALS — BP 134/68 | HR 60 | Temp 98.7°F | Resp 14 | Ht 59.0 in | Wt 146.0 lb

## 2019-05-16 DIAGNOSIS — E7800 Pure hypercholesterolemia, unspecified: Secondary | ICD-10-CM

## 2019-05-16 DIAGNOSIS — G250 Essential tremor: Secondary | ICD-10-CM | POA: Diagnosis not present

## 2019-05-16 DIAGNOSIS — N951 Menopausal and female climacteric states: Secondary | ICD-10-CM

## 2019-05-16 DIAGNOSIS — I10 Essential (primary) hypertension: Secondary | ICD-10-CM

## 2019-05-16 NOTE — Assessment & Plan Note (Signed)
Continue Lipitor recheck LDL and liver function test.

## 2019-05-16 NOTE — Assessment & Plan Note (Signed)
Blood pressure well controlled we will check her fasting labs.  Her cholesterol had gone up a little at the last visit. Change in medications.

## 2019-05-16 NOTE — Patient Instructions (Addendum)
Referral to neurology  F/U 6 months for Physical  

## 2019-05-16 NOTE — Assessment & Plan Note (Signed)
Proceed with neurology referral.  Expected this is a familiar tremor based on her brother's history but he was never actually diagnosed with anything particular he did not seek care.

## 2019-05-16 NOTE — Assessment & Plan Note (Signed)
She does continue on the Estrace.  Feels like this controls her hot flashes the best.  Her mammogram is negative.

## 2019-05-16 NOTE — Progress Notes (Signed)
   Subjective:    Patient ID: Mary Wilkinson, female    DOB: [birth year], 73 y.o.   MRN: [id number]  Patient presents for Follow-up (is fasting)  Patient here to follow-up chronic medical problems.  Medications reviewed.  Her husband is currently undergoing bladder cancer treatment   Hypertension- taking Bp meds as prescribed, no SE with the medications  The head bobbing is worsening. When she is at rest or just sitting, will feel the body trembling  Brother has severe tremor and head bob and she believes Father had some tremor as well  She would like to proceed with neurology referral like we discussed at our previous visit  Weight dwn 4lbs, in setting of some stress per above with her husband,  But now has a good appetite, bowels normal, feels well otherwise       Her brother had same  Review Of Systems:  GEN- denies fatigue, fever, weight loss,weakness, recent illness HEENT- denies eye drainage, change in vision, nasal discharge, CVS- denies chest pain, palpitations RESP- denies SOB, cough, wheeze ABD- denies N/V, change in stools, abd pain GU- denies dysuria, hematuria, dribbling, incontinence MSK- denies joint pain, muscle aches, injury Neuro- denies headache, dizziness, syncope, seizure activity       Objective:    BP 134/68   Pulse 60   Temp 98.7 F (37.1 C) (Temporal)   Resp 14   Ht 4\' 11"  (1.499 m)   Wt 146 lb (66.2 kg)   SpO2 96%   BMI 29.49 kg/m  GEN- NAD, alert and oriented x3 HEENT- PERRL, EOMI, non injected sclera, pink conjunctiva, MMM, oropharynx clear Neck- Supple, no thyromegaly CVS- RRR, no murmur RESP-CTAB ABD-NABS,soft,NT,ND Neuro- CNII-XII in tact, no focal deficits, minimal  noticeable head bob , no tremor noted in extremities  EXT- No edema Pulses- Radial, DP- 2+         Assessment & Plan:      Problem List Items Addressed This Visit      Unprioritized   Benign head tremor    Proceed with neurology referral.  Expected this is a  familiar tremor based on her brother's history but he was never actually diagnosed with anything particular he did not seek care.      HTN (hypertension) - Primary    Blood pressure well controlled we will check her fasting labs.  Her cholesterol had gone up a little at the last visit. Change in medications.      Relevant Orders   CBC with Differential   Comprehensive metabolic panel   Lipid Panel   Hyperlipidemia    Continue Lipitor recheck LDL and liver function test.      Post menopausal syndrome    She does continue on the Estrace.  Feels like this controls her hot flashes the best.  Her mammogram is negative.       Other Visit Diagnoses    Tremor, hereditary, benign          Note: This dictation was prepared with Dragon dictation along with smaller phrase technology. Any transcriptional errors that result from this process are unintentional.

## 2019-05-17 LAB — CBC WITH DIFFERENTIAL/PLATELET
Absolute Monocytes: 361 cells/uL (ref 200–950)
Basophils Absolute: 8 cells/uL (ref 0–200)
Basophils Relative: 0.2 %
Eosinophils Absolute: 70 cells/uL (ref 15–500)
Eosinophils Relative: 1.7 %
HCT: 38.6 % (ref 35.0–45.0)
Hemoglobin: 12.5 g/dL (ref 11.7–15.5)
Lymphs Abs: 2025 cells/uL (ref 850–3900)
MCH: 27.5 pg (ref 27.0–33.0)
MCHC: 32.4 g/dL (ref 32.0–36.0)
MCV: 84.8 fL (ref 80.0–100.0)
MPV: 10.6 fL (ref 7.5–12.5)
Monocytes Relative: 8.8 %
Neutro Abs: 1636 cells/uL (ref 1500–7800)
Neutrophils Relative %: 39.9 %
Platelets: 248 10*3/uL (ref 140–400)
RBC: 4.55 10*6/uL (ref 3.80–5.10)
RDW: 13.1 % (ref 11.0–15.0)
Total Lymphocyte: 49.4 %
WBC: 4.1 10*3/uL (ref 3.8–10.8)

## 2019-05-17 LAB — COMPREHENSIVE METABOLIC PANEL
AG Ratio: 1.9 (calc) (ref 1.0–2.5)
ALT: 18 U/L (ref 6–29)
AST: 20 U/L (ref 10–35)
Albumin: 4.4 g/dL (ref 3.6–5.1)
Alkaline phosphatase (APISO): 54 U/L (ref 37–153)
BUN: 9 mg/dL (ref 7–25)
CO2: 25 mmol/L (ref 20–32)
Calcium: 9 mg/dL (ref 8.6–10.4)
Chloride: 106 mmol/L (ref 98–110)
Creat: 0.8 mg/dL (ref 0.60–0.93)
Globulin: 2.3 g/dL (calc) (ref 1.9–3.7)
Glucose, Bld: 97 mg/dL (ref 65–99)
Potassium: 3.6 mmol/L (ref 3.5–5.3)
Sodium: 142 mmol/L (ref 135–146)
Total Bilirubin: 0.7 mg/dL (ref 0.2–1.2)
Total Protein: 6.7 g/dL (ref 6.1–8.1)

## 2019-05-17 LAB — LIPID PANEL
Cholesterol: 176 mg/dL (ref <200)
HDL: 64 mg/dL (ref >=50)
LDL Cholesterol (Calc): 98 mg/dL (calc)
Non-HDL Cholesterol (Calc): 112 mg/dL (calc) (ref <130)
Total CHOL/HDL Ratio: 2.8 (calc) (ref <5.0)
Triglycerides: 54 mg/dL (ref <150)

## 2019-05-18 ENCOUNTER — Encounter: Payer: Self-pay | Admitting: *Deleted

## 2019-05-25 ENCOUNTER — Other Ambulatory Visit: Payer: Self-pay

## 2019-05-25 ENCOUNTER — Ambulatory Visit (HOSPITAL_COMMUNITY)
Admission: RE | Admit: 2019-05-25 | Discharge: 2019-05-25 | Disposition: A | Discharge status: Home / Self Care | Payer: Medicare Other | Source: Ambulatory Visit | Attending: Family Medicine | Admitting: Family Medicine

## 2019-05-25 DIAGNOSIS — Z1231 Encounter for screening mammogram for malignant neoplasm of breast: Secondary | ICD-10-CM | POA: Insufficient documentation

## 2019-05-28 ENCOUNTER — Ambulatory Visit (INDEPENDENT_AMBULATORY_CARE_PROVIDER_SITE_OTHER): Payer: Medicare Other | Admitting: Neurology

## 2019-05-28 ENCOUNTER — Other Ambulatory Visit: Payer: Self-pay

## 2019-05-28 ENCOUNTER — Encounter: Payer: Self-pay | Admitting: Neurology

## 2019-05-28 VITALS — BP 170/82 | HR 61 | Temp 97.8°F | Ht 60.0 in | Wt 149.0 lb

## 2019-05-28 DIAGNOSIS — G250 Essential tremor: Secondary | ICD-10-CM | POA: Diagnosis not present

## 2019-05-28 NOTE — Progress Notes (Signed)
Subjective:    Patient ID: Mary Wilkinson is a 73 y.o. female.  HPI     Huston Foley, MD, PhD [facility] [address], [address] [address] [place], [place] [address]  Dear Dr. Jeanice Lim,   I saw your patient, Mary Wilkinson, upon your kind request in my neurologic clinic today for initial consultation of her tremor.  The patient is unaccompanied today.  As you know, Mary Wilkinson is a 73 year old right-handed woman with an underlying medical history of hypertension, hyperlipidemia, arthritis, allergies and overweight state, who reports a Several year history of head tremor.  She had seen Dr. Gerilyn Pilgrim last year for this in July 2019.  She has not been on any symptomatic treatment for her tremor.  She did not follow-up with him.  She reports a family history of tremors affecting her brother and her father.  I reviewed your office note from 05/16/2019.  She had blood work through your office on 05/16/2019 and I reviewed the results: Lipid panel was benign, CBC and CMP unremarkable as well. She had a brain MRI without contrast on 06/18/2014 and I reviewed the results:   IMPRESSION: No acute finding. No cause of headache identified. Few small hemispheric white matter foci consistent with minimal small vessel change, frequently seen in healthy individuals of this age. She reports no recent fall, no recent balance problems.  The head tremor bothers her primarily at night when she is trying to relax and it feels like it makes her whole body shake.  She denies any hand tremors or tremors in her feet.  She denies any one-sided weakness or numbness or tingling or droopy face or slurring of speech.   Her Past Medical History Is Significant For: Past Medical History:  Diagnosis Date  . Allergy   . Arthritis    left knee  . Hyperlipidemia   . Hypertension     Her Past Surgical History Is Significant For: Past Surgical History:  Procedure Laterality Date  . ABDOMINAL HYSTERECTOMY    .  KNEE SURGERY     left    Her Family History Is Significant For: Family History  Problem Relation Age of Onset  . Heart disease Mother   . Colon cancer Neg Hx   . Esophageal cancer Neg Hx   . Stomach cancer Neg Hx     Her Social History Is Significant For: Social History   Socioeconomic History  . Marital status: Married    Spouse name: Not on file  . Number of children: 4  . Years of education: Not on file  . Highest education level: Not on file  Occupational History  . Occupation: Retired  Tobacco Use  . Smoking status: Never Smoker  . Smokeless tobacco: Never Used  Substance and Sexual Activity  . Alcohol use: No    Alcohol/week: 0.0 standard drinks  . Drug use: No  . Sexual activity: Never  Other Topics Concern  . Not on file  Social History Narrative  . Not on file   Social Determinants of Health   Financial Resource Strain:   . Difficulty of Paying Living Expenses: Not on file  Food Insecurity:   . Worried About Programme researcher, broadcasting/film/video in the Last Year: Not on file  . Ran Out of Food in the Last Year: Not on file  Transportation Needs:   . Lack of Transportation (Medical): Not on file  . Lack of Transportation (Non-Medical): Not on file  Physical Activity:   .  Days of Exercise per Week: Not on file  . Minutes of Exercise per Session: Not on file  Stress:   . Feeling of Stress : Not on file  Social Connections:   . Frequency of Communication with Friends and Family: Not on file  . Frequency of Social Gatherings with Friends and Family: Not on file  . Attends Religious Services: Not on file  . Active Member of Clubs or Organizations: Not on file  . Attends Archivist Meetings: Not on file  . Marital Status: Not on file    Her Allergies Are:  No Known Allergies:   Her Current Medications Are:  Outpatient Encounter Medications as of 05/28/2019  Medication Sig  . aspirin 325 MG EC tablet Take 325 mg by mouth daily.  Marland Kitchen atorvastatin (LIPITOR) 40  MG tablet TAKE 1 TABLET DAILY  . Calcium Carbonate-Vit D-Min (CALCIUM 600 + MINERALS) 600-200 MG-UNIT TABS Take 1 tablet by mouth daily.  Marland Kitchen estradiol (ESTRACE) 0.5 MG tablet TAKE 1 TABLET DAILY  . lisinopril (ZESTRIL) 40 MG tablet TAKE 1 TABLET DAILY  . Omega-3 Fatty Acids (FISH OIL PO) Take 1 tablet by mouth daily.    . vitamin B-12 (CYANOCOBALAMIN) 100 MCG tablet Take 50 mcg by mouth daily.   No facility-administered encounter medications on file as of 05/28/2019.  : Review of Systems:  Out of a complete 14 point review of systems, all are reviewed and negative with the exception of these symptoms as listed below:  Review of Systems  Neurological:       Pt presents today to discuss her head tremor. Pt thinks that the head tremor is worse than last year. Pt complains of tremors all over her body when she tries to lie down. She is right handed.    Objective:  Neurological Exam  Physical Exam Physical Examination:   Vitals:   05/28/19 1404  BP: (!) 170/82  Pulse: 61  Temp: 97.8 F (36.6 C)    General Examination: The patient is a very pleasant 73 y.o. female in no acute distress. She appears well-developed and well-nourished and well groomed.   HEENT: Normocephalic, atraumatic, pupils are equal, round and reactive to light and accommodation. Status post cataract surgeries bilaterally.  Extraocular tracking is well preserved, hearing is grossly intact. She has an intermittent side-to-side head tremor and an intermittent lower lip tremor, no obvious voice tremor, no hypophonia, no dysarthria.  Face is symmetric with normal facial animation, no carotid bruits on auscultation. Oropharynx exam reveals: mild mouth dryness, adequate dental hygiene. Tongue protrudes centrally and palate elevates symmetrically.  Chest: Clear to auscultation without wheezing, rhonchi or crackles noted.  Heart: S1+S2+0, regular and normal without murmurs, rubs or gallops noted.   Abdomen: Soft, non-tender  and non-distended with normal bowel sounds appreciated on auscultation.  Extremities: There is no pitting edema in the distal lower extremities bilaterally. Pedal pulses are intact.  Skin: Warm and dry without trophic changes noted.  Musculoskeletal: exam reveals no obvious joint deformities, tenderness or joint swelling or erythema.   Neurologically:  Mental status: The patient is awake, alert and oriented in all 4 spheres. Her immediate and remote memory, attention, language skills and fund of knowledge are appropriate. There is no evidence of aphasia, agnosia, apraxia or anomia. Speech is clear with normal prosody and enunciation. Thought process is linear. Mood is normal and affect is normal.  Cranial nerves II - XII are as described above under HEENT exam. In addition: shoulder shrug is normal  with equal shoulder height noted. Motor exam: Normal bulk, strength and tone is noted. There is no drift, No resting, postural or action tremor in the upper extremities, no rebound. Romberg is negative. Reflexes are 2+ throughout. Babinski: Toes are flexor bilaterally. Fine motor skills and coordination: intact with normal finger taps, normal hand movements, normal rapid alternating patting, normal foot taps and normal foot agility.  Cerebellar testing: No dysmetria or intention tremor on finger to nose testing. Heel to shin is unremarkable bilaterally. There is no truncal or gait ataxia.  Sensory exam: intact to light touch in the upper and lower extremities.  Gait, station and balance: She stands easily. No veering to one side is noted. No leaning to one side is noted. Posture is age-appropriate and stance is narrow based. Gait shows normal stride length and normal pace. No problems turning are noted. Tandem walk is unremarkable.     Assessment and Plan:   In summary, Mary Wilkinson is a very pleasant 73 y.o.-year old female with an underlying medical history of hypertension, hyperlipidemia, arthritis,  allergies and overweight state, who Presents for evaluation of her head tremors of at least 3 years duration.  It bothers her, primarily from the social standpoint and bothers her at night when she tries to relax and her head bobbing or shaking side to side makes her whole body shake.  She has no obvious hand tremor. No signs of parkinsonism and she is reassured in that.  She reports a family history of tremor affecting her brother and her father.  She likely has essential tremor with primarily head tremor.  It is on the milder side and she is largely reassured today.  We did talk about potential symptomatic treatment options such as a beta-blocker and Mysoline.  I would favor the watchful waiting approach.  A beta-blocker may reduce her heart rate to the point of bradycardia. We could consider Mysoline in the near future.  She is advised that typically midline or head tremors including voice tremors or lip tremors can be more challenging to treat compared to hand tremors.  For now, we mutually agreed to monitor her symptoms and examination and she is advised to follow-up routinely in 6 months, sooner if needed. She is advised regarding typical tremor triggers.  She is advised to follow-up with your office as scheduled and also pursue a blood test for thyroid function with her next blood work. I answered all her questions today and she was in agreement with the plan.   Thank you very much for allowing me to participate in the care of this nice patient. If I can be of any further assistance to you please do not hesitate to call me at [phone].  Sincerely,   Huston FoleySaima Gaytha Raybourn, MD, PhD

## 2019-05-28 NOTE — Patient Instructions (Addendum)
You have a rather mild and intermittent tremor of the head and lips.   I do not see any signs or symptoms of parkinson's like disease or what we call parkinsonism.   For your tremor, I would not recommend any new medication for fear of side effects. I would suggest we reevaluate you in 6 months. You likely have what we call essential tremor   Please remember, that any kind of tremor may be exacerbated by anxiety, anger, nervousness, excitement, dehydration, sleep deprivation, by caffeine, and low blood sugar values or blood sugar fluctuations. With your next routine blood work I would recommend that you have your thyroid function tested with your primary care.

## 2019-06-02 ENCOUNTER — Other Ambulatory Visit: Payer: Self-pay | Admitting: Family Medicine

## 2019-06-04 ENCOUNTER — Telehealth: Payer: Self-pay | Admitting: *Deleted

## 2019-06-04 NOTE — Telephone Encounter (Signed)
Received call from patient.   Reports that she was seen at Neurology and noted to have increased BP. (170/ 82). States that she bought BP monitor, and readings have been variant. Reports that lowest BP reading noted at 150, while highest remains 170.   Reports that she is taking Lisinopril 40mg  PO Q AM. Reports that she does have frequent HA in the back of her head, but denies vision changes, chest pain or SOB.   Attempted to have patient check BP while on the phone. Unable to obtain reading due to issues with monitor. Advised patient to bring monitor in for nurse visit to check. Appointment scheduled for nurse visit on 06/05/2019.

## 2019-06-05 ENCOUNTER — Other Ambulatory Visit: Payer: Self-pay

## 2019-06-05 ENCOUNTER — Ambulatory Visit: Payer: BC Managed Care – PPO | Admitting: *Deleted

## 2019-06-05 DIAGNOSIS — I10 Essential (primary) hypertension: Secondary | ICD-10-CM

## 2019-06-05 MED ORDER — HYDROCHLOROTHIAZIDE 25 MG PO TABS: 25.0000 mg | ORAL_TABLET | Freq: Every day | ORAL | 3 refills | Status: DC

## 2019-06-05 NOTE — Progress Notes (Signed)
Patient seen in office to check her BP and calibrate her monitor.   Manual BP: R arm: 154/78 L arm:  150/78  Automatic Wrist Cuff: R Arm: 154/85 L arm:  152/87  Monitor noted to be within range. MD to be made aware of elevated BP.

## 2019-06-05 NOTE — Telephone Encounter (Signed)
Patient seen in office to check her BP and calibrate her monitor.   Manual BP: R arm: 154/78 L arm:  150/78  Automatic Wrist Cuff: R Arm: 154/85 L arm:  152/87  Monitor noted to be within range.  Patient denies dizziness or vision changes, but does report slight HA.   Patient has taken Lisinopril 40mg  today.    MD to be made aware of elevated BP.

## 2019-06-05 NOTE — Telephone Encounter (Signed)
BP appears elevated, Add hydrochlorothiazide 25 mg poqd.

## 2019-06-05 NOTE — Telephone Encounter (Signed)
Call placed to patient and patient made aware.   Prescription sent to pharmacy.   Patient will call in 1 week with BP readings.

## 2019-06-11 ENCOUNTER — Telehealth: Payer: Self-pay | Admitting: *Deleted

## 2019-06-11 NOTE — Telephone Encounter (Signed)
Call placed to patient. LMTRC.  

## 2019-06-11 NOTE — Telephone Encounter (Signed)
Received call from patient.   Reports that BP readings are as follows: 23-Dec 147/82  24-Dec 135/77  25-Dec 145/86  26-Dec 127/71  27-Dec 143/83  28-Dec 139/77    Reports that she is currently taking Lisinopril 40mg  and HCTZ 25mg .  MD to be made aware.

## 2019-06-11 NOTE — Telephone Encounter (Signed)
Blood pressure looks good, no changes

## 2019-06-12 NOTE — Telephone Encounter (Signed)
Spoke with patient and informed her no changes to medications BP looks good. Patient verbalized understanding.

## 2019-06-29 ENCOUNTER — Other Ambulatory Visit: Payer: Self-pay

## 2019-06-29 ENCOUNTER — Ambulatory Visit (INDEPENDENT_AMBULATORY_CARE_PROVIDER_SITE_OTHER): Payer: Medicare Other | Admitting: Family Medicine

## 2019-06-29 VITALS — Temp 98.3°F | Resp 18 | Ht 60.0 in | Wt 144.8 lb

## 2019-06-29 DIAGNOSIS — I959 Hypotension, unspecified: Secondary | ICD-10-CM | POA: Diagnosis not present

## 2019-06-29 DIAGNOSIS — R531 Weakness: Secondary | ICD-10-CM

## 2019-06-29 NOTE — Patient Instructions (Signed)
Stop the HCTZ  If blood pressure is > 140/90, restart the HCTZ at 1/2 tablet Continue the lisinopril F/U 4 weeks

## 2019-06-29 NOTE — Progress Notes (Signed)
   Subjective:    Patient ID: Mary Wilkinson, female    DOB: 1945-06-22, 74 y.o.   MRN: [id number]  Patient presents for Blood Pressure Check (bp running low for 4 days)   Tuesday she had taken her husband to chemotherapy, she felt woozy. Wed started feeling worse, more fatigued. Her blood pressure 84/65. No cough, congestion, headache, SOB.  She did have a vertigo like sensation as well on Wed. She felt better around 5pm and went to her Wed night service   Yesterday had a weak sensation in bilat arms, passed after a few minutes. The dizziness had let up.   She has been eating okay but then states she has been on a fast with her church for the past week, only fruits, veggies, occ eggs    No known sick contacts   Her newest medication is HCTZ   Review Of Systems:  GEN- +fatigue, denies  fever, weight loss,weakness, recent illness HEENT- denies eye drainage, change in vision, nasal discharge, CVS- denies chest pain, palpitations RESP- denies SOB, cough, wheeze ABD- denies N/V, change in stools, abd pain GU- denies dysuria, hematuria, dribbling, incontinence MSK- denies joint pain, muscle aches, injury Neuro- denies headache,+ dizziness, syncope, seizure activity       Objective:    Temp 98.3 F (36.8 C) (Oral)   Resp 18   Ht 5' (1.524 m)   Wt 144 lb 12.8 oz (65.7 kg)   SpO2 99%   BMI 28.28 kg/m  GEN- NAD, alert and oriented x3, well appearing  HEENT- PERRL, EOMI, non injected sclera, pink conjunctiva, MMM, oropharynx clear Neck- Supple, no thyromegaly, no bruit  CVS- RRR, no murmur RESP-CTAB NEURO- CNII-XII in tact, no focal deficits , chronic mild head bob ABD-NABS,soft,NT,ND EXT- No edema Pulses- Radial  2+        Assessment & Plan:      Problem List Items Addressed This Visit    None    Visit Diagnoses    Hypotension, unspecified hypotension type    -  Primary   Concern BP has been dropping in setting of her fast. advised she needs to eat more food, when  she feels weak, will also go ahead and D/C the HCTZ for now. If bp > 140/90 consistently will restart at  12.5mg     Weakness          Note: This dictation was prepared with Dragon dictation along with smaller phrase technology. Any transcriptional errors that result from this process are unintentional.

## 2019-07-01 ENCOUNTER — Encounter: Payer: Self-pay | Admitting: Family Medicine

## 2019-07-27 ENCOUNTER — Ambulatory Visit (INDEPENDENT_AMBULATORY_CARE_PROVIDER_SITE_OTHER): Payer: BC Managed Care – PPO | Admitting: Family Medicine

## 2019-07-27 ENCOUNTER — Other Ambulatory Visit: Payer: Self-pay

## 2019-07-27 ENCOUNTER — Encounter: Payer: Self-pay | Admitting: Family Medicine

## 2019-07-27 VITALS — BP 136/70 | HR 72 | Temp 98.1°F | Resp 14 | Ht 60.0 in | Wt 146.0 lb

## 2019-07-27 DIAGNOSIS — R252 Cramp and spasm: Secondary | ICD-10-CM

## 2019-07-27 DIAGNOSIS — I10 Essential (primary) hypertension: Secondary | ICD-10-CM | POA: Diagnosis not present

## 2019-07-27 NOTE — Assessment & Plan Note (Signed)
Blood pressure looks good on just lisinopril 40 mg once a day.  She can continue to monitor.  We will stay off the hydrochlorothiazide.

## 2019-07-27 NOTE — Progress Notes (Signed)
   Subjective:    Patient ID: Mary Wilkinson. Coachman, female    DOB: 03-04-1946, 74 y.o.   MRN: [id number]  Patient presents for Follow-up (is not fasting) Patient here for interim follow-up on her blood pressure.  The last visit he was having dizzy weak spells.  Her blood pressure was dropping low I had her stop hydrochlorothiazide.  She was also on a fast with her chart. Blood pressure readings over the past week with just taking lisinopril 40 mg once a day 129/73 , 138//78, 111/66,  131/168 , WED 145/82, 119/74   Her other concern is that she has had increased pain in back of her calves.  Has a aching in the muscles.  Her husband is sick with cancer she is now bringing in the firewood she is going up and down the stairs definitely more manual labor than she is typically does.  Denies any new back pain no change in bowel or bladder.  SHe has not taken any medication for this She has not noticed any leg swelling    Review Of Systems:  GEN- denies fatigue, fever, weight loss,weakness, recent illness HEENT- denies eye drainage, change in vision, nasal discharge, CVS- denies chest pain, palpitations RESP- denies SOB, cough, wheeze ABD- denies N/V, change in stools, abd pain GU- denies dysuria, hematuria, dribbling, incontinence MSK- denies joint pain, +muscle aches, injury Neuro- denies headache, dizziness, syncope, seizure activity       Objective:    BP 136/70   Pulse 72   Temp 98.1 F (36.7 C) (Temporal)   Resp 14   Ht 5' (1.524 m)   Wt 146 lb (66.2 kg)   SpO2 100%   BMI 28.51 kg/m  GEN- NAD, alert and oriented x3 HEENT- PERRL, EOMI, non injected sclera, pink conjunctiva CVS- RRR, no murmur RESP-CTAB EXT- No edema, neg HOMANS MSK- Fair ROM knees, ankles, hips Pulses- Radial, DP- 2+        Assessment & Plan:      Problem List Items Addressed This Visit      Unprioritized   HTN (hypertension) - Primary    Blood pressure looks good on just lisinopril 40 mg once a day.   She can continue to monitor.  We will stay off the hydrochlorothiazide.       Other Visit Diagnoses    Leg cramps       No sign of DVT, she has known OA as well, I think musclar pain from increased manual activity, can try mag 250mg  daily, or topical muscle rub      Note: This dictation was prepared with Dragon dictation along with smaller phrase technology. Any transcriptional errors that result from this process are unintentional.

## 2019-07-27 NOTE — Patient Instructions (Addendum)
Magnesium 250mg  once a day for leg cramps Stay off the HCTZ F/U 4 months for June

## 2019-07-31 ENCOUNTER — Other Ambulatory Visit: Payer: Self-pay | Admitting: Family Medicine

## 2019-11-26 ENCOUNTER — Ambulatory Visit: Payer: BC Managed Care – PPO | Admitting: Neurology

## 2019-11-27 ENCOUNTER — Ambulatory Visit (HOSPITAL_COMMUNITY)
Admission: RE | Admit: 2019-11-27 | Discharge: 2019-11-27 | Disposition: A | Discharge status: Home / Self Care | Payer: Medicare Other | Source: Ambulatory Visit | Attending: Family Medicine | Admitting: Family Medicine

## 2019-11-27 ENCOUNTER — Other Ambulatory Visit: Payer: Self-pay

## 2019-11-27 ENCOUNTER — Ambulatory Visit (INDEPENDENT_AMBULATORY_CARE_PROVIDER_SITE_OTHER): Payer: Medicare Other | Admitting: Family Medicine

## 2019-11-27 ENCOUNTER — Encounter: Payer: Self-pay | Admitting: Family Medicine

## 2019-11-27 VITALS — BP 122/62 | HR 72 | Temp 97.8°F | Resp 14 | Ht 60.0 in | Wt 150.0 lb

## 2019-11-27 DIAGNOSIS — E7800 Pure hypercholesterolemia, unspecified: Secondary | ICD-10-CM | POA: Diagnosis not present

## 2019-11-27 DIAGNOSIS — N951 Menopausal and female climacteric states: Secondary | ICD-10-CM | POA: Diagnosis not present

## 2019-11-27 DIAGNOSIS — M25561 Pain in right knee: Secondary | ICD-10-CM | POA: Insufficient documentation

## 2019-11-27 DIAGNOSIS — I10 Essential (primary) hypertension: Secondary | ICD-10-CM | POA: Diagnosis not present

## 2019-11-27 DIAGNOSIS — M79661 Pain in right lower leg: Secondary | ICD-10-CM | POA: Diagnosis not present

## 2019-11-27 DIAGNOSIS — Z0001 Encounter for general adult medical examination with abnormal findings: Secondary | ICD-10-CM

## 2019-11-27 DIAGNOSIS — G8929 Other chronic pain: Secondary | ICD-10-CM | POA: Diagnosis not present

## 2019-11-27 DIAGNOSIS — Z0000 Encounter for general adult medical examination without abnormal findings: Secondary | ICD-10-CM

## 2019-11-27 NOTE — Progress Notes (Signed)
Subjective:   Patient presents for Medicare Annual/Subsequent preventive examination.    Right leg pain, now  pain is on medical side knee, no swelling of knee Previously had pain in right calf but that resolved with magnesium  Pain with walking, climbing steps    Post menopausal- taking estrace, should would like to taper off this winter   Hyperlipidemia taking statin drug   HTN- taking lisinopril     She has had noticed some vision changes plans to schedule with her eye doctor  She has some nodules on her fingers get some soreness, has been present for quite some time,  Hands get stiff   Husband still going through cancer treatments    Review Past Medical/Family/Social: [facility] EMR    Risk Factors  Current exercise habits: walks some  Dietary issues discussed: No major concerns   Cardiac risk factors: HTN, HLD   Depression Screen  (Note: if answer to either of the following is "Yes", a more complete depression screening is indicated)  Over the past two weeks, have you felt down, depressed or hopeless? No Over the past two weeks, have you felt little interest or pleasure in doing things? No Have you lost interest or pleasure in daily life? No Do you often feel hopeless? No Do you cry easily over simple problems? No   Activities of Daily Living  In your present state of health, do you have any difficulty performing the following activities?:  Driving? No  Managing money? No  Feeding yourself? No  Getting from bed to chair? No  Climbing a flight of stairs? Yes Preparing food and eating?: No  Bathing or showering? No  Getting dressed: No  Getting to the toilet? No  Using the toilet:No  Moving around from place to place: No  In the past year have you fallen or had a near fall?:No  Are you sexually active? No  Do you have more than one partner? No   Hearing Difficulties: No  Do you often ask people to speak up or repeat themselves? No  Do you experience ringing or  noises in your ears? No Do you have difficulty understanding soft or whispered voices? No  Do you feel that you have a problem with memory? No Do you often misplace items? No  Do you feel safe at home? Yes  Cognitive Testing  Alert? Yes Normal Appearance?Yes  Oriented to person? Yes Place? Yes  Time? Yes  Recall of three objects? Yes  Can perform simple calculations? Yes  Displays appropriate judgment?Yes  Can read the correct time from a watch face?Yes   List the Names of Other Physician/Practitioners you currently use:  NEUROLOGY  Screening Tests / Date Colonoscopy   Done August 2012                  Zostavax Declines  Mammogram UTD  Influenza Vaccine Declines  Tetanus/tdap UTD  Bone Density - UTD  Pnneumonia  UTD  ROS: GEN- denies fatigue, fever, weight loss,weakness, recent illness HEENT- denies eye drainage, change in vision, nasal discharge, CVS- denies chest pain, palpitations RESP- denies SOB, cough, wheeze ABD- denies N/V, change in stools, abd pain GU- denies dysuria, hematuria, dribbling, incontinence MSK- denies joint pain, muscle aches, injury Neuro- denies headache, dizziness, syncope, seizure activity  Physical : Vitals reviewed  GEN- NAD, alert and oriented x3 HEENT- PERRL, EOMI, non injected sclera, pink conjunctiva, MMM, oropharynx clear Neck- Supple, no thryomegaly CVS- RRR, no murmur RESP-CTAB ABD-NABS,soft,NT,ND MSK- Fair ROM  bilat knees, Right TTP medial aspect knee, no effusion, +crepitus,ligaments grossly in tact  bilat fingers scattered nodules near PIP bilat hands  EXT- No edema Pulses- Radial, DP- 2+   Assessment:    Annual wellness medicare exam   Plan:    During the course of the visit the patient was educated and counseled about appropriate screening and preventive services including:   FALL/DEPRESSION/CAGE screen negative   HTn controlled, NO CHANGES   Right leg/knee pain- obtain xrays, expect related to arthritis, calf  spasm was muscular and thar resolved  OA hands with nodules/cyst- discussed referral to hand specialist she wants to defer, can use topical NSAID    Post menopausal syndrome, mammogram UTD- plan to start to taper off hormones in the winter    Hyperlipidemia recent lipids at goal,no changes  Schedule with eye doctor   FULL CODE, she is working on living will with husband   Discused COVID-19 vaccine pt declines   Diet review for nutrition referral? Yes ____ Not Indicated __x__  Patient Instructions (the written plan) was given to the patient.  Medicare Attestation  I have personally reviewed:  The patient's medical and social history  Their use of alcohol, tobacco or illicit drugs  Their current medications and supplements  The patient's functional ability including ADLs,fall risks, home safety risks, cognitive, and hearing and visual impairment  Diet and physical activities  Evidence for depression or mood disorders  The patient's weight, height, BMI, and visual acuity have been recorded in the chart. I have made referrals, counseling, and provided education to the patient based on review of the above and I have provided the patient with a written personalized care plan for preventive services.

## 2019-11-27 NOTE — Patient Instructions (Addendum)
Go to [facility] to get xrays of your knee/leg  Continue your current medications We will call with lab results  Try Voltaren gel to your fingers for arthritis pain  F/U 4 months

## 2019-11-27 NOTE — Assessment & Plan Note (Signed)
Post menopausal syndrome, mammogram UTD- plan to start to taper off hormones in the winter

## 2019-11-28 ENCOUNTER — Other Ambulatory Visit: Payer: Self-pay | Admitting: *Deleted

## 2019-11-28 DIAGNOSIS — M25561 Pain in right knee: Secondary | ICD-10-CM

## 2019-11-28 DIAGNOSIS — M1711 Unilateral primary osteoarthritis, right knee: Secondary | ICD-10-CM

## 2019-11-28 LAB — CBC WITH DIFFERENTIAL/PLATELET
Absolute Monocytes: 488 cells/uL (ref 200–950)
Basophils Absolute: 18 cells/uL (ref 0–200)
Basophils Relative: 0.4 %
Eosinophils Absolute: 101 cells/uL (ref 15–500)
Eosinophils Relative: 2.2 %
HCT: 39.9 % (ref 35.0–45.0)
Hemoglobin: 12.6 g/dL (ref 11.7–15.5)
Lymphs Abs: 1845 cells/uL (ref 850–3900)
MCH: 27.4 pg (ref 27.0–33.0)
MCHC: 31.6 g/dL — ABNORMAL LOW (ref 32.0–36.0)
MCV: 86.7 fL (ref 80.0–100.0)
MPV: 10.5 fL (ref 7.5–12.5)
Monocytes Relative: 10.6 %
Neutro Abs: 2148 cells/uL (ref 1500–7800)
Neutrophils Relative %: 46.7 %
Platelets: 244 10*3/uL (ref 140–400)
RBC: 4.6 10*6/uL (ref 3.80–5.10)
RDW: 12.8 % (ref 11.0–15.0)
Total Lymphocyte: 40.1 %
WBC: 4.6 10*3/uL (ref 3.8–10.8)

## 2019-11-28 LAB — COMPREHENSIVE METABOLIC PANEL
AG Ratio: 1.6 (calc) (ref 1.0–2.5)
ALT: 17 U/L (ref 6–29)
AST: 21 U/L (ref 10–35)
Albumin: 4.2 g/dL (ref 3.6–5.1)
Alkaline phosphatase (APISO): 55 U/L (ref 37–153)
BUN: 10 mg/dL (ref 7–25)
CO2: 27 mmol/L (ref 20–32)
Calcium: 9.8 mg/dL (ref 8.6–10.4)
Chloride: 105 mmol/L (ref 98–110)
Creat: 0.88 mg/dL (ref 0.60–0.93)
Globulin: 2.6 g/dL (calc) (ref 1.9–3.7)
Glucose, Bld: 81 mg/dL (ref 65–99)
Potassium: 4.2 mmol/L (ref 3.5–5.3)
Sodium: 139 mmol/L (ref 135–146)
Total Bilirubin: 0.8 mg/dL (ref 0.2–1.2)
Total Protein: 6.8 g/dL (ref 6.1–8.1)

## 2019-12-05 ENCOUNTER — Telehealth: Payer: Self-pay | Admitting: *Deleted

## 2019-12-05 MED ORDER — TRAMADOL HCL 50 MG PO TABS: 50.0000 mg | ORAL_TABLET | Freq: Two times a day (BID) | ORAL | 0 refills | Status: AC | PRN

## 2019-12-05 NOTE — Telephone Encounter (Signed)
Ultram placed for pain

## 2019-12-05 NOTE — Telephone Encounter (Signed)
Call placed to patient and patient made aware.  

## 2019-12-05 NOTE — Telephone Encounter (Signed)
Received VM from patient.   Reports that she continues to have pain in L leg D/T OA.   Requested medication to help ease pain until she can be seen by specialist.   MD please advise.

## 2019-12-18 ENCOUNTER — Other Ambulatory Visit: Payer: Self-pay

## 2019-12-18 ENCOUNTER — Ambulatory Visit (INDEPENDENT_AMBULATORY_CARE_PROVIDER_SITE_OTHER): Payer: Medicare Other | Admitting: Orthopaedic Surgery

## 2019-12-18 ENCOUNTER — Encounter: Payer: Self-pay | Admitting: Orthopaedic Surgery

## 2019-12-18 VITALS — BP 166/91 | HR 62 | Ht 64.0 in | Wt 147.0 lb

## 2019-12-18 DIAGNOSIS — G8929 Other chronic pain: Secondary | ICD-10-CM | POA: Diagnosis not present

## 2019-12-18 DIAGNOSIS — M25561 Pain in right knee: Secondary | ICD-10-CM | POA: Diagnosis not present

## 2019-12-18 MED ORDER — NAPROXEN 500 MG PO TABS: 500.0000 mg | ORAL_TABLET | Freq: Two times a day (BID) | ORAL | 5 refills | Status: DC

## 2019-12-18 NOTE — Progress Notes (Signed)
Subjective:    Patient ID: Mary Wilkinson, female    DOB: 06-06-1946, 74 y.o.   MRN: [id number]  HPI She has right knee pain which has been present for six months or more.  She has seen her family doctor. I have reviewed the notes.  I have independently reviewed and interpreted x-rays of this patient done at another site by another physician or qualified health professional.  She has medial right knee pain, some swelling, some popping but no trauma.  She has no redness, no distal edema.  Tylenol and ice have not helped.  She has no giving way.   Review of Systems  Constitutional: Positive for activity change.  Musculoskeletal: Positive for arthralgias, gait problem and joint swelling.  Allergic/Immunologic: Positive for environmental allergies.  All other systems reviewed and are negative.  For Review of Systems, all other systems reviewed and are negative.  The following is a summary of the past history medically, past history surgically, known current medicines, social history and family history.  This information is gathered electronically by the computer from prior information and documentation.  I review this each visit and have found including this information at this point in the chart is beneficial and informative.   Past Medical History:  Diagnosis Date  . Allergy   . Arthritis    left knee  . Hyperlipidemia   . Hypertension     Past Surgical History:  Procedure Laterality Date  . ABDOMINAL HYSTERECTOMY    . KNEE SURGERY     left    Current Outpatient Medications on File Prior to Visit  Medication Sig Dispense Refill  . atorvastatin (LIPITOR) 40 MG tablet TAKE 1 TABLET DAILY 90 tablet 2  . Biotin w/ Vitamins C & E (HAIR/SKIN/NAILS PO) Take by mouth.    . Calcium Carbonate-Vit D-Min (CALCIUM 600 + MINERALS) 600-200 MG-UNIT TABS Take 1 tablet by mouth daily.    Marland Kitchen estradiol (ESTRACE) 0.5 MG tablet TAKE 1 TABLET DAILY 90 tablet 2  . lisinopril (ZESTRIL) 40 MG tablet  TAKE 1 TABLET DAILY 90 tablet 2  . Omega-3 Fatty Acids (FISH OIL PO) Take 1 tablet by mouth daily.       No current facility-administered medications on file prior to visit.    Social History   Socioeconomic History  . Marital status: Married    Spouse name: Not on file  . Number of children: 4  . Years of education: Not on file  . Highest education level: Not on file  Occupational History  . Occupation: Retired  Tobacco Use  . Smoking status: Never Smoker  . Smokeless tobacco: Never Used  Substance and Sexual Activity  . Alcohol use: No    Alcohol/week: 0.0 standard drinks  . Drug use: No  . Sexual activity: Never  Other Topics Concern  . Not on file  Social History Narrative  . Not on file   Social Determinants of Health   Financial Resource Strain:   . Difficulty of Paying Living Expenses:   Food Insecurity:   . Worried About Programme researcher, broadcasting/film/video in the Last Year:   . Barista in the Last Year:   Transportation Needs:   . Freight forwarder (Medical):   Marland Kitchen Lack of Transportation (Non-Medical):   Physical Activity:   . Days of Exercise per Week:   . Minutes of Exercise per Session:   Stress:   . Feeling of Stress :   Social Connections:   .  Frequency of Communication with Friends and Family:   . Frequency of Social Gatherings with Friends and Family:   . Attends Religious Services:   . Active Member of Clubs or Organizations:   . Attends Banker Meetings:   Marland Kitchen Marital Status:   Intimate Partner Violence:   . Fear of Current or Ex-Partner:   . Emotionally Abused:   Marland Kitchen Physically Abused:   . Sexually Abused:     Family History  Problem Relation Age of Onset  . Heart disease Mother   . Colon cancer Neg Hx   . Esophageal cancer Neg Hx   . Stomach cancer Neg Hx     BP (!) 166/91   Pulse 62   Ht 5\' 4"  (1.626 m)   Wt 147 lb (66.7 kg)   BMI 25.23 kg/m   Body mass index is 25.23 kg/m.      Objective:   Physical Exam Vitals  and nursing note reviewed.  Constitutional:      Appearance: She is well-developed.  HENT:     Head: Normocephalic and atraumatic.  Eyes:     Conjunctiva/sclera: Conjunctivae normal.     Pupils: Pupils are equal, round, and reactive to light.  Cardiovascular:     Rate and Rhythm: Normal rate and regular rhythm.  Pulmonary:     Effort: Pulmonary effort is normal.  Abdominal:     Palpations: Abdomen is soft.  Musculoskeletal:     Cervical back: Normal range of motion and neck supple.       Legs:  Skin:    General: Skin is warm and dry.  Neurological:     Mental Status: She is alert and oriented to person, place, and time.     Cranial Nerves: No cranial nerve deficit.     Motor: No abnormal muscle tone.     Coordination: Coordination normal.     Deep Tendon Reflexes: Reflexes are normal and symmetric. Reflexes normal.  Psychiatric:        Behavior: Behavior normal.        Thought Content: Thought content normal.        Judgment: Judgment normal.           Assessment & Plan:   Encounter Diagnosis  Name Primary?  . Chronic pain of right knee Yes   PROCEDURE NOTE:  The patient requests injections of the right knee , verbal consent was obtained.  The right knee was prepped appropriately after time out was performed.   Sterile technique was observed and injection of 1 cc of Depo-Medrol 40 mg with several cc's of plain xylocaine. Anesthesia was provided by ethyl chloride and a 20-gauge needle was used to inject the knee area. The injection was tolerated well.  A band aid dressing was applied.  The patient was advised to apply ice later today and tomorrow to the injection sight as needed.  Begin Naprosyn 500 po bid pc.  Return in three weeks.  Consider MRI if not improved.  Call if any problem.  Precautions discussed.   Electronically Signed , MD 7/6/202110:25 AM

## 2020-01-10 ENCOUNTER — Other Ambulatory Visit: Payer: Self-pay

## 2020-01-10 ENCOUNTER — Encounter: Payer: Self-pay | Admitting: Orthopaedic Surgery

## 2020-01-10 ENCOUNTER — Ambulatory Visit (INDEPENDENT_AMBULATORY_CARE_PROVIDER_SITE_OTHER): Payer: Medicare Other | Admitting: Orthopaedic Surgery

## 2020-01-10 VITALS — BP 176/87 | HR 66 | Ht <= 58 in | Wt 148.0 lb

## 2020-01-10 DIAGNOSIS — G8929 Other chronic pain: Secondary | ICD-10-CM | POA: Diagnosis not present

## 2020-01-10 DIAGNOSIS — M25561 Pain in right knee: Secondary | ICD-10-CM

## 2020-01-10 MED ORDER — HYDROCODONE-ACETAMINOPHEN 5-325 MG PO TABS: ORAL_TABLET | ORAL | 0 refills | Status: DC

## 2020-01-10 NOTE — Progress Notes (Signed)
Patient HQ:Mary Wilkinson, female DOB:20-Aug-1945, 74 y.o. BMW:[id number]  Chief Complaint  Patient presents with  . Knee Pain    right knee, s/p inj 7/6, helped some x 2 days but was up on it alot taking husband to hospital day of and after the inj    HPI  Mary Wilkinson is a 74 y.o. female who continues to have knee pain on the right.  She has swelling and giving way.  She is not improved.  She says the Naprosyn has not helped that much.  The injection helped but it has "worn off".  I am concerned about a meniscus tear.  I will get a MRI.  She is agreeable to this.   Body mass index is 33.18 kg/m.  ROS  Review of Systems  Constitutional: Positive for activity change.  Musculoskeletal: Positive for arthralgias, gait problem and joint swelling.  Allergic/Immunologic: Positive for environmental allergies.  All other systems reviewed and are negative.   All other systems reviewed and are negative.  The following is a summary of the past history medically, past history surgically, known current medicines, social history and family history.  This information is gathered electronically by the computer from prior information and documentation.  I review this each visit and have found including this information at this point in the chart is beneficial and informative.    Past Medical History:  Diagnosis Date  . Allergy   . Arthritis    left knee  . Hyperlipidemia   . Hypertension     Past Surgical History:  Procedure Laterality Date  . ABDOMINAL HYSTERECTOMY    . KNEE SURGERY     left    Family History  Problem Relation Age of Onset  . Heart disease Mother   . Colon cancer Neg Hx   . Esophageal cancer Neg Hx   . Stomach cancer Neg Hx     Social History Social History   Tobacco Use  . Smoking status: Never Smoker  . Smokeless tobacco: Never Used  Substance Use Topics  . Alcohol use: No    Alcohol/week: 0.0 standard drinks  . Drug use: No    No Known  Allergies  Current Outpatient Medications  Medication Sig Dispense Refill  . atorvastatin (LIPITOR) 40 MG tablet TAKE 1 TABLET DAILY 90 tablet 2  . Biotin w/ Vitamins C & E (HAIR/SKIN/NAILS PO) Take by mouth.    . Calcium Carbonate-Vit D-Min (CALCIUM 600 + MINERALS) 600-200 MG-UNIT TABS Take 1 tablet by mouth daily.    Marland Kitchen estradiol (ESTRACE) 0.5 MG tablet TAKE 1 TABLET DAILY 90 tablet 2  . lisinopril (ZESTRIL) 40 MG tablet TAKE 1 TABLET DAILY 90 tablet 2  . naproxen (NAPROSYN) 500 MG tablet Take 1 tablet (500 mg total) by mouth 2 (two) times daily with a meal. 60 tablet 5  . Omega-3 Fatty Acids (FISH OIL PO) Take 1 tablet by mouth daily.      Marland Kitchen HYDROcodone-acetaminophen (NORCO/VICODIN) 5-325 MG tablet One tablet every four hours for pain. 30 tablet 0   No current facility-administered medications for this visit.     Physical Exam  Blood pressure (!) 176/87, pulse 66, height 4\' 8"  (1.422 m), weight 148 lb (67.1 kg).  Constitutional: overall normal hygiene, normal nutrition, well developed, normal grooming, normal body habitus. Assistive device:none  Musculoskeletal: gait and station Limp right, muscle tone and strength are normal, no tremors or atrophy is present.  .  Neurological: coordination overall normal.  Deep tendon reflex/nerve  stretch intact.  Sensation normal.  Cranial nerves II-XII intact.   Skin:   Normal overall no scars, lesions, ulcers or rashes. No psoriasis.  Psychiatric: Alert and oriented x 3.  Recent memory intact, remote memory unclear.  Normal mood and affect. Well groomed.  Good eye contact.  Cardiovascular: overall no swelling, no varicosities, no edema bilaterally, normal temperatures of the legs and arms, no clubbing, cyanosis and good capillary refill.  Lymphatic: palpation is normal.  Right knee has swelling, crepitus, ROM 0 to 110, medial joint line pain and positive medial [place].  NV intact.  All other systems reviewed and are negative   The  patient has been educated about the nature of the problem(s) and counseled on treatment options.  The patient appeared to understand what I have discussed and is in agreement with it.  Encounter Diagnosis  Name Primary?  . Chronic pain of right knee Yes    PLAN Call if any problems.  Precautions discussed.  Continue current medications.   Return to clinic 2 weeks   Get MRI right knee.  Electronically Signed Darreld Mclean, MD 7/29/202112:25 PM

## 2020-01-29 ENCOUNTER — Ambulatory Visit: Payer: Medicare Other | Admitting: Orthopaedic Surgery

## 2020-02-01 ENCOUNTER — Other Ambulatory Visit: Payer: Self-pay

## 2020-02-01 ENCOUNTER — Ambulatory Visit (HOSPITAL_COMMUNITY)
Admission: RE | Admit: 2020-02-01 | Discharge: 2020-02-01 | Disposition: A | Discharge status: Home / Self Care | Payer: Medicare Other | Source: Ambulatory Visit | Attending: Orthopaedic Surgery | Admitting: Orthopaedic Surgery

## 2020-02-01 DIAGNOSIS — G8929 Other chronic pain: Secondary | ICD-10-CM | POA: Diagnosis not present

## 2020-02-01 DIAGNOSIS — M25561 Pain in right knee: Secondary | ICD-10-CM | POA: Diagnosis not present

## 2020-02-12 ENCOUNTER — Ambulatory Visit (INDEPENDENT_AMBULATORY_CARE_PROVIDER_SITE_OTHER): Payer: Medicare Other | Admitting: Orthopaedic Surgery

## 2020-02-12 ENCOUNTER — Other Ambulatory Visit: Payer: Self-pay

## 2020-02-12 ENCOUNTER — Encounter: Payer: Self-pay | Admitting: Orthopaedic Surgery

## 2020-02-12 VITALS — BP 163/93 | HR 64 | Ht <= 58 in | Wt 148.0 lb

## 2020-02-12 DIAGNOSIS — M25561 Pain in right knee: Secondary | ICD-10-CM | POA: Diagnosis not present

## 2020-02-12 DIAGNOSIS — G8929 Other chronic pain: Secondary | ICD-10-CM | POA: Diagnosis not present

## 2020-02-12 MED ORDER — MELOXICAM 15 MG PO TABS
15.0000 mg | ORAL_TABLET | Freq: Every day | ORAL | 5 refills | Status: DC
Start: 2020-02-12 — End: 2020-12-08

## 2020-02-12 NOTE — Progress Notes (Signed)
Patient SW:Mary Wilkinson, female DOB:[birth year], 74 y.o. JJK:[id number]  Chief Complaint  Patient presents with  . Knee Pain    right   . Results    review MRI     HPI  Mary Wilkinson is a 74 y.o. female who has right knee pain.  She had MRI of the knee which showed: IMPRESSION: Mild-to-moderate osteoarthritis appears worst in the medial compartment. There is fraying along the free edge of the posterior horn of the medial meniscus and the body is degenerated and diminutive without focal tear identified.  I have independently reviewed the MRI.     I have explained the findings to her.    I will change to Mobic 15 and stop the Naprosyn as the Naprosyn is not helping.   Body mass index is 33.18 kg/m.  ROS  Review of Systems  Constitutional: Positive for activity change.  Musculoskeletal: Positive for arthralgias, gait problem and joint swelling.  Allergic/Immunologic: Positive for environmental allergies.  All other systems reviewed and are negative.   All other systems reviewed and are negative.  The following is a summary of the past history medically, past history surgically, known current medicines, social history and family history.  This information is gathered electronically by the computer from prior information and documentation.  I review this each visit and have found including this information at this point in the chart is beneficial and informative.    Past Medical History:  Diagnosis Date  . Allergy   . Arthritis    left knee  . Hyperlipidemia   . Hypertension     Past Surgical History:  Procedure Laterality Date  . ABDOMINAL HYSTERECTOMY    . KNEE SURGERY     left    Family History  Problem Relation Age of Onset  . Heart disease Mother   . Colon cancer Neg Hx   . Esophageal cancer Neg Hx   . Stomach cancer Neg Hx     Social History Social History   Tobacco Use  . Smoking status: Never Smoker  . Smokeless tobacco: Never Used   Substance Use Topics  . Alcohol use: No    Alcohol/week: 0.0 standard drinks  . Drug use: No    No Known Allergies  Current Outpatient Medications  Medication Sig Dispense Refill  . atorvastatin (LIPITOR) 40 MG tablet TAKE 1 TABLET DAILY 90 tablet 2  . Biotin w/ Vitamins C & E (HAIR/SKIN/NAILS PO) Take by mouth.    . Calcium Carbonate-Vit D-Min (CALCIUM 600 + MINERALS) 600-200 MG-UNIT TABS Take 1 tablet by mouth daily.    Marland Kitchen estradiol (ESTRACE) 0.5 MG tablet TAKE 1 TABLET DAILY 90 tablet 2  . HYDROcodone-acetaminophen (NORCO/VICODIN) 5-325 MG tablet One tablet every four hours for pain. 30 tablet 0  . lisinopril (ZESTRIL) 40 MG tablet TAKE 1 TABLET DAILY 90 tablet 2  . Omega-3 Fatty Acids (FISH OIL PO) Take 1 tablet by mouth daily.      . meloxicam (MOBIC) 15 MG tablet Take 1 tablet (15 mg total) by mouth daily. 30 tablet 5   No current facility-administered medications for this visit.     Physical Exam  Blood pressure (!) 163/93, pulse 64, height 4\' 8"  (1.422 m), weight 148 lb (67.1 kg).  Constitutional: overall normal hygiene, normal nutrition, well developed, normal grooming, normal body habitus. Assistive device:none  Musculoskeletal: gait and station Limp right, muscle tone and strength are normal, no tremors or atrophy is present.  .  Neurological: coordination overall  normal.  Deep tendon reflex/nerve stretch intact.  Sensation normal.  Cranial nerves II-XII intact.   Skin:   Normal overall no scars, lesions, ulcers or rashes. No psoriasis.  Psychiatric: Alert and oriented x 3.  Recent memory intact, remote memory unclear.  Normal mood and affect. Well groomed.  Good eye contact.  Cardiovascular: overall no swelling, no varicosities, no edema bilaterally, normal temperatures of the legs and arms, no clubbing, cyanosis and good capillary refill.  Lymphatic: palpation is normal.  Right knee with ROM 0 to 105, crepitus, effusion, limp right.  Stable.  All other  systems reviewed and are negative   The patient has been educated about the nature of the problem(s) and counseled on treatment options.  The patient appeared to understand what I have discussed and is in agreement with it.  Encounter Diagnosis  Name Primary?  . Chronic pain of right knee Yes    PLAN Call if any problems.  Precautions discussed.  Stop Naprosyn, begin Mobic.  Return to clinic 6 weeks   Electronically Signed Darreld Mclean, MD 8/31/202110:16 AM

## 2020-03-25 ENCOUNTER — Other Ambulatory Visit: Payer: Self-pay

## 2020-03-25 ENCOUNTER — Encounter: Payer: Self-pay | Admitting: Orthopaedic Surgery

## 2020-03-25 ENCOUNTER — Ambulatory Visit (INDEPENDENT_AMBULATORY_CARE_PROVIDER_SITE_OTHER): Payer: Medicare Other | Admitting: Orthopaedic Surgery

## 2020-03-25 VITALS — BP 188/91 | HR 62 | Ht <= 58 in | Wt 150.0 lb

## 2020-03-25 DIAGNOSIS — G8929 Other chronic pain: Secondary | ICD-10-CM | POA: Diagnosis not present

## 2020-03-25 DIAGNOSIS — M25561 Pain in right knee: Secondary | ICD-10-CM | POA: Diagnosis not present

## 2020-03-25 NOTE — Progress Notes (Signed)
I am much better.  She has little pain of the right knee. She is walking well. She has no new trauma.  Encounter Diagnosis  Name Primary?  . Chronic pain of right knee Yes   I will see her as needed.  Call if any problem.  Precautions discussed.   Electronically Signed Darreld Mclean, MD 10/12/202110:17 AM

## 2020-03-31 ENCOUNTER — Other Ambulatory Visit: Payer: Self-pay | Admitting: *Deleted

## 2020-03-31 MED ORDER — ATORVASTATIN CALCIUM 40 MG PO TABS: 40.0000 mg | ORAL_TABLET | Freq: Every day | ORAL | 2 refills | Status: DC

## 2020-04-02 ENCOUNTER — Other Ambulatory Visit: Payer: Self-pay | Admitting: Family Medicine

## 2020-04-02 DIAGNOSIS — E7800 Pure hypercholesterolemia, unspecified: Secondary | ICD-10-CM

## 2020-04-02 NOTE — Telephone Encounter (Signed)
Refill Atorvastatin  [id number] - [place], [place] - [address]

## 2020-04-03 MED ORDER — ATORVASTATIN CALCIUM 40 MG PO TABS: 40.0000 mg | ORAL_TABLET | Freq: Every day | ORAL | 2 refills | Status: DC

## 2020-04-21 ENCOUNTER — Other Ambulatory Visit: Payer: Self-pay | Admitting: Family Medicine

## 2020-04-29 ENCOUNTER — Other Ambulatory Visit: Payer: Self-pay | Admitting: *Deleted

## 2020-04-29 MED ORDER — ESTRADIOL 0.5 MG PO TABS: 0.5000 mg | ORAL_TABLET | Freq: Every day | ORAL | 2 refills | Status: DC

## 2020-05-14 ENCOUNTER — Other Ambulatory Visit (HOSPITAL_COMMUNITY): Payer: Self-pay | Admitting: Family Medicine

## 2020-05-14 DIAGNOSIS — Z1231 Encounter for screening mammogram for malignant neoplasm of breast: Secondary | ICD-10-CM

## 2020-05-28 ENCOUNTER — Ambulatory Visit (HOSPITAL_COMMUNITY)
Admission: RE | Admit: 2020-05-28 | Discharge: 2020-05-28 | Disposition: A | Discharge status: Home / Self Care | Payer: Medicare Other | Source: Ambulatory Visit | Attending: Family Medicine | Admitting: Family Medicine

## 2020-05-28 ENCOUNTER — Other Ambulatory Visit: Payer: Self-pay

## 2020-05-28 DIAGNOSIS — Z1231 Encounter for screening mammogram for malignant neoplasm of breast: Secondary | ICD-10-CM | POA: Diagnosis not present

## 2020-06-02 ENCOUNTER — Other Ambulatory Visit: Payer: Self-pay | Admitting: *Deleted

## 2020-06-02 MED ORDER — LISINOPRIL 40 MG PO TABS
40.0000 mg | ORAL_TABLET | Freq: Every day | ORAL | 2 refills | Status: DC
Start: 2020-06-02 — End: 2020-09-03

## 2020-06-02 MED ORDER — ESTRADIOL 0.5 MG PO TABS
0.5000 mg | ORAL_TABLET | Freq: Every day | ORAL | 2 refills | Status: DC
Start: 2020-06-02 — End: 2020-09-03

## 2020-08-08 ENCOUNTER — Other Ambulatory Visit: Payer: Self-pay | Admitting: Orthopaedic Surgery

## 2020-09-03 ENCOUNTER — Other Ambulatory Visit: Payer: Self-pay

## 2020-09-03 ENCOUNTER — Encounter: Payer: Self-pay | Admitting: Nurse Practitioner

## 2020-09-03 ENCOUNTER — Ambulatory Visit (INDEPENDENT_AMBULATORY_CARE_PROVIDER_SITE_OTHER): Payer: Medicare Other | Admitting: Nurse Practitioner

## 2020-09-03 ENCOUNTER — Ambulatory Visit (INDEPENDENT_AMBULATORY_CARE_PROVIDER_SITE_OTHER): Payer: Medicare Other

## 2020-09-03 VITALS — BP 146/84 | HR 72 | Temp 97.9°F | Ht <= 58 in | Wt 145.6 lb

## 2020-09-03 DIAGNOSIS — M549 Dorsalgia, unspecified: Secondary | ICD-10-CM

## 2020-09-03 DIAGNOSIS — M545 Low back pain, unspecified: Secondary | ICD-10-CM | POA: Diagnosis not present

## 2020-09-03 DIAGNOSIS — I10 Essential (primary) hypertension: Secondary | ICD-10-CM

## 2020-09-03 DIAGNOSIS — E782 Mixed hyperlipidemia: Secondary | ICD-10-CM

## 2020-09-03 DIAGNOSIS — M5430 Sciatica, unspecified side: Secondary | ICD-10-CM

## 2020-09-03 DIAGNOSIS — M79604 Pain in right leg: Secondary | ICD-10-CM | POA: Diagnosis not present

## 2020-09-03 MED ORDER — LISINOPRIL 40 MG PO TABS: 40.0000 mg | ORAL_TABLET | Freq: Every day | ORAL | 1 refills | Status: DC

## 2020-09-03 MED ORDER — ESTRADIOL 0.5 MG PO TABS: 0.5000 mg | ORAL_TABLET | Freq: Every day | ORAL | 1 refills | Status: DC

## 2020-09-03 MED ORDER — PREDNISONE 10 MG (21) PO TBPK: ORAL_TABLET | ORAL | 0 refills | Status: DC

## 2020-09-03 MED ORDER — METHYLPREDNISOLONE ACETATE 40 MG/ML IJ SUSP
40.0000 mg | Freq: Once | INTRAMUSCULAR | Status: AC
  Administered 2020-09-03: 40 mg via INTRAMUSCULAR

## 2020-09-03 NOTE — Progress Notes (Signed)
New Patient Note  RE: Mary Wilkinson. Mary Wilkinson MRN: [id number] DOB: [birth year] Date of Office Visit: 09/03/2020  Chief Complaint: New Patient (Initial Visit) (Est care- Dr. Avelina [place] ) and Leg Pain (Patient states that she has been having ongoing right leg pain. )  History of Present Illness:  Pain  She reports chronic Right leg pain. was not an injury that may have caused the pain. The pain started about a year ago and is staying constant. The pain does radiate right thigh and lower back. The pain is described as aching and nubness, is moderate in intensity, occurring intermittently.   Aggravating factors: walking Relieving factors: none.  She has tried NSAIDs with little relief.   Patient presents for follow up of hypertension. Patient was diagnosed in 10/07/2011. The patient is tolerating the medication well without side effects. Compliance with treatment has been good; including taking medication as directed , maintains a healthy diet and regular exercise regimen , and following up as directed. Lisinopril 40 mg, 1 tablet by mouth daily.  Mixed hyperlipidemia  Pt presents with hyperlipidemia. Patient was diagnosed in 10/07/2011 Compliance with treatment has been good; The patient is compliant with medications, maintains a low cholesterol diet , follows up as directed , and maintains an exercise regimen . The patient denies experiencing any hypercholesterolemia related symptoms. Lipitor 40 mg tablet by mouth daily   Assessment and Plan: Mary Wilkinson is a 75 y.o. female with: HTN (hypertension) Well controlled on current medication. Lisinopril 40 mg tablet by mouth daily. Labs completed- CBC, CMP  Hyperlipidemia Completed labs- Lipid panel    Right leg pain This is not new for patient, symptoms not well controlled on Mobic   Return in about 3 months (around 12/04/2020).   Diagnostics:   Past Medical History: Patient Active Problem List   Diagnosis Date Noted  . Benign head tremor  11/14/2018  . Stroke-like symptoms 06/17/2014  . Degeneration of cervical intervertebral disc 06/12/2014  . Neck pain 06/12/2014  . Insomnia 10/12/2013  . Carpal tunnel syndrome 10/26/2012  . Overweight 09/18/2012  . OA (osteoarthritis) of knee 03/09/2012  . Headache 03/09/2012  . Post menopausal syndrome 03/09/2012  . HTN (hypertension) 10/07/2011  . Hyperlipidemia 10/07/2011  . Internal hemorrhoids 10/07/2011   Past Medical History:  Diagnosis Date  . Allergy   . Arthritis    left knee  . Hyperlipidemia   . Hypertension    Past Surgical History: Past Surgical History:  Procedure Laterality Date  . ABDOMINAL HYSTERECTOMY    . KNEE SURGERY     left   Medication List:  Current Outpatient Medications  Medication Sig Dispense Refill  . atorvastatin (LIPITOR) 40 MG tablet Take 1 tablet (40 mg total) by mouth daily. 90 tablet 2  . Biotin w/ Vitamins C & E (HAIR/SKIN/NAILS PO) Take by mouth.    . Calcium Carbonate-Vit D-Min (CALCIUM 600 + MINERALS) 600-200 MG-UNIT TABS Take 1 tablet by mouth daily.    Marland Kitchen estradiol (ESTRACE) 0.5 MG tablet Take 1 tablet (0.5 mg total) by mouth daily. 90 tablet 2  . HYDROcodone-acetaminophen (NORCO/VICODIN) 5-325 MG tablet One tablet every four hours for pain. 30 tablet 0  . lisinopril (ZESTRIL) 40 MG tablet Take 1 tablet (40 mg total) by mouth daily. 90 tablet 2  . meloxicam (MOBIC) 15 MG tablet Take 1 tablet (15 mg total) by mouth daily. 30 tablet 5  . Omega-3 Fatty Acids (FISH OIL PO) Take 1 tablet by mouth daily.  No current facility-administered medications for this visit.   Allergies: No Known Allergies Social History: Social History   Socioeconomic History  . Marital status: Married    Spouse name: Not on file  . Number of children: 4  . Years of education: Not on file  . Highest education level: Not on file  Occupational History  . Occupation: Retired  Tobacco Use  . Smoking status: Never Smoker  . Smokeless tobacco: Never  Used  Substance and Sexual Activity  . Alcohol use: No    Alcohol/week: 0.0 standard drinks  . Drug use: No  . Sexual activity: Never  Other Topics Concern  . Not on file  Social History Narrative  . Not on file   Social Determinants of Health   Financial Resource Strain: Not on file  Food Insecurity: Not on file  Transportation Needs: Not on file  Physical Activity: Not on file  Stress: Not on file  Social Connections: Not on file       Family History: Family History  Problem Relation Age of Onset  . Heart disease Mother   . Colon cancer Neg Hx   . Esophageal cancer Neg Hx   . Stomach cancer Neg Hx          Review of Systems  HENT: Negative.   Eyes: Negative.   Respiratory: Negative.   Cardiovascular: Negative.   Gastrointestinal: Negative.   Genitourinary: Negative.   Musculoskeletal: Positive for back pain.       Right knee pain  Skin: Negative.   Neurological: Negative for light-headedness, numbness and headaches.  Psychiatric/Behavioral: Negative.   All other systems reviewed and are negative.  Objective:  There were no vitals taken for this visit. There is no height or weight on file to calculate BMI.    Physical Exam Vitals reviewed.  Constitutional:      Appearance: Normal appearance.  HENT:     Head: Normocephalic.     Nose: Nose normal.     Mouth/Throat:     Mouth: Mucous membranes are moist.     Pharynx: Oropharynx is clear.  Eyes:     Conjunctiva/sclera: Conjunctivae normal.  Cardiovascular:     Rate and Rhythm: Normal rate and regular rhythm.     Pulses: Normal pulses.     Heart sounds: Normal heart sounds.  Pulmonary:     Effort: Pulmonary effort is normal.     Breath sounds: Normal breath sounds.  Abdominal:     General: Bowel sounds are normal.  Musculoskeletal:        General: Tenderness present.     Comments: Right leg limited ROM  Skin:    General: Skin is warm.  Neurological:     Mental Status: She is alert and  oriented to person, place, and time.  Psychiatric:        Mood and Affect: Mood normal.        Behavior: Behavior normal.    The plan was reviewed with the patient/family, and all questions/concerned were addressed.  It was my pleasure to see Mary Wilkinson today and participate in her care. Please feel free to contact me with any questions or concerns.  Sincerely,  Lynnell Chad NP [facility]

## 2020-09-03 NOTE — Patient Instructions (Signed)
Follow up PRN foe back and knee pain, follow up 3 months for Chronic disease management Hypertension, Adult Hypertension is another name for high blood pressure. High blood pressure forces your heart to work harder to pump blood. This can cause problems over time. There are two numbers in a blood pressure reading. There is a top number (systolic) over a bottom number (diastolic). It is best to have a blood pressure that is below 120/80. Healthy choices can help lower your blood pressure, or you may need medicine to help lower it. What are the causes? The cause of this condition is not known. Some conditions may be related to high blood pressure. What increases the risk?  Smoking.  Having type 2 diabetes mellitus, high cholesterol, or both.  Not getting enough exercise or physical activity.  Being overweight.  Having too much fat, sugar, calories, or salt (sodium) in your diet.  Drinking too much alcohol.  Having long-term (chronic) kidney disease.  Having a family history of high blood pressure.  Age. Risk increases with age.  Race. You may be at higher risk if you are African American.  Gender. Men are at higher risk than women before age 23. After age 3, women are at higher risk than men.  Having obstructive sleep apnea.  Stress. What are the signs or symptoms?  High blood pressure may not cause symptoms. Very high blood pressure (hypertensive crisis) may cause: ? Headache. ? Feelings of worry or nervousness (anxiety). ? Shortness of breath. ? Nosebleed. ? A feeling of being sick to your stomach (nausea). ? Throwing up (vomiting). ? Changes in how you see. ? Very bad chest pain. ? Seizures. How is this treated?  This condition is treated by making healthy lifestyle changes, such as: ? Eating healthy foods. ? Exercising more. ? Drinking less alcohol.  Your health care provider may prescribe medicine if lifestyle changes are not enough to get your blood pressure  under control, and if: ? Your top number is above 130. ? Your bottom number is above 80.  Your personal target blood pressure may vary. Follow these instructions at home: Eating and drinking  If told, follow the DASH eating plan. To follow this plan: ? Fill one half of your plate at each meal with fruits and vegetables. ? Fill one fourth of your plate at each meal with whole grains. Whole grains include whole-wheat pasta, brown rice, and whole-grain bread. ? Eat or drink low-fat dairy products, such as skim milk or low-fat yogurt. ? Fill one fourth of your plate at each meal with low-fat (lean) proteins. Low-fat proteins include fish, chicken without skin, eggs, beans, and tofu. ? Avoid fatty meat, cured and processed meat, or chicken with skin. ? Avoid pre-made or processed food.  Eat less than 1,500 mg of salt each day.  Do not drink alcohol if: ? Your doctor tells you not to drink. ? You are pregnant, may be pregnant, or are planning to become pregnant.  If you drink alcohol: ? Limit how much you use to:  0-1 drink a day for women.  0-2 drinks a day for men. ? Be aware of how much alcohol is in your drink. In the [place] one drink equals one 12 oz bottle of beer (355 mL), one 5 oz glass of wine (148 mL), or one 1 oz glass of hard liquor (44 mL).   Lifestyle  Work with your doctor to stay at a healthy weight or to lose weight. Ask your doctor  what the best weight is for you.  Get at least 30 minutes of exercise most days of the week. This may include walking, swimming, or biking.  Get at least 30 minutes of exercise that strengthens your muscles (resistance exercise) at least 3 days a week. This may include lifting weights or doing Pilates.  Do not use any products that contain nicotine or tobacco, such as cigarettes, e-cigarettes, and chewing tobacco. If you need help quitting, ask your doctor.  Check your blood pressure at home as told by your doctor.  Keep all follow-up  visits as told by your doctor. This is important.   Medicines  Take over-the-counter and prescription medicines only as told by your doctor. Follow directions carefully.  Do not skip doses of blood pressure medicine. The medicine does not work as well if you skip doses. Skipping doses also puts you at risk for problems.  Ask your doctor about side effects or reactions to medicines that you should watch for. Contact a doctor if you:  Think you are having a reaction to the medicine you are taking.  Have headaches that keep coming back (recurring).  Feel dizzy.  Have swelling in your ankles.  Have trouble with your vision. Get help right away if you:  Get a very bad headache.  Start to feel mixed up (confused).  Feel weak or numb.  Feel faint.  Have very bad pain in your: ? Chest. ? Belly (abdomen).  Throw up more than once.  Have trouble breathing. Summary  Hypertension is another name for high blood pressure.  High blood pressure forces your heart to work harder to pump blood.  For most people, a normal blood pressure is less than 120/80.  Making healthy choices can help lower blood pressure. If your blood pressure does not get lower with healthy choices, you may need to take medicine. This information is not intended to replace advice given to you by your health care provider. Make sure you discuss any questions you have with your health care provider. Document Revised: 02/08/2018 Document Reviewed: 02/08/2018 Elsevier Patient Education  2021 [employer]. Leg Cramps Leg cramps occur when one or more muscles tighten and a person has no control over it (involuntary muscle contraction). Muscle cramps are most common in the calf muscles of the leg. They can occur during exercise or at rest. Leg cramps are painful, and they may last for a few seconds to a few minutes. Cramps may return several times before they finally stop. Usually, leg cramps are not caused by a  serious medical problem. In many cases, the cause is not known. Some common causes include:  Excessive physical effort (overexertion), such as during intense exercise.  Doing the same motion over and over.  Staying in a certain position for a long period of time.  Improper preparation, form, or technique while doing a sport or an activity.  Dehydration.  Injury.  Side effects of certain medicines.  Abnormally low levels of minerals in your blood (electrolytes), especially potassium and calcium. This could result from: ? Pregnancy. ? Taking diuretic medicines. Follow these instructions at home: Eating and drinking  Drink enough fluid to keep your urine pale yellow. Staying hydrated may help prevent cramps.  Eat a healthy diet that includes plenty of nutrients to help your muscles function. A healthy diet includes fruits and vegetables, lean protein, whole grains, and low-fat or nonfat dairy products. Managing pain, stiffness, and swelling  Try massaging, stretching, and relaxing the affected  muscle. Do this for several minutes at a time.  If directed, put ice on areas that are sore or painful after a cramp. To do this: ? Put ice in a plastic bag. ? Place a towel between your skin and the bag. ? Leave the ice on for 20 minutes, 2-3 times a day. ? Remove the ice if your skin turns bright red. This is very important. If you cannot feel pain, heat, or cold, you have a greater risk of damage to the area.  If directed, apply heat to muscles that are tense or tight. Do this before you exercise, or as often as told by your health care provider. Use the heat source that your health care provider recommends, such as a moist heat pack or a heating pad. To do this: ? Place a towel between your skin and the heat source. ? Leave the heat on for 20-30 minutes. ? Remove the heat if your skin turns bright red. This is especially important if you are unable to feel pain, heat, or cold. You may  have a greater risk of getting burned.  Try taking hot showers or baths to help relax tight muscles.      General instructions  If you are having frequent leg cramps, avoid intense exercise for several days.  Take over-the-counter and prescription medicines only as told by your health care provider.  Keep all follow-up visits. This is important. Contact a health care provider if:  Your leg cramps get more severe or more frequent, or they do not improve over time.  Your foot becomes cold, numb, or blue. Summary  Muscle cramps can develop in any muscle, but the most common place is in the calf muscles of the leg.  Leg cramps are painful, and they may last for a few seconds to a few minutes.  Usually, leg cramps are not caused by a serious medical problem. Often, the cause is not known.  Stay hydrated, and take over-the-counter and prescription medicines only as told by your health care provider. This information is not intended to replace advice given to you by your health care provider. Make sure you discuss any questions you have with your health care provider. Document Revised: 10/17/2019 Document Reviewed: 10/17/2019 Elsevier Patient Education  2021 Elsevier Inc. Acute Back Pain, Adult Acute back pain is sudden and usually short-lived. It is often caused by an injury to the muscles and tissues in the back. The injury may result from:  A muscle or ligament getting overstretched or torn (strained). Ligaments are tissues that connect bones to each other. Lifting something improperly can cause a back strain.  Wear and tear (degeneration) of the spinal disks. Spinal disks are circular tissue that provide cushioning between the bones of the spine (vertebrae).  Twisting motions, such as while playing sports or doing yard work.  A hit to the back.  Arthritis. You may have a physical exam, lab tests, and imaging tests to find the cause of your pain. Acute back pain usually goes  away with rest and home care. Follow these instructions at home: Managing pain, stiffness, and swelling  Treatment may include medicines for pain and inflammation that are taken by mouth or applied to the skin, prescription pain medicine, or muscle relaxants. Take over-the-counter and prescription medicines only as told by your health care provider.  Your health care provider may recommend applying ice during the first 24-48 hours after your pain starts. To do this: ? Put ice in a  plastic bag. ? Place a towel between your skin and the bag. ? Leave the ice on for 20 minutes, 2-3 times a day.  If directed, apply heat to the affected area as often as told by your health care provider. Use the heat source that your health care provider recommends, such as a moist heat pack or a heating pad. ? Place a towel between your skin and the heat source. ? Leave the heat on for 20-30 minutes. ? Remove the heat if your skin turns bright red. This is especially important if you are unable to feel pain, heat, or cold. You have a greater risk of getting burned. Activity  Do not stay in bed. Staying in bed for more than 1-2 days can delay your recovery.  Sit up and stand up straight. Avoid leaning forward when you sit or hunching over when you stand. ? If you work at a desk, sit close to it so you do not need to lean over. Keep your chin tucked in. Keep your neck drawn back, and keep your elbows bent at a 90-degree angle (right angle). ? Sit high and close to the steering wheel when you drive. Add lower back (lumbar) support to your car seat, if needed.  Take short walks on even surfaces as soon as you are able. Try to increase the length of time you walk each day.  Do not sit, drive, or stand in one place for more than 30 minutes at a time. Sitting or standing for long periods of time can put stress on your back.  Do not drive or use heavy machinery while taking prescription pain medicine.  Use proper  lifting techniques. When you bend and lift, use positions that put less stress on your back: ? Deepstep your knees. ? Keep the load close to your body. ? Avoid twisting.  Exercise regularly as told by your health care provider. Exercising helps your back heal faster and helps prevent back injuries by keeping muscles strong and flexible.  Work with a physical therapist to make a safe exercise program, as recommended by your health care provider. Do any exercises as told by your physical therapist.   Lifestyle  Maintain a healthy weight. Extra weight puts stress on your back and makes it difficult to have good posture.  Avoid activities or situations that make you feel anxious or stressed. Stress and anxiety increase muscle tension and can make back pain worse. Learn ways to manage anxiety and stress, such as through exercise. General instructions  Sleep on a firm mattress in a comfortable position. Try lying on your side with your knees slightly bent. If you lie on your back, put a pillow under your knees.  Follow your treatment plan as told by your health care provider. This may include: ? Cognitive or behavioral therapy. ? Acupuncture or massage therapy. ? Meditation or yoga. Contact a health care provider if:  You have pain that is not relieved with rest or medicine.  You have increasing pain going down into your legs or buttocks.  Your pain does not improve after 2 weeks.  You have pain at night.  You lose weight without trying.  You have a fever or chills. Get help right away if:  You develop new bowel or bladder control problems.  You have unusual weakness or numbness in your arms or legs.  You develop nausea or vomiting.  You develop abdominal pain.  You feel faint. Summary  Acute back pain is sudden  and usually short-lived.  Use proper lifting techniques. When you bend and lift, use positions that put less stress on your back.  Take over-the-counter and  prescription medicines and apply heat or ice as directed by your health care provider. This information is not intended to replace advice given to you by your health care provider. Make sure you discuss any questions you have with your health care provider. Document Revised: 02/22/2020 Document Reviewed: 02/22/2020 Elsevier Patient Education  2021 [employer].

## 2020-09-03 NOTE — Assessment & Plan Note (Signed)
This is not new for patient, symptoms not well controlled on Mobic

## 2020-09-03 NOTE — Addendum Note (Signed)
Addended by: Daryll Drown on: 09/03/2020 03:22 PM   Modules accepted: Level of Service

## 2020-09-03 NOTE — Assessment & Plan Note (Addendum)
Well controlled on current medication. Lisinopril 40 mg tablet by mouth daily. Labs completed- CBC, CMP

## 2020-09-03 NOTE — Assessment & Plan Note (Signed)
Completed labs- Lipid panel

## 2020-09-04 ENCOUNTER — Telehealth: Payer: Self-pay

## 2020-09-04 DIAGNOSIS — M5430 Sciatica, unspecified side: Secondary | ICD-10-CM

## 2020-09-04 LAB — LIPID PANEL
Chol/HDL Ratio: 2.6 ratio (ref 0.0–4.4)
Cholesterol, Total: 196 mg/dL (ref 100–199)
HDL: 74 mg/dL (ref >39)
LDL Chol Calc (NIH): 109 mg/dL — ABNORMAL HIGH (ref 0–99)
Triglycerides: 73 mg/dL (ref 0–149)
VLDL Cholesterol Cal: 13 mg/dL (ref 5–40)

## 2020-09-04 LAB — COMPREHENSIVE METABOLIC PANEL
ALT: 24 IU/L (ref 0–32)
AST: 26 IU/L (ref 0–40)
Albumin/Globulin Ratio: 2 (ref 1.2–2.2)
Albumin: 4.5 g/dL (ref 3.7–4.7)
Alkaline Phosphatase: 60 IU/L (ref 44–121)
BUN/Creatinine Ratio: 16 (ref 12–28)
BUN: 14 mg/dL (ref 8–27)
Bilirubin Total: 0.8 mg/dL (ref 0.0–1.2)
CO2: 24 mmol/L (ref 20–29)
Calcium: 9.7 mg/dL (ref 8.7–10.3)
Chloride: 104 mmol/L (ref 96–106)
Creatinine, Ser: 0.87 mg/dL (ref 0.57–1.00)
Globulin, Total: 2.3 g/dL (ref 1.5–4.5)
Glucose: 69 mg/dL (ref 65–99)
Potassium: 4.2 mmol/L (ref 3.5–5.2)
Sodium: 142 mmol/L (ref 134–144)
Total Protein: 6.8 g/dL (ref 6.0–8.5)
eGFR: 71 mL/min/{1.73_m2} (ref >59)

## 2020-09-04 LAB — CBC WITH DIFFERENTIAL/PLATELET
Basophils Absolute: 0 10*3/uL (ref 0.0–0.2)
Basos: 0 %
EOS (ABSOLUTE): 0.1 10*3/uL (ref 0.0–0.4)
Eos: 2 %
Hematocrit: 39.4 % (ref 34.0–46.6)
Hemoglobin: 12.3 g/dL (ref 11.1–15.9)
Immature Grans (Abs): 0 10*3/uL (ref 0.0–0.1)
Immature Granulocytes: 0 %
Lymphocytes Absolute: 1.8 10*3/uL (ref 0.7–3.1)
Lymphs: 34 %
MCH: 27 pg (ref 26.6–33.0)
MCHC: 31.2 g/dL — ABNORMAL LOW (ref 31.5–35.7)
MCV: 87 fL (ref 79–97)
Monocytes Absolute: 0.5 10*3/uL (ref 0.1–0.9)
Monocytes: 9 %
Neutrophils Absolute: 3 10*3/uL (ref 1.4–7.0)
Neutrophils: 55 %
Platelets: 239 10*3/uL (ref 150–450)
RBC: 4.55 x10E6/uL (ref 3.77–5.28)
RDW: 13.5 % (ref 11.7–15.4)
WBC: 5.4 10*3/uL (ref 3.4–10.8)

## 2020-09-04 NOTE — Telephone Encounter (Signed)
Please send predniSONE (STERAPRED UNI-PAK 21 TAB) 10 MG (21) TBPK tablet to [facility]. Cancel at mail order

## 2020-09-05 ENCOUNTER — Other Ambulatory Visit: Payer: Self-pay | Admitting: *Deleted

## 2020-09-05 DIAGNOSIS — E7800 Pure hypercholesterolemia, unspecified: Secondary | ICD-10-CM

## 2020-09-05 MED ORDER — PREDNISONE 10 MG (21) PO TBPK: ORAL_TABLET | ORAL | 0 refills | Status: DC

## 2020-09-07 MED ORDER — ATORVASTATIN CALCIUM 40 MG PO TABS: 40.0000 mg | ORAL_TABLET | Freq: Every day | ORAL | 2 refills | Status: DC

## 2020-12-02 DIAGNOSIS — H5213 Myopia, bilateral: Secondary | ICD-10-CM | POA: Diagnosis not present

## 2020-12-08 ENCOUNTER — Encounter: Payer: Self-pay | Admitting: Nurse Practitioner

## 2020-12-08 ENCOUNTER — Other Ambulatory Visit: Payer: Self-pay

## 2020-12-08 ENCOUNTER — Ambulatory Visit (INDEPENDENT_AMBULATORY_CARE_PROVIDER_SITE_OTHER): Payer: Medicare Other | Admitting: Nurse Practitioner

## 2020-12-08 VITALS — BP 138/71 | HR 62 | Temp 97.3°F | Ht <= 58 in | Wt 147.0 lb

## 2020-12-08 DIAGNOSIS — I10 Essential (primary) hypertension: Secondary | ICD-10-CM | POA: Diagnosis not present

## 2020-12-08 DIAGNOSIS — Z0000 Encounter for general adult medical examination without abnormal findings: Secondary | ICD-10-CM | POA: Diagnosis not present

## 2020-12-08 DIAGNOSIS — Z0001 Encounter for general adult medical examination with abnormal findings: Secondary | ICD-10-CM | POA: Diagnosis not present

## 2020-12-08 DIAGNOSIS — E782 Mixed hyperlipidemia: Secondary | ICD-10-CM

## 2020-12-08 NOTE — Patient Instructions (Signed)
Health Maintenance After Age 73 After age 73, you are at a higher risk for certain long-term diseases and infections as well as injuries from falls. Falls are a major cause of broken bones and head injuries in people who are older than age 73. Getting regular preventive care can help to keep you healthy and well. Preventive care includes getting regular testing and making lifestyle changes as recommended by your health care provider. Talk with your health care provider about: Which screenings and tests you should have. A screening is a test that checks for a disease when you have no symptoms. A diet and exercise plan that is right for you. What should I know about screenings and tests to prevent falls? Screening and testing are the best ways to find a health problem early. Early diagnosis and treatment give you the best chance of managing medical conditions that are common after age 73. Certain conditions and lifestyle choices may make you more likely to have a fall. Your health care provider may recommend: Regular vision checks. Poor vision and conditions such as cataracts can make you more likely to have a fall. If you wear glasses, make sure to get your prescription updated if your vision changes. Medicine review. Work with your health care provider to regularly review all of the medicines you are taking, including over-the-counter medicines. Ask your health care provider about any side effects that may make you more likely to have a fall. Tell your health care provider if any medicines that you take make you feel dizzy or sleepy. Osteoporosis screening. Osteoporosis is a condition that causes the bones to get weaker. This can make the bones weak and cause them to break more easily. Blood pressure screening. Blood pressure changes and medicines to control blood pressure can make you feel dizzy. Strength and balance checks. Your health care provider may recommend certain tests to check your strength and  balance while standing, walking, or changing positions. Foot health exam. Foot pain and numbness, as well as not wearing proper footwear, can make you more likely to have a fall. Depression screening. You may be more likely to have a fall if you have a fear of falling, feel emotionally low, or feel unable to do activities that you used to do. Alcohol use screening. Using too much alcohol can affect your balance and may make you more likely to have a fall. What actions can I take to lower my risk of falls? General instructions Talk with your health care provider about your risks for falling. Tell your health care provider if: You fall. Be sure to tell your health care provider about all falls, even ones that seem minor. You feel dizzy, sleepy, or off-balance. Take over-the-counter and prescription medicines only as told by your health care provider. These include any supplements. Eat a healthy diet and maintain a healthy weight. A healthy diet includes low-fat dairy products, low-fat (lean) meats, and fiber from whole grains, beans, and lots of fruits and vegetables. Home safety Remove any tripping hazards, such as rugs, cords, and clutter. Install safety equipment such as grab bars in bathrooms and safety rails on stairs. Keep rooms and walkways well-lit. Activity  Follow a regular exercise program to stay fit. This will help you maintain your balance. Ask your health care provider what types of exercise are appropriate for you. If you need a cane or walker, use it as recommended by your health care provider. Wear supportive shoes that have nonskid soles.  Lifestyle Do   not drink alcohol if your health care provider tells you not to drink. If you drink alcohol, limit how much you have: 0-1 drink a day for women. 0-2 drinks a day for men. Be aware of how much alcohol is in your drink. In the [place] one drink equals one typical bottle of beer (12 oz), one-half glass of wine (5 oz), or one shot  of hard liquor (1 oz). Do not use any products that contain nicotine or tobacco, such as cigarettes and e-cigarettes. If you need help quitting, ask your health care provider. Summary Having a healthy lifestyle and getting preventive care can help to protect your health and wellness after age 53. Screening and testing are the best way to find a health problem early and help you avoid having a fall. Early diagnosis and treatment give you the best chance for managing medical conditions that are more common for people who are older than age 59. Falls are a major cause of broken bones and head injuries in people who are older than age 73. Take precautions to prevent a fall at home. Work with your health care provider to learn what changes you can make to improve your health and wellness and to prevent falls. This information is not intended to replace advice given to you by your health care provider. Make sure you discuss any questions you have with your healthcare provider. Document Revised: 05/16/2020 Document Reviewed: 05/16/2020 Elsevier Patient Education  2022 [employer] High Cholesterol  High cholesterol is a condition in which the blood has high levels of a white, waxy substance similar to fat (cholesterol). The liver makes all the cholesterol that the body needs. The human body needs small amounts of cholesterol to help build cells. A person gets extra orexcess cholesterol from the food that he or she eats. The blood carries cholesterol from the liver to the rest of the body. If you have high cholesterol, deposits (plaques) may build up on the walls of your arteries. Arteries are the blood vessels that carry blood away from your heart. These plaques make the arteries narrowand stiff. Cholesterol plaques increase your risk for heart attack and stroke. Work withyour health care provider to keep your cholesterol levels in a healthy range. What increases the risk? The following factors may make  you more likely to develop this condition: Eating foods that are high in animal fat (saturated fat) or cholesterol. Being overweight. Not getting enough exercise. A family history of high cholesterol (familial hypercholesterolemia). Use of tobacco products. Having diabetes. What are the signs or symptoms? There are no symptoms of this condition. How is this diagnosed? This condition may be diagnosed based on the results of a blood test. If you are older than 73 years of age, your health care provider may check your cholesterol levels every 4-6 years. You may be checked more often if you have high cholesterol or other risk factors for heart disease. The blood test for cholesterol measures: "Bad" cholesterol, or LDL cholesterol. This is the main type of cholesterol that causes heart disease. The desired level is less than 100 mg/dL. "Good" cholesterol, or HDL cholesterol. HDL helps protect against heart disease by cleaning the arteries and carrying the LDL to the liver for processing. The desired level for HDL is 60 mg/dL or higher. Triglycerides. These are fats that your body can store or burn for energy. The desired level is less than 150 mg/dL. Total cholesterol. This measures the total amount of cholesterol in your blood  and includes LDL, HDL, and triglycerides. The desired level is less than 200 mg/dL. How is this treated? This condition may be treated with: Diet changes. You may be asked to eat foods that have more fiber and less saturated fats or added sugar. Lifestyle changes. These may include regular exercise, maintaining a healthy weight, and quitting use of tobacco products. Medicines. These are given when diet and lifestyle changes have not worked. You may be prescribed a statin medicine to help lower your cholesterol levels. Follow these instructions at home: Eating and drinking  Eat a healthy, balanced diet. This diet includes: Daily servings of a variety of fresh, frozen, or  canned fruits and vegetables. Daily servings of whole grain foods that are rich in fiber. Foods that are low in saturated fats and trans fats. These include poultry and fish without skin, lean cuts of meat, and low-fat dairy products. A variety of fish, especially oily fish that contain omega-3 fatty acids. Aim to eat fish at least 2 times a week. Avoid foods and drinks that have added sugar. Use healthy cooking methods, such as roasting, grilling, broiling, baking, poaching, steaming, and stir-frying. Do not fry your food except for stir-frying.  Lifestyle  Get regular exercise. Aim to exercise for a total of 150 minutes a week. Increase your activity level by doing activities such as gardening, walking, and taking the stairs. Do not use any products that contain nicotine or tobacco, such as cigarettes, e-cigarettes, and chewing tobacco. If you need help quitting, ask your health care provider.  General instructions Take over-the-counter and prescription medicines only as told by your health care provider. Keep all follow-up visits as told by your health care provider. This is important. Where to find more information American Heart Association: www.heart.org National Heart, Lung, and Blood Institute: [employer] Contact a health care provider if: You have trouble achieving or maintaining a healthy diet or weight. You are starting an exercise program. You are unable to stop smoking. Get help right away if: You have chest pain. You have trouble breathing. You have any symptoms of a stroke. "BE FAST" is an easy way to remember the main warning signs of a stroke: B - Balance. Signs are dizziness, sudden trouble walking, or loss of balance. E - Eyes. Signs are trouble seeing or a sudden change in vision. F - Face. Signs are sudden weakness or numbness of the face, or the face or eyelid drooping on one side. A - Arms. Signs are weakness or numbness in an arm. This happens suddenly and  usually on one side of the body. S - Speech. Signs are sudden trouble speaking, slurred speech, or trouble understanding what people say. T - Time. Time to call emergency services. Write down what time symptoms started. You have other signs of a stroke, such as: A sudden, severe headache with no known cause. Nausea or vomiting. Seizure. These symptoms may represent a serious problem that is an emergency. Do not wait to see if the symptoms will go away. Get medical help right away. Call your local emergency services (911 in the [place] Do not drive yourself to the hospital. Summary Cholesterol plaques increase your risk for heart attack and stroke. Work with your health care provider to keep your cholesterol levels in a healthy range. Eat a healthy, balanced diet, get regular exercise, and maintain a healthy weight. Do not use any products that contain nicotine or tobacco, such as cigarettes, e-cigarettes, and chewing tobacco. Get help right away if you  have any symptoms of a stroke. This information is not intended to replace advice given to you by your health care provider. Make sure you discuss any questions you have with your healthcare provider. Document Revised: 04/30/2019 Document Reviewed: 04/30/2019 Elsevier Patient Education  2022 [employer] Hypertension, Adult Hypertension is another name for high blood pressure. High blood pressure forces your heart to work harder to pump blood. This can cause problems overtime. There are two numbers in a blood pressure reading. There is a top number (systolic) over a bottom number (diastolic). It is best to have a blood pressure that is below 120/80. Healthy choicescan help lower your blood pressure, or you may need medicine to help lower it. What are the causes? The cause of this condition is not known. Some conditions may be related tohigh blood pressure. What increases the risk? Smoking. Having type 2 diabetes mellitus, high cholesterol, or  both. Not getting enough exercise or physical activity. Being overweight. Having too much fat, sugar, calories, or salt (sodium) in your diet. Drinking too much alcohol. Having long-term (chronic) kidney disease. Having a family history of high blood pressure. Age. Risk increases with age. Race. You may be at higher risk if you are African American. Gender. Men are at higher risk than women before age 73. After age 73, women are at higher risk than men. Having obstructive sleep apnea. Stress. What are the signs or symptoms? High blood pressure may not cause symptoms. Very high blood pressure (hypertensive crisis) may cause: Headache. Feelings of worry or nervousness (anxiety). Shortness of breath. Nosebleed. A feeling of being sick to your stomach (nausea). Throwing up (vomiting). Changes in how you see. Very bad chest pain. Seizures. How is this treated? This condition is treated by making healthy lifestyle changes, such as: Eating healthy foods. Exercising more. Drinking less alcohol. Your health care provider may prescribe medicine if lifestyle changes are not enough to get your blood pressure under control, and if: Your top number is above 130. Your bottom number is above 80. Your personal target blood pressure may vary. Follow these instructions at home: Eating and drinking  If told, follow the DASH eating plan. To follow this plan: Fill one half of your plate at each meal with fruits and vegetables. Fill one fourth of your plate at each meal with whole grains. Whole grains include whole-wheat pasta, brown rice, and whole-grain bread. Eat or drink low-fat dairy products, such as skim milk or low-fat yogurt. Fill one fourth of your plate at each meal with low-fat (lean) proteins. Low-fat proteins include fish, chicken without skin, eggs, beans, and tofu. Avoid fatty meat, cured and processed meat, or chicken with skin. Avoid pre-made or processed food. Eat less than  1,500 mg of salt each day. Do not drink alcohol if: Your doctor tells you not to drink. You are pregnant, may be pregnant, or are planning to become pregnant. If you drink alcohol: Limit how much you use to: 0-1 drink a day for women. 0-2 drinks a day for men. Be aware of how much alcohol is in your drink. In the [place] one drink equals one 12 oz bottle of beer (355 mL), one 5 oz glass of wine (148 mL), or one 1 oz glass of hard liquor (44 mL).  Lifestyle  Work with your doctor to stay at a healthy weight or to lose weight. Ask your doctor what the best weight is for you. Get at least 30 minutes of exercise most days of the  week. This may include walking, swimming, or biking. Get at least 30 minutes of exercise that strengthens your muscles (resistance exercise) at least 3 days a week. This may include lifting weights or doing Pilates. Do not use any products that contain nicotine or tobacco, such as cigarettes, e-cigarettes, and chewing tobacco. If you need help quitting, ask your doctor. Check your blood pressure at home as told by your doctor. Keep all follow-up visits as told by your doctor. This is important.  Medicines Take over-the-counter and prescription medicines only as told by your doctor. Follow directions carefully. Do not skip doses of blood pressure medicine. The medicine does not work as well if you skip doses. Skipping doses also puts you at risk for problems. Ask your doctor about side effects or reactions to medicines that you should watch for. Contact a doctor if you: Think you are having a reaction to the medicine you are taking. Have headaches that keep coming back (recurring). Feel dizzy. Have swelling in your ankles. Have trouble with your vision. Get help right away if you: Get a very bad headache. Start to feel mixed up (confused). Feel weak or numb. Feel faint. Have very bad pain in your: Chest. Belly (abdomen). Throw up more than once. Have trouble  breathing. Summary Hypertension is another name for high blood pressure. High blood pressure forces your heart to work harder to pump blood. For most people, a normal blood pressure is less than 120/80. Making healthy choices can help lower blood pressure. If your blood pressure does not get lower with healthy choices, you may need to take medicine. This information is not intended to replace advice given to you by your health care provider. Make sure you discuss any questions you have with your healthcare provider. Document Revised: 02/08/2018 Document Reviewed: 02/08/2018 Elsevier Patient Education  2022 [employer].

## 2020-12-08 NOTE — Assessment & Plan Note (Signed)
Completed head to toe assessment, education provided on health maintenance and preventative care. Patient declined COVID-19 vaccine today. No new concerns. Printed handout given to patient .

## 2020-12-08 NOTE — Assessment & Plan Note (Signed)
HTN, well controlled with current medication dose. Zestril 40 mg daily by mouth.  Continue low sodium diet and exeresis. Education provided to patient with printed hand out given.  Follow up in 3 months, labs completed: Lipid panel, CBC, CMP.

## 2020-12-08 NOTE — Assessment & Plan Note (Signed)
Patients symptoms well managed on Lipitor 40 mg tablet by mouth daily. completed labs lipid-panel.   Continue low cholesterol diet. Follow up in 3 months.

## 2020-12-08 NOTE — Addendum Note (Signed)
Addended by: Daryll Drown on: 12/08/2020 12:13 PM   Modules accepted: Level of Service

## 2020-12-08 NOTE — Progress Notes (Signed)
Established Patient Office Visit  Subjective:  Patient ID: Mary Wilkinson, female    DOB: Apr 14, 1946  Age: 75 y.o. MRN: [id number]  CC:  Chief Complaint  Patient presents with   Annual Exam    HPI Mary Wilkinson. Pair presents for .   Encounter for general adult medical examination   Physical: Patient's last physical exam was 1 year ago .  Weight: Appropriate for height (BMI less than 27%) ;  Blood Pressure: Normal (BP greater than 120/80) ;  Medical History: Patient history reviewed ; Family history reviewed ;  Allergies Reviewed: No change in current allergies ;  Medications Reviewed: Medications reviewed - no changes ;  Lipids: Normal lipid levels ; labs completed results pending Smoking: Life-long non-smoker ;  Physical Activity: Exercises at least 3 times per week ; as tolerated Alcohol/Drug Use: Is a non-drinker ; No illicit drug use ;  Patient is not afflicted from Stress Incontinence and Urge Incontinence  Safety: reviewed ; Patient wears a seat belt, has smoke detectors, has carbon monoxide detectors, practices appropriate gun safety, and wears sunscreen with extended sun exposure. Dental Care: biannual cleanings, patient does not go to the dentis, she wears a upper partials brushes and flosses daily. Ophthalmology/Optometry: Annual visit.  Hearing loss: none Vision impairments: wear prescription glasses.   Mixed hyperlipidemia  Pt presents with hyperlipidemia. Patient was diagnosed in. Compliance with treatment has been good The patient is compliant with medications, maintains a low cholesterol diet , follows up as directed, and maintains an exercise regimen . The patient denies experiencing any hypercholesterolemia related symptoms.  Lipitor 40 mg tablet by mouth daily.    Pt presents for follow up of hypertension. Patient was diagnosed in 10/07/2011. The patient is tolerating the medication well without side effects. Compliance with treatment has been good; including taking  medication as directed , maintains a healthy diet and regular exercise regimen , and following up as directed. Zestril 40 mg tablet by mouth daily  Past Medical History:  Diagnosis Date   Allergy    Arthritis    left knee   Hyperlipidemia    Hypertension     Past Surgical History:  Procedure Laterality Date   ABDOMINAL HYSTERECTOMY     KNEE SURGERY     left    Family History  Problem Relation Age of Onset   Heart disease Mother    Kidney disease Father    Colon cancer Neg Hx    Esophageal cancer Neg Hx    Stomach cancer Neg Hx     Social History   Socioeconomic History   Marital status: Married    Spouse name: Not on file   Number of children: 4   Years of education: Not on file   Highest education level: Not on file  Occupational History   Occupation: Retired  Tobacco Use   Smoking status: Never   Smokeless tobacco: Never  Vaping Use   Vaping Use: Never used  Substance and Sexual Activity   Alcohol use: No    Alcohol/week: 0.0 standard drinks   Drug use: No   Sexual activity: Not Currently    Birth control/protection: None  Other Topics Concern   Not on file  Social History Narrative   Not on file   Social Determinants of Health   Financial Resource Strain: Not on file  Food Insecurity: Not on file  Transportation Needs: Not on file  Physical Activity: Not on file  Stress: Not on file  Social  Connections: Not on file  Intimate Partner Violence: Not on file    Outpatient Medications Prior to Visit  Medication Sig Dispense Refill   atorvastatin (LIPITOR) 40 MG tablet Take 1 tablet (40 mg total) by mouth daily. 90 tablet 2   estradiol (ESTRACE) 0.5 MG tablet Take 1 tablet (0.5 mg total) by mouth daily. 90 tablet 1   lisinopril (ZESTRIL) 40 MG tablet Take 1 tablet (40 mg total) by mouth daily. 90 tablet 1   JANSSEN COVID-19 VACCINE 0.5 ML injection      meloxicam (MOBIC) 15 MG tablet Take 1 tablet (15 mg total) by mouth daily. 30 tablet 5    predniSONE (STERAPRED UNI-PAK 21 TAB) 10 MG (21) TBPK tablet 6 tablet day 1, 5 tablet day 2, 4 tablet day 3, 3 tablet day 4, 2 tablet day 5, 1 tablet day 6 1 each 0   No facility-administered medications prior to visit.    No Known Allergies  ROS Review of Systems  Constitutional: Negative.   HENT: Negative.    Eyes: Negative.   Respiratory: Negative.    Gastrointestinal: Negative.   Musculoskeletal: Negative.   Skin:  Negative for rash.  Neurological: Negative.   Psychiatric/Behavioral:  The patient is not nervous/anxious.   All other systems reviewed and are negative.    Objective:    Physical Exam Vitals and nursing note reviewed.  Constitutional:      Appearance: Normal appearance.  HENT:     Head: Normocephalic.     Nose: Nose normal. No congestion.     Mouth/Throat:     Mouth: Mucous membranes are moist.     Pharynx: Oropharynx is clear.  Eyes:     Conjunctiva/sclera: Conjunctivae normal.     Pupils: Pupils are equal, round, and reactive to light.  Cardiovascular:     Rate and Rhythm: Normal rate and regular rhythm.  Pulmonary:     Effort: Pulmonary effort is normal.     Breath sounds: Normal breath sounds.  Abdominal:     General: Bowel sounds are normal.  Musculoskeletal:        General: Normal range of motion.  Skin:    Findings: No rash.  Neurological:     Mental Status: She is alert and oriented to person, place, and time.  Psychiatric:        Mood and Affect: Mood normal.        Behavior: Behavior normal.    BP 138/71   Pulse 62   Temp (!) 97.3 F (36.3 C) (Temporal)   Ht 4' 10" (1.473 m)   Wt 147 lb (66.7 kg)   SpO2 97%   BMI 30.72 kg/m  Wt Readings from Last 3 Encounters:  12/08/20 147 lb (66.7 kg)  09/03/20 145 lb 9.6 oz (66 kg)  03/25/20 150 lb (68 kg)     Health Maintenance Due  Topic Date Due   COVID-19 Vaccine (2 - Booster for Janssen series) 08/13/2020    There are no preventive care reminders to display for this  patient.  No results found for: TSH Lab Results  Component Value Date   WBC 5.4 09/03/2020   HGB 12.3 09/03/2020   HCT 39.4 09/03/2020   MCV 87 09/03/2020   PLT 239 09/03/2020   Lab Results  Component Value Date   NA 142 09/03/2020   K 4.2 09/03/2020   CO2 24 09/03/2020   GLUCOSE 69 09/03/2020   BUN 14 09/03/2020   CREATININE 0.87 09/03/2020   BILITOT  0.8 09/03/2020   ALKPHOS 60 09/03/2020   AST 26 09/03/2020   ALT 24 09/03/2020   PROT 6.8 09/03/2020   ALBUMIN 4.5 09/03/2020   CALCIUM 9.7 09/03/2020   EGFR 71 09/03/2020   Lab Results  Component Value Date   CHOL 196 09/03/2020   Lab Results  Component Value Date   HDL 74 09/03/2020   Lab Results  Component Value Date   LDLCALC 109 (H) 09/03/2020   Lab Results  Component Value Date   TRIG 73 09/03/2020   Lab Results  Component Value Date   CHOLHDL 2.6 09/03/2020   No results found for: HGBA1C    Assessment & Plan:   Problem List Items Addressed This Visit       Cardiovascular and Mediastinum   HTN (hypertension)    HTN, well controlled with current medication dose. Zestril 40 mg daily by mouth.  Continue low sodium diet and exeresis. Education provided to patient with printed hand out given.  Follow up in 3 months, labs completed: Lipid panel, CBC, CMP.       Relevant Orders   CBC with Differential   Comprehensive metabolic panel     Other   Hyperlipidemia - Primary    Patients symptoms well managed on Lipitor 40 mg tablet by mouth daily. completed labs lipid-panel.   Continue low cholesterol diet. Follow up in 3 months.        Relevant Orders   Lipid Panel   Annual physical exam    Completed head to toe assessment, education provided on health maintenance and preventative care. Patient declined COVID-19 vaccine today. No new concerns. Printed handout given to patient .       Relevant Orders   CBC with Differential   Comprehensive metabolic panel   Lipid Panel      Follow-up:  Return in about 1 year (around 12/08/2021) for physical.    Ivy Lynn, NP

## 2020-12-09 LAB — CBC WITH DIFFERENTIAL/PLATELET
Basophils Absolute: 0 10*3/uL (ref 0.0–0.2)
Basos: 0 %
EOS (ABSOLUTE): 0.1 10*3/uL (ref 0.0–0.4)
Eos: 2 %
Hematocrit: 38.3 % (ref 34.0–46.6)
Hemoglobin: 12.5 g/dL (ref 11.1–15.9)
Immature Grans (Abs): 0 10*3/uL (ref 0.0–0.1)
Immature Granulocytes: 0 %
Lymphocytes Absolute: 1.9 10*3/uL (ref 0.7–3.1)
Lymphs: 36 %
MCH: 27.9 pg (ref 26.6–33.0)
MCHC: 32.6 g/dL (ref 31.5–35.7)
MCV: 86 fL (ref 79–97)
Monocytes Absolute: 0.5 10*3/uL (ref 0.1–0.9)
Monocytes: 9 %
Neutrophils Absolute: 2.8 10*3/uL (ref 1.4–7.0)
Neutrophils: 53 %
Platelets: 232 10*3/uL (ref 150–450)
RBC: 4.48 x10E6/uL (ref 3.77–5.28)
RDW: 12.5 % (ref 11.7–15.4)
WBC: 5.3 10*3/uL (ref 3.4–10.8)

## 2020-12-09 LAB — COMPREHENSIVE METABOLIC PANEL
ALT: 21 IU/L (ref 0–32)
AST: 19 IU/L (ref 0–40)
Albumin/Globulin Ratio: 2 (ref 1.2–2.2)
Albumin: 4.4 g/dL (ref 3.7–4.7)
Alkaline Phosphatase: 73 IU/L (ref 44–121)
BUN/Creatinine Ratio: 9 — ABNORMAL LOW (ref 12–28)
BUN: 11 mg/dL (ref 8–27)
Bilirubin Total: 0.6 mg/dL (ref 0.0–1.2)
CO2: 23 mmol/L (ref 20–29)
Calcium: 9.6 mg/dL (ref 8.7–10.3)
Chloride: 104 mmol/L (ref 96–106)
Creatinine, Ser: 1.16 mg/dL — ABNORMAL HIGH (ref 0.57–1.00)
Globulin, Total: 2.2 g/dL (ref 1.5–4.5)
Glucose: 82 mg/dL (ref 65–99)
Potassium: 4.1 mmol/L (ref 3.5–5.2)
Sodium: 142 mmol/L (ref 134–144)
Total Protein: 6.6 g/dL (ref 6.0–8.5)
eGFR: 50 mL/min/{1.73_m2} — ABNORMAL LOW (ref >59)

## 2020-12-09 LAB — LIPID PANEL
Chol/HDL Ratio: 2.5 ratio (ref 0.0–4.4)
Cholesterol, Total: 187 mg/dL (ref 100–199)
HDL: 74 mg/dL (ref >39)
LDL Chol Calc (NIH): 102 mg/dL — ABNORMAL HIGH (ref 0–99)
Triglycerides: 59 mg/dL (ref 0–149)
VLDL Cholesterol Cal: 11 mg/dL (ref 5–40)

## 2020-12-12 ENCOUNTER — Other Ambulatory Visit: Payer: Self-pay

## 2021-01-19 ENCOUNTER — Other Ambulatory Visit: Payer: Self-pay | Admitting: Nurse Practitioner

## 2021-02-09 ENCOUNTER — Other Ambulatory Visit: Payer: Self-pay | Admitting: *Deleted

## 2021-02-09 DIAGNOSIS — I10 Essential (primary) hypertension: Secondary | ICD-10-CM

## 2021-02-09 MED ORDER — ESTRADIOL 0.5 MG PO TABS: 0.5000 mg | ORAL_TABLET | Freq: Every day | ORAL | 0 refills | Status: DC

## 2021-02-09 MED ORDER — LISINOPRIL 40 MG PO TABS: 40.0000 mg | ORAL_TABLET | Freq: Every day | ORAL | 0 refills | Status: DC

## 2021-02-16 ENCOUNTER — Encounter: Payer: Self-pay | Admitting: Internal Medicine

## 2021-03-29 ENCOUNTER — Other Ambulatory Visit: Payer: Self-pay | Admitting: Nurse Practitioner

## 2021-03-29 DIAGNOSIS — E7800 Pure hypercholesterolemia, unspecified: Secondary | ICD-10-CM

## 2021-04-24 ENCOUNTER — Other Ambulatory Visit: Payer: Self-pay | Admitting: Nurse Practitioner

## 2021-04-24 DIAGNOSIS — I10 Essential (primary) hypertension: Secondary | ICD-10-CM

## 2021-04-27 ENCOUNTER — Other Ambulatory Visit (HOSPITAL_COMMUNITY): Payer: Self-pay | Admitting: Nurse Practitioner

## 2021-04-27 DIAGNOSIS — Z1231 Encounter for screening mammogram for malignant neoplasm of breast: Secondary | ICD-10-CM

## 2021-04-27 NOTE — Telephone Encounter (Signed)
Je. Please make 6 mos Dec appt for HTN. Mail order sent

## 2021-04-27 NOTE — Telephone Encounter (Signed)
Appt made for 05/15/21

## 2021-05-13 ENCOUNTER — Ambulatory Visit (INDEPENDENT_AMBULATORY_CARE_PROVIDER_SITE_OTHER): Payer: Medicare Other

## 2021-05-13 VITALS — Ht <= 58 in | Wt 147.0 lb

## 2021-05-13 DIAGNOSIS — Z0000 Encounter for general adult medical examination without abnormal findings: Secondary | ICD-10-CM

## 2021-05-13 DIAGNOSIS — Z0001 Encounter for general adult medical examination with abnormal findings: Secondary | ICD-10-CM | POA: Diagnosis not present

## 2021-05-13 DIAGNOSIS — Z780 Asymptomatic menopausal state: Secondary | ICD-10-CM

## 2021-05-13 NOTE — Progress Notes (Signed)
Subjective:   Mary Wilkinson is a 75 y.o. female who presents for Medicare Annual (Subsequent) preventive examination. Virtual Visit via Telephone Note  I connected with  [employer] [place] on 05/13/21 at 11:15 AM EST by telephone and verified that I am speaking with the correct person using two identifiers.  Location: Patient: Home Provider: [facility] Persons participating in the virtual visit: patient/Nurse Health Advisor   I discussed the limitations, risks, security and privacy concerns of performing an evaluation and management service by telephone and the availability of in person appointments. The patient expressed understanding and agreed to proceed.  Interactive audio and video telecommunications were attempted between this nurse and patient, however failed, due to patient having technical difficulties OR patient did not have access to video capability.  We continued and completed visit with audio only.  Some vital signs may be absent or patient reported.   Darral Dash, LPN  Review of Systems     Cardiac Risk Factors include: advanced age (>85men, >69 women);hypertension;dyslipidemia;sedentary lifestyle;obesity (BMI >30kg/m2)     Objective:    Today's Vitals   05/13/21 1104  Weight: 147 lb (66.7 kg)  Height:  (1.473 m)   Body mass index is 30.72 kg/m.  Advanced Directives 05/13/2021 11/27/2019 06/11/2014  Does Patient Have a Medical Advance Directive? Yes Yes No;Yes  Type of Estate agent of [place];Living will - Living will  Does patient want to make changes to medical advance directive? - No - Patient declined -  Copy of Healthcare Power of Attorney in Chart? No - copy requested - -    Current Medications (verified) Outpatient Encounter Medications as of 05/13/2021  Medication Sig   atorvastatin (LIPITOR) 40 MG tablet TAKE 1 TABLET BY MOUTH  DAILY   estradiol (ESTRACE) 0.5 MG tablet TAKE 1 TABLET BY MOUTH  DAILY   JANSSEN COVID-19  VACCINE 0.5 ML injection    lisinopril (ZESTRIL) 40 MG tablet TAKE 1 TABLET BY MOUTH  DAILY   No facility-administered encounter medications on file as of 05/13/2021.    Allergies (verified) Patient has no known allergies.   History: Past Medical History:  Diagnosis Date   Allergy    Arthritis    left knee   Hyperlipidemia    Hypertension    Past Surgical History:  Procedure Laterality Date   ABDOMINAL HYSTERECTOMY     KNEE SURGERY     left   Family History  Problem Relation Age of Onset   Heart disease Mother    Kidney disease Father    Colon cancer Neg Hx    Esophageal cancer Neg Hx    Stomach cancer Neg Hx    Social History   Socioeconomic History   Marital status: Married    Spouse name: Remi Deter   Number of children: 4   Years of education: Not on file   Highest education level: Not on file  Occupational History   Occupation: Retired  Tobacco Use   Smoking status: Never   Smokeless tobacco: Never  Vaping Use   Vaping Use: Never used  Substance and Sexual Activity   Alcohol use: No    Alcohol/week: 0.0 standard drinks   Drug use: No   Sexual activity: Not Currently    Birth control/protection: None  Other Topics Concern   Not on file  Social History Narrative   Married x 57 years in 2022.   11 grandchildren   5 great grandchildren.   Social Determinants of Health  Financial Resource Strain: Low Risk    Difficulty of Paying Living Expenses: Not hard at all  Food Insecurity: No Food Insecurity   Worried About Programme researcher, broadcasting/film/video in the Last Year: Never true   Ran Out of Food in the Last Year: Never true  Transportation Needs: No Transportation Needs   Lack of Transportation (Medical): No   Lack of Transportation (Non-Medical): No  Physical Activity: Insufficiently Active   Days of Exercise per Week: 4 days   Minutes of Exercise per Session: 30 min  Stress: No Stress Concern Present   Feeling of Stress : Not at all  Social Connections:  Socially Integrated   Frequency of Communication with Friends and Family: More than three times a week   Frequency of Social Gatherings with Friends and Family: More than three times a week   Attends Religious Services: More than 4 times per year   Active Member of [employer] or Organizations: Yes   Attends Engineer, structural: More than 4 times per year   Marital Status: Married    Tobacco Counseling Counseling given: Not Answered   Clinical Intake:  Pre-visit preparation completed: Yes  Pain : No/denies pain     BMI - recorded: 30.72 Nutritional Status: BMI > 30  Obese Nutritional Risks: None Diabetes: No  How often do you need to have someone help you when you read instructions, pamphlets, or other written materials from your doctor or pharmacy?: 1 - Never  Diabetic?no  Interpreter Needed?: No  Information entered by :: Marshia Ly, LPN   Activities of Daily Living In your present state of health, do you have any difficulty performing the following activities: 05/13/2021  Hearing? N  Vision? N  Difficulty concentrating or making decisions? N  Walking or climbing stairs? N  Dressing or bathing? N  Doing errands, shopping? N  Preparing Food and eating ? N  Using the Toilet? N  In the past six months, have you accidently leaked urine? Y  Comment Stress incontinence.  Do you have problems with loss of bowel control? N  Managing your Medications? N  Managing your Finances? N  Housekeeping or managing your Housekeeping? N  Some recent data might be hidden    Patient Care Team: Daryll Drown, NP as PCP - General (Nurse Practitioner)  Indicate any recent Medical Services you may have received from other than [facility] providers in the past year (date may be approximate).     Assessment:   This is a routine wellness examination for Mary Wilkinson.  Hearing/Vision screen Hearing Screening - Comments:: No hearing issues.  Vision Screening - Comments:: Glasses.  [facility] [place]. 2022.  Dietary issues and exercise activities discussed: Current Exercise Habits: Home exercise routine, Type of exercise: walking, Time (Minutes): 30, Frequency (Times/Week): 4, Weekly Exercise (Minutes/Week): 120, Intensity: Mild, Exercise limited by: cardiac condition(s);orthopedic condition(s)   Goals Addressed             This Visit's Progress    Exercise 3x per week (30 min per time)       Increase exercise as tolerated. Would like to read more.       Depression Screen PHQ 2/9 Scores 05/13/2021 12/08/2020 09/03/2020 11/27/2019 05/16/2019 11/14/2018 11/11/2017  PHQ - 2 Score 0 0 0 0 0 0 0  PHQ- 9 Score - - - - - - -    Fall Risk Fall Risk  05/13/2021 12/08/2020 09/03/2020 11/27/2019 05/16/2019  Falls in the past year? 0 0 0 0 0  Number falls in past yr: 0 - - - -  Injury with Fall? 0 - - - -  Risk for fall due to : No Fall Risks - - No Fall Risks -  Follow up Falls prevention discussed - - Falls evaluation completed Falls evaluation completed    FALL RISK PREVENTION PERTAINING TO THE HOME:  Any stairs in or around the home? Yes  If so, are there any without handrails? No  Home free of loose throw rugs in walkways, pet beds, electrical cords, etc? Yes  Adequate lighting in your home to reduce risk of falls? Yes   ASSISTIVE DEVICES UTILIZED TO PREVENT FALLS:  Life alert? No  Use of a cane, walker or w/c? No  Grab bars in the bathroom? Yes  Shower chair or bench in shower? Yes  Elevated toilet seat or a handicapped toilet? Yes   TIMED UP AND GO:  Was the test performed? No .  Phone visit.   Cognitive Function:     6CIT Screen 05/13/2021  What Year? 0 points  What month? 0 points  What time? 0 points  Count back from 20 0 points  Months in reverse 0 points  Repeat phrase 0 points  Total Score 0    Immunizations Immunization History  Administered Date(s) Administered   Hepatitis B 04/13/1996, 05/18/1996, 08/17/1996   Janssen (J&J) SARS-COV-2  Vaccination 06/18/2020   Pneumococcal Conjugate-13 10/12/2013   Pneumococcal Polysaccharide-23 10/23/2014   Td 09/14/2011   Tdap 09/14/2011    TDAP status: Up to date  Flu Vaccine status: Declined, Education has been provided regarding the importance of this vaccine but patient still declined. Advised may receive this vaccine at local pharmacy or Health Dept. Aware to provide a copy of the vaccination record if obtained from local pharmacy or Health Dept. Verbalized acceptance and understanding.  Pneumococcal vaccine status: Up to date  Covid-19 vaccine status: Information provided on how to obtain vaccines.   Qualifies for Shingles Vaccine? Yes   Zostavax completed No   Shingrix Completed?: No.    Education has been provided regarding the importance of this vaccine. Patient has been advised to call insurance company to determine out of pocket expense if they have not yet received this vaccine. Advised may also receive vaccine at local pharmacy or Health Dept. Verbalized acceptance and understanding.  Screening Tests Health Maintenance  Topic Date Due   Zoster Vaccines- Shingrix (1 of 2) Never done   COVID-19 Vaccine (2 - Booster for Janssen series) 08/13/2020   COLONOSCOPY (Pts 45-40yrs Insurance coverage will need to be confirmed)  02/02/2021   INFLUENZA VACCINE  04/20/2029 (Originally 01/12/2021)   TETANUS/TDAP  10/07/2021   MAMMOGRAM  05/28/2022   Pneumonia Vaccine 82+ Years old  Completed   DEXA SCAN  Completed   Hepatitis C Screening  Completed   HPV VACCINES  Aged Out    Health Maintenance  Health Maintenance Due  Topic Date Due   Zoster Vaccines- Shingrix (1 of 2) Never done   COVID-19 Vaccine (2 - Booster for [employer] series) 08/13/2020   COLONOSCOPY (Pts 45-75yrs Insurance coverage will need to be confirmed)  02/02/2021    Colorectal cancer screening: Type of screening: Colonoscopy. Completed 02/03/2011. Repeat every 10 years  Mammogram status: Ordered SCHEDULED FOR  06/04/2021. Pt provided with contact info and advised to call to schedule appt.   Bone Density status: Ordered 05/13/2021. Pt provided with contact info and advised to call to schedule appt.  Lung Cancer Screening: (Low Dose  CT Chest recommended if Age 41-80 years, 30 pack-year currently smoking OR have quit w/in 15years.) does not qualify.  NON SMOKER  Additional Screening:  Hepatitis C Screening: does qualify; Completed DUE  Vision Screening: Recommended annual ophthalmology exams for early detection of glaucoma and other disorders of the eye. Is the patient up to date with their annual eye exam?  Yes  Who is the provider or what is the name of the office in which the patient attends annual eye exams? [facility] [place] If pt is not established with a provider, would they like to be referred to a provider to establish care? No .   Dental Screening: Recommended annual dental exams for proper oral hygiene  Community Resource Referral / Chronic Care Management: CRR required this visit?  No   CCM required this visit?  No      Plan:     I have personally reviewed and noted the following in the patient's chart:   Medical and social history Use of alcohol, tobacco or illicit drugs  Current medications and supplements including opioid prescriptions.  Functional ability and status Nutritional status Physical activity Advanced directives List of other physicians Hospitalizations, surgeries, and ER visits in previous 12 months Vitals Screenings to include cognitive, depression, and falls Referrals and appointments  In addition, I have reviewed and discussed with patient certain preventive protocols, quality metrics, and best practice recommendations. A written personalized care plan for preventive services as well as general preventive health recommendations were provided to patient.     Darral Dash, LPN   [id number]   Nurse Notes: Phone visit. Pt has mammogram scheduled  for December. Bone density ordered today at patient's request. Pt declined scheduling colonoscopy at this time. Pt declines flu vaccine. Pt has follow up appointment on 05/15/2021 and would like to discuss having hormone levels checked because she is still having hot flashes that keeps her from sleeping at night.

## 2021-05-13 NOTE — Patient Instructions (Signed)
Ms. Kooy , Thank you for taking time to come for your Medicare Wellness Visit. I appreciate your ongoing commitment to your health goals. Please review the following plan we discussed and let me know if I can assist you in the future.   Screening recommendations/referrals: Colonoscopy: Done 02/03/2011 Repeat in 10 years  Mammogram: Scheduled for 06/04/2021 Bone Density: Done 02/20/2014. Order placed today.  Recommended yearly ophthalmology/optometry visit for glaucoma screening and checkup Recommended yearly dental visit for hygiene and checkup  Vaccinations: Influenza vaccine: Declined. Pneumococcal vaccine: Done 10/12/2013 and 10/23/2014 Tdap vaccine: Done 09/14/2011 Repeat in 10 years  Shingles vaccine: Shingrix discussed. Please contact your pharmacy for coverage information.     Covid-19:Done 06/18/2020.  Advanced directives: Please bring a copy of your health care power of attorney and living will to the office to be added to your chart at your convenience.   Conditions/risks identified: Aim for 30 minutes of exercise or walking each day, drink 6-8 glasses of water and eat lots of fruits and vegetables.   Next appointment: Follow up in one year for your annual wellness visit 2023.   Preventive Care 73 Years and Older, Female Preventive care refers to lifestyle choices and visits with your health care provider that can promote health and wellness. What does preventive care include? A yearly physical exam. This is also called an annual well check. Dental exams once or twice a year. Routine eye exams. Ask your health care provider how often you should have your eyes checked. Personal lifestyle choices, including: Daily care of your teeth and gums. Regular physical activity. Eating a healthy diet. Avoiding tobacco and drug use. Limiting alcohol use. Practicing safe sex. Taking low-dose aspirin every day. Taking vitamin and mineral supplements as recommended by your health care  provider. What happens during an annual well check? The services and screenings done by your health care provider during your annual well check will depend on your age, overall health, lifestyle risk factors, and family history of disease. Counseling  Your health care provider may ask you questions about your: Alcohol use. Tobacco use. Drug use. Emotional well-being. Home and relationship well-being. Sexual activity. Eating habits. History of falls. Memory and ability to understand (cognition). Work and work Astronomer. Reproductive health. Screening  You may have the following tests or measurements: Height, weight, and BMI. Blood pressure. Lipid and cholesterol levels. These may be checked every 5 years, or more frequently if you are over 45 years old. Skin check. Lung cancer screening. You may have this screening every year starting at age 73 if you have a 30-pack-year history of smoking and currently smoke or have quit within the past 15 years. Fecal occult blood test (FOBT) of the stool. You may have this test every year starting at age 73. Flexible sigmoidoscopy or colonoscopy. You may have a sigmoidoscopy every 5 years or a colonoscopy every 10 years starting at age 73. Hepatitis C blood test. Hepatitis B blood test. Sexually transmitted disease (STD) testing. Diabetes screening. This is done by checking your blood sugar (glucose) after you have not eaten for a while (fasting). You may have this done every 1-3 years. Bone density scan. This is done to screen for osteoporosis. You may have this done starting at age 73. Mammogram. This may be done every 1-2 years. Talk to your health care provider about how often you should have regular mammograms. Talk with your health care provider about your test results, treatment options, and if necessary, the need for more  tests. Vaccines  Your health care provider may recommend certain vaccines, such as: Influenza vaccine. This is  recommended every year. Tetanus, diphtheria, and acellular pertussis (Tdap, Td) vaccine. You may need a Td booster every 10 years. Zoster vaccine. You may need this after age 73. Pneumococcal 13-valent conjugate (PCV13) vaccine. One dose is recommended after age 73. Pneumococcal polysaccharide (PPSV23) vaccine. One dose is recommended after age 73. Talk to your health care provider about which screenings and vaccines you need and how often you need them. This information is not intended to replace advice given to you by your health care provider. Make sure you discuss any questions you have with your health care provider. Document Released: 06/27/2015 Document Revised: 02/18/2016 Document Reviewed: 04/01/2015 Elsevier Interactive Patient Education  2017 [place] Prevention in the Home Falls can cause injuries. They can happen to people of all ages. There are many things you can do to make your home safe and to help prevent falls. What can I do on the outside of my home? Regularly fix the edges of walkways and driveways and fix any cracks. Remove anything that might make you trip as you walk through a door, such as a raised step or threshold. Trim any bushes or trees on the path to your home. Use bright outdoor lighting. Clear any walking paths of anything that might make someone trip, such as rocks or tools. Regularly check to see if handrails are loose or broken. Make sure that both sides of any steps have handrails. Any raised decks and porches should have guardrails on the edges. Have any leaves, snow, or ice cleared regularly. Use sand or salt on walking paths during winter. Clean up any spills in your garage right away. This includes oil or grease spills. What can I do in the bathroom? Use night lights. Install grab bars by the toilet and in the tub and shower. Do not use towel bars as grab bars. Use non-skid mats or decals in the tub or shower. If you need to sit down in  the shower, use a plastic, non-slip stool. Keep the floor dry. Clean up any water that spills on the floor as soon as it happens. Remove soap buildup in the tub or shower regularly. Attach bath mats securely with double-sided non-slip rug tape. Do not have throw rugs and other things on the floor that can make you trip. What can I do in the bedroom? Use night lights. Make sure that you have a light by your bed that is easy to reach. Do not use any sheets or blankets that are too big for your bed. They should not hang down onto the floor. Have a firm chair that has side arms. You can use this for support while you get dressed. Do not have throw rugs and other things on the floor that can make you trip. What can I do in the kitchen? Clean up any spills right away. Avoid walking on wet floors. Keep items that you use a lot in easy-to-reach places. If you need to reach something above you, use a strong step stool that has a grab bar. Keep electrical cords out of the way. Do not use floor polish or wax that makes floors slippery. If you must use wax, use non-skid floor wax. Do not have throw rugs and other things on the floor that can make you trip. What can I do with my stairs? Do not leave any items on the stairs. Make sure  that there are handrails on both sides of the stairs and use them. Fix handrails that are broken or loose. Make sure that handrails are as long as the stairways. Check any carpeting to make sure that it is firmly attached to the stairs. Fix any carpet that is loose or worn. Avoid having throw rugs at the top or bottom of the stairs. If you do have throw rugs, attach them to the floor with carpet tape. Make sure that you have a light switch at the top of the stairs and the bottom of the stairs. If you do not have them, ask someone to add them for you. What else can I do to help prevent falls? Wear shoes that: Do not have high heels. Have rubber bottoms. Are comfortable  and fit you well. Are closed at the toe. Do not wear sandals. If you use a stepladder: Make sure that it is fully opened. Do not climb a closed stepladder. Make sure that both sides of the stepladder are locked into place. Ask someone to hold it for you, if possible. Clearly mark and make sure that you can see: Any grab bars or handrails. First and last steps. Where the edge of each step is. Use tools that help you move around (mobility aids) if they are needed. These include: Canes. Walkers. Scooters. Crutches. Turn on the lights when you go into a dark area. Replace any light bulbs as soon as they burn out. Set up your furniture so you have a clear path. Avoid moving your furniture around. If any of your floors are uneven, fix them. If there are any pets around you, be aware of where they are. Review your medicines with your doctor. Some medicines can make you feel dizzy. This can increase your chance of falling. Ask your doctor what other things that you can do to help prevent falls. This information is not intended to replace advice given to you by your health care provider. Make sure you discuss any questions you have with your health care provider. Document Released: 03/27/2009 Document Revised: 11/06/2015 Document Reviewed: 07/05/2014 Elsevier Interactive Patient Education  2017 [employer].

## 2021-05-15 ENCOUNTER — Encounter: Payer: Self-pay | Admitting: Nurse Practitioner

## 2021-05-15 ENCOUNTER — Ambulatory Visit (INDEPENDENT_AMBULATORY_CARE_PROVIDER_SITE_OTHER): Payer: Medicare Other | Admitting: Nurse Practitioner

## 2021-05-15 VITALS — BP 152/84 | HR 63 | Temp 97.6°F | Resp 20 | Ht <= 58 in | Wt 148.0 lb

## 2021-05-15 DIAGNOSIS — N951 Menopausal and female climacteric states: Secondary | ICD-10-CM | POA: Diagnosis not present

## 2021-05-15 DIAGNOSIS — I10 Essential (primary) hypertension: Secondary | ICD-10-CM | POA: Diagnosis not present

## 2021-05-15 NOTE — Progress Notes (Signed)
Established Patient Office Visit  Subjective:  Patient ID: Mary Wilkinson, female    DOB: 1945/09/20  Age: 75 y.o. MRN: [id number]  CC:  Chief Complaint  Patient presents with   Medical Management of Chronic Issues    HPI Mary Wilkinson presents for Hypertension, follow-up  BP Readings from Last 3 Encounters:  05/15/21 (!) 152/84  12/08/20 138/71  09/03/20 (!) 146/84   Wt Readings from Last 3 Encounters:  05/15/21 148 lb (67.1 kg)  05/13/21 147 lb (66.7 kg)  12/08/20 147 lb (66.7 kg)     Mary Wilkinson was last seen for hypertension 9 years ago.  BP at that visit was unknown. Management since that visit includes lisinopril 40 mg tablet by mouth daily.  Mary Wilkinson reports good compliance with treatment. Mary Wilkinson is not having side effects.  Mary Wilkinson is following a Low Sodium diet. Mary Wilkinson is not exercising. Mary Wilkinson does not smoke.  Use of agents associated with hypertension: estrogens.   Outside blood pressures are patient did not bring the log to visit. Symptoms: No chest pain No chest pressure  No palpitations No syncope  No dyspnea No orthopnea  No paroxysmal nocturnal dyspnea No lower extremity edema   Pertinent labs: Lab Results  Component Value Date   CHOL 187 12/08/2020   HDL 74 12/08/2020   LDLCALC 102 (H) 12/08/2020   TRIG 59 12/08/2020   CHOLHDL 2.5 12/08/2020   Lab Results  Component Value Date   NA 142 12/08/2020   K 4.1 12/08/2020   CREATININE 1.16 (H) 12/08/2020   EGFR 50 (L) 12/08/2020   GLUCOSE 82 12/08/2020     The 10-year ASCVD risk score (Arnett DK, et al., 2019) is: 18.5%    Concerning patient's postmenopausal symptoms, hot flashes and night sweats.  Patient's symptoms are worsening.  Medication worsening symptoms are estradiol 0.5 mg tablet daily.  Patient reports medication has been prescribed for a long time and have concerns regarding medication administration.  Current medication estradiol [Estrace] 0.5 mg tablet by mouth.   Past Medical History:  Diagnosis  Date   Allergy    Arthritis    left knee   Hyperlipidemia    Hypertension     Past Surgical History:  Procedure Laterality Date   ABDOMINAL HYSTERECTOMY     KNEE SURGERY     left    Family History  Problem Relation Age of Onset   Heart disease Mother    Kidney disease Father    Colon cancer Neg Hx    Esophageal cancer Neg Hx    Stomach cancer Neg Hx     Social History   Socioeconomic History   Marital status: Married    Spouse name: Mikeal Hawthorne   Number of children: 4   Years of education: Not on file   Highest education level: Not on file  Occupational History   Occupation: Retired  Tobacco Use   Smoking status: Never   Smokeless tobacco: Never  Vaping Use   Vaping Use: Never used  Substance and Sexual Activity   Alcohol use: No    Alcohol/week: 0.0 standard drinks   Drug use: No   Sexual activity: Not Currently    Birth control/protection: None  Other Topics Concern   Not on file  Social History Narrative   Married x 56 years in 2022.   11 grandchildren   5 great grandchildren.   Social Determinants of Health   Financial Resource Strain: Low Risk    Difficulty of Paying Living Expenses:  Not hard at all  Food Insecurity: No Food Insecurity   Worried About Charity fundraiser in the Last Year: Never true   Ran Out of Food in the Last Year: Never true  Transportation Needs: No Transportation Needs   Lack of Transportation (Medical): No   Lack of Transportation (Non-Medical): No  Physical Activity: Insufficiently Active   Days of Exercise per Week: 4 days   Minutes of Exercise per Session: 30 min  Stress: No Stress Concern Present   Feeling of Stress : Not at all  Social Connections: Socially Integrated   Frequency of Communication with Friends and Family: More than three times a week   Frequency of Social Gatherings with Friends and Family: More than three times a week   Attends Religious Services: More than 4 times per year   Active Member of [employer] or  Organizations: Yes   Attends Music therapist: More than 4 times per year   Marital Status: Married  Human resources officer Violence: Not At Risk   Fear of Current or Ex-Partner: No   Emotionally Abused: No   Physically Abused: No   Sexually Abused: No    Outpatient Medications Prior to Visit  Medication Sig Dispense Refill   atorvastatin (LIPITOR) 40 MG tablet TAKE 1 TABLET BY MOUTH  DAILY 90 tablet 0   estradiol (ESTRACE) 0.5 MG tablet TAKE 1 TABLET BY MOUTH  DAILY 90 tablet 1   lisinopril (ZESTRIL) 40 MG tablet TAKE 1 TABLET BY MOUTH  DAILY 90 tablet 0   Saccharomyces boulardii (PROBIOTIC) 250 MG CAPS Take by mouth.     JANSSEN COVID-19 VACCINE 0.5 ML injection  (Patient not taking: Reported on 05/15/2021)     No facility-administered medications prior to visit.    No Known Allergies  ROS Review of Systems  HENT: Negative.    Eyes: Negative.   Respiratory: Negative.    Cardiovascular: Negative.   Gastrointestinal: Negative.   Musculoskeletal: Negative.   Skin: Negative.  Negative for rash.  Psychiatric/Behavioral: Negative.    All other systems reviewed and are negative.    Objective:    Physical Exam Vitals and nursing note reviewed.  Constitutional:      Appearance: Normal appearance.  HENT:     Head: Normocephalic.     Nose: Nose normal.     Mouth/Throat:     Mouth: Mucous membranes are moist.     Pharynx: Oropharynx is clear.  Eyes:     Conjunctiva/sclera: Conjunctivae normal.  Cardiovascular:     Rate and Rhythm: Normal rate and regular rhythm.  Pulmonary:     Effort: Pulmonary effort is normal.     Breath sounds: Normal breath sounds.  Abdominal:     General: Bowel sounds are normal.  Skin:    General: Skin is warm.     Findings: No rash.  Neurological:     Mental Status: Mary Wilkinson is alert and oriented to person, place, and time.  Psychiatric:        Behavior: Behavior normal.    BP (!) 152/84   Pulse 63   Temp 97.6 F (36.4 C) (Temporal)    Resp 20   Ht _0  (1.473 m)   Wt 148 lb (67.1 kg)   SpO2 99%   BMI 30.93 kg/m  Wt Readings from Last 3 Encounters:  05/15/21 148 lb (67.1 kg)  05/13/21 147 lb (66.7 kg)  12/08/20 147 lb (66.7 kg)     Health Maintenance Due  Topic Date  Due   Zoster Vaccines- Shingrix (1 of 2) Never done   COVID-19 Vaccine (2 - Booster for Janssen series) 08/13/2020   COLONOSCOPY (Pts 45-11yr Insurance coverage will need to be confirmed)  02/02/2021    There are no preventive care reminders to display for this patient.  No results found for: TSH Lab Results  Component Value Date   WBC 5.3 12/08/2020   HGB 12.5 12/08/2020   HCT 38.3 12/08/2020   MCV 86 12/08/2020   PLT 232 12/08/2020   Lab Results  Component Value Date   NA 142 12/08/2020   K 4.1 12/08/2020   CO2 23 12/08/2020   GLUCOSE 82 12/08/2020   BUN 11 12/08/2020   CREATININE 1.16 (H) 12/08/2020   BILITOT 0.6 12/08/2020   ALKPHOS 73 12/08/2020   AST 19 12/08/2020   ALT 21 12/08/2020   PROT 6.6 12/08/2020   ALBUMIN 4.4 12/08/2020   CALCIUM 9.6 12/08/2020   EGFR 50 (L) 12/08/2020   Lab Results  Component Value Date   CHOL 187 12/08/2020   Lab Results  Component Value Date   HDL 74 12/08/2020   Lab Results  Component Value Date   LDLCALC 102 (H) 12/08/2020   Lab Results  Component Value Date   TRIG 59 12/08/2020   Lab Results  Component Value Date   CHOLHDL 2.5 12/08/2020   No results found for: HGBA1C    Assessment & Plan:   Problem List Items Addressed This Visit       Cardiovascular and Mediastinum   HTN (hypertension) - Primary    Hypertension not well controlled.  Patient reports blood pressure at home is not this high.  I will keep a blood pressure log and follow-up on Monday.  Continue to educate patient emphasizing a low-sodium diet, keep a blood pressure log daily before and after medication.  Completed labs-CBC, CMP, lipid panel      Relevant Orders   CBC with Differential   Lipid  Panel   Comprehensive metabolic panel     Other   Post menopausal syndrome    Plan to taper off estradiol starting Monday half a dose of current dose from 0.5-0.25 for 2 to 4 weeks Education provided to patient on long-term use of estradiol Printed handouts given       No orders of the defined types were placed in this encounter.   Follow-up: Return in about 6 months (around 11/13/2021).    OIvy Lynn NP

## 2021-05-15 NOTE — Assessment & Plan Note (Signed)
Hypertension not well controlled.  Patient reports blood pressure at home is not this high.  I will keep a blood pressure log and follow-up on Monday.  Continue to educate patient emphasizing a low-sodium diet, keep a blood pressure log daily before and after medication.  Completed labs-CBC, CMP, lipid panel

## 2021-05-15 NOTE — Assessment & Plan Note (Signed)
Plan to taper off estradiol starting Monday half a dose of current dose from 0.5-0.25 for 2 to 4 weeks Education provided to patient on long-term use of estradiol Printed handouts given

## 2021-05-15 NOTE — Patient Instructions (Addendum)
Postmenopausal Bleeding Postmenopausal bleeding is any bleeding that a woman has after she has entered menopause. Menopause is the end of a woman's fertile years. After menopause, a woman no longer ovulates and does not have menstrual periods. Therefore, she should no longer have bleeding from her vagina. Postmenopausal bleeding may have various causes, including: Menopausal hormone therapy (MHT). Endometrial atrophy. After menopause, low estrogen hormone levels cause the membrane that lines the uterus (endometrium) to become thin. You may have bleeding as the endometrium thins. Endometrial hyperplasia. This condition is caused by excess estrogen hormones and low levels of progesterone hormones. The excess estrogen causes the endometrium to thicken, which can lead to bleeding. In some cases, this can lead to cancer of the uterus. Endometrial cancer. Noncancerous growths (polyps) on the endometrium, the lining of the uterus, or the cervix. Uterine fibroids. These are noncancerous growths in or around the uterus muscle tissue that can cause heavy bleeding. Any type of postmenopausal bleeding, even if it appears to be a typical menstrual period, should be checked by your health care provider. Treatment will depend on the cause of the bleeding. Follow these instructions at home:  Pay attention to any changes in your symptoms. Let your health care provider know about them. Avoid using tampons and douches as told by your health care provider. Change your pads regularly. Get regular pelvic exams, including Pap tests, as told by your health care provider. Take iron supplements as told by your health care provider. Take over-the-counter and prescription medicines only as told by your health care provider. Keep all follow-up visits. This is important. Contact a health care provider if: You have new bleeding from the vagina after menopause. You have pain in your abdomen. Get help right away if: You have  a fever or chills. You have severe pain with bleeding. You are passing blood clots. You have heavy bleeding, need more than 1 pad an hour, and have never experienced this before. You have headaches or feel faint or dizzy. Summary Postmenopausal bleeding is any bleeding that a woman has after she has entered into menopause. Postmenopausal bleeding may have various causes. Treatment will depend on the cause of the bleeding. Any type of postmenopausal bleeding, even if it appears to be a typical menstrual period, should be checked by your health care provider. Be sure to pay attention to any changes in your symptoms and keep all follow-up visits. This information is not intended to replace advice given to you by your health care provider. Make sure you discuss any questions you have with your health care provider. Document Revised: 11/15/2019 Document Reviewed: 11/15/2019 Elsevier Patient Education  2022 [employer] Hypertension, Adult Hypertension is another name for high blood pressure. High blood pressure forces your heart to work harder to pump blood. This can cause problems over time. There are two numbers in a blood pressure reading. There is a top number (systolic) over a bottom number (diastolic). It is best to have a blood pressure that is below 120/80. Healthy choices can help lower your blood pressure, or you may need medicine to help lower it. What are the causes? The cause of this condition is not known. Some conditions may be related to high blood pressure. What increases the risk? Smoking. Having type 2 diabetes mellitus, high cholesterol, or both. Not getting enough exercise or physical activity. Being overweight. Having too much fat, sugar, calories, or salt (sodium) in your diet. Drinking too much alcohol. Having long-term (chronic) kidney disease. Having a family  history of high blood pressure. Age. Risk increases with age. Race. You may be at higher risk if you are  African American. Gender. Men are at higher risk than women before age 78. After age 47, women are at higher risk than men. Having obstructive sleep apnea. Stress. What are the signs or symptoms? High blood pressure may not cause symptoms. Very high blood pressure (hypertensive crisis) may cause: Headache. Feelings of worry or nervousness (anxiety). Shortness of breath. Nosebleed. A feeling of being sick to your stomach (nausea). Throwing up (vomiting). Changes in how you see. Very bad chest pain. Seizures. How is this treated? This condition is treated by making healthy lifestyle changes, such as: Eating healthy foods. Exercising more. Drinking less alcohol. Your health care provider may prescribe medicine if lifestyle changes are not enough to get your blood pressure under control, and if: Your top number is above 130. Your bottom number is above 80. Your personal target blood pressure may vary. Follow these instructions at home: Eating and drinking  If told, follow the DASH eating plan. To follow this plan: Fill one half of your plate at each meal with fruits and vegetables. Fill one fourth of your plate at each meal with whole grains. Whole grains include whole-wheat pasta, brown rice, and whole-grain bread. Eat or drink low-fat dairy products, such as skim milk or low-fat yogurt. Fill one fourth of your plate at each meal with low-fat (lean) proteins. Low-fat proteins include fish, chicken without skin, eggs, beans, and tofu. Avoid fatty meat, cured and processed meat, or chicken with skin. Avoid pre-made or processed food. Eat less than 1,500 mg of salt each day. Do not drink alcohol if: Your doctor tells you not to drink. You are pregnant, may be pregnant, or are planning to become pregnant. If you drink alcohol: Limit how much you use to: 0-1 drink a day for women. 0-2 drinks a day for men. Be aware of how much alcohol is in your drink. In the [place] one drink equals  one 12 oz bottle of beer (355 mL), one 5 oz glass of wine (148 mL), or one 1 oz glass of hard liquor (44 mL). Lifestyle  Work with your doctor to stay at a healthy weight or to lose weight. Ask your doctor what the best weight is for you. Get at least 30 minutes of exercise most days of the week. This may include walking, swimming, or biking. Get at least 30 minutes of exercise that strengthens your muscles (resistance exercise) at least 3 days a week. This may include lifting weights or doing Pilates. Do not use any products that contain nicotine or tobacco, such as cigarettes, e-cigarettes, and chewing tobacco. If you need help quitting, ask your doctor. Check your blood pressure at home as told by your doctor. Keep all follow-up visits as told by your doctor. This is important. Medicines Take over-the-counter and prescription medicines only as told by your doctor. Follow directions carefully. Do not skip doses of blood pressure medicine. The medicine does not work as well if you skip doses. Skipping doses also puts you at risk for problems. Ask your doctor about side effects or reactions to medicines that you should watch for. Contact a doctor if you: Think you are having a reaction to the medicine you are taking. Have headaches that keep coming back (recurring). Feel dizzy. Have swelling in your ankles. Have trouble with your vision. Get help right away if you: Get a very bad headache. Start to  feel mixed up (confused). Feel weak or numb. Feel faint. Have very bad pain in your: Chest. Belly (abdomen). Throw up more than once. Have trouble breathing. Summary Hypertension is another name for high blood pressure. High blood pressure forces your heart to work harder to pump blood. For most people, a normal blood pressure is less than 120/80. Making healthy choices can help lower blood pressure. If your blood pressure does not get lower with healthy choices, you may need to take  medicine. This information is not intended to replace advice given to you by your health care provider. Make sure you discuss any questions you have with your health care provider. Document Revised: 02/08/2018 Document Reviewed: 02/08/2018 Elsevier Patient Education  2022 [employer].

## 2021-05-16 LAB — COMPREHENSIVE METABOLIC PANEL
ALT: 17 IU/L (ref 0–32)
AST: 19 IU/L (ref 0–40)
Albumin/Globulin Ratio: 1.9 (ref 1.2–2.2)
Albumin: 4.5 g/dL (ref 3.7–4.7)
Alkaline Phosphatase: 68 IU/L (ref 44–121)
BUN/Creatinine Ratio: 10 — ABNORMAL LOW (ref 12–28)
BUN: 9 mg/dL (ref 8–27)
Bilirubin Total: 0.6 mg/dL (ref 0.0–1.2)
CO2: 24 mmol/L (ref 20–29)
Calcium: 10.2 mg/dL (ref 8.7–10.3)
Chloride: 105 mmol/L (ref 96–106)
Creatinine, Ser: 0.93 mg/dL (ref 0.57–1.00)
Globulin, Total: 2.4 g/dL (ref 1.5–4.5)
Glucose: 81 mg/dL (ref 70–99)
Potassium: 3.9 mmol/L (ref 3.5–5.2)
Sodium: 142 mmol/L (ref 134–144)
Total Protein: 6.9 g/dL (ref 6.0–8.5)
eGFR: 65 mL/min/{1.73_m2} (ref >59)

## 2021-05-16 LAB — CBC WITH DIFFERENTIAL/PLATELET
Basophils Absolute: 0 10*3/uL (ref 0.0–0.2)
Basos: 0 %
EOS (ABSOLUTE): 0.1 10*3/uL (ref 0.0–0.4)
Eos: 1 %
Hematocrit: 38.1 % (ref 34.0–46.6)
Hemoglobin: 12.6 g/dL (ref 11.1–15.9)
Immature Grans (Abs): 0 10*3/uL (ref 0.0–0.1)
Immature Granulocytes: 0 %
Lymphocytes Absolute: 2 10*3/uL (ref 0.7–3.1)
Lymphs: 42 %
MCH: 27.6 pg (ref 26.6–33.0)
MCHC: 33.1 g/dL (ref 31.5–35.7)
MCV: 84 fL (ref 79–97)
Monocytes Absolute: 0.4 10*3/uL (ref 0.1–0.9)
Monocytes: 8 %
Neutrophils Absolute: 2.4 10*3/uL (ref 1.4–7.0)
Neutrophils: 49 %
Platelets: 256 10*3/uL (ref 150–450)
RBC: 4.56 x10E6/uL (ref 3.77–5.28)
RDW: 12.9 % (ref 11.7–15.4)
WBC: 4.9 10*3/uL (ref 3.4–10.8)

## 2021-05-16 LAB — LIPID PANEL
Chol/HDL Ratio: 3.1 ratio (ref 0.0–4.4)
Cholesterol, Total: 205 mg/dL — ABNORMAL HIGH (ref 100–199)
HDL: 66 mg/dL (ref >39)
LDL Chol Calc (NIH): 122 mg/dL — ABNORMAL HIGH (ref 0–99)
Triglycerides: 96 mg/dL (ref 0–149)
VLDL Cholesterol Cal: 17 mg/dL (ref 5–40)

## 2021-05-18 ENCOUNTER — Telehealth: Payer: Self-pay | Admitting: Nurse Practitioner

## 2021-05-18 ENCOUNTER — Other Ambulatory Visit: Payer: Self-pay | Admitting: Nurse Practitioner

## 2021-05-18 DIAGNOSIS — I10 Essential (primary) hypertension: Secondary | ICD-10-CM

## 2021-05-18 MED ORDER — HYDROCHLOROTHIAZIDE 12.5 MG PO TABS: 12.5000 mg | ORAL_TABLET | Freq: Every day | ORAL | 1 refills | Status: DC

## 2021-05-18 NOTE — Telephone Encounter (Signed)
Patients BP readings Saturday-158/83, after med 142/81, night 166/82 Sunday-151/80, after med 175/91, night 175/91 Monday-161/79 right arm left arm 139/85, right 171/84, left arm 161/90

## 2021-05-18 NOTE — Telephone Encounter (Signed)
Patient aware and verbalizes understanding. 

## 2021-05-21 ENCOUNTER — Telehealth: Payer: Self-pay | Admitting: Nurse Practitioner

## 2021-05-21 NOTE — Telephone Encounter (Signed)
Lmtcb.

## 2021-05-21 NOTE — Telephone Encounter (Signed)
Mailbox full

## 2021-05-26 NOTE — Telephone Encounter (Signed)
Patient aware.

## 2021-06-04 ENCOUNTER — Other Ambulatory Visit: Payer: Self-pay

## 2021-06-04 ENCOUNTER — Ambulatory Visit (HOSPITAL_COMMUNITY)
Admission: RE | Admit: 2021-06-04 | Discharge: 2021-06-04 | Disposition: A | Discharge status: Home / Self Care | Payer: Medicare Other | Source: Ambulatory Visit | Attending: Nurse Practitioner | Admitting: Nurse Practitioner

## 2021-06-04 DIAGNOSIS — Z1231 Encounter for screening mammogram for malignant neoplasm of breast: Secondary | ICD-10-CM | POA: Insufficient documentation

## 2021-06-05 ENCOUNTER — Other Ambulatory Visit (HOSPITAL_COMMUNITY): Payer: Self-pay | Admitting: Nurse Practitioner

## 2021-06-09 ENCOUNTER — Other Ambulatory Visit: Payer: Self-pay | Admitting: Nurse Practitioner

## 2021-06-09 DIAGNOSIS — R928 Other abnormal and inconclusive findings on diagnostic imaging of breast: Secondary | ICD-10-CM

## 2021-06-10 ENCOUNTER — Other Ambulatory Visit (HOSPITAL_COMMUNITY): Payer: Self-pay | Admitting: Nurse Practitioner

## 2021-06-10 DIAGNOSIS — R928 Other abnormal and inconclusive findings on diagnostic imaging of breast: Secondary | ICD-10-CM

## 2021-06-16 ENCOUNTER — Other Ambulatory Visit: Payer: Self-pay

## 2021-06-16 ENCOUNTER — Encounter: Payer: Self-pay | Admitting: Nurse Practitioner

## 2021-06-16 ENCOUNTER — Ambulatory Visit (INDEPENDENT_AMBULATORY_CARE_PROVIDER_SITE_OTHER): Payer: Medicare PPO

## 2021-06-16 ENCOUNTER — Ambulatory Visit (HOSPITAL_COMMUNITY)
Admission: RE | Admit: 2021-06-16 | Discharge: 2021-06-16 | Disposition: A | Discharge status: Home / Self Care | Payer: Medicare PPO | Source: Ambulatory Visit | Attending: Nurse Practitioner | Admitting: Nurse Practitioner

## 2021-06-16 ENCOUNTER — Ambulatory Visit (INDEPENDENT_AMBULATORY_CARE_PROVIDER_SITE_OTHER): Payer: Medicare PPO | Admitting: Nurse Practitioner

## 2021-06-16 VITALS — BP 156/83 | HR 68 | Temp 97.7°F | Ht <= 58 in | Wt 142.4 lb

## 2021-06-16 DIAGNOSIS — I10 Essential (primary) hypertension: Secondary | ICD-10-CM

## 2021-06-16 DIAGNOSIS — R928 Other abnormal and inconclusive findings on diagnostic imaging of breast: Secondary | ICD-10-CM

## 2021-06-16 DIAGNOSIS — Z780 Asymptomatic menopausal state: Secondary | ICD-10-CM | POA: Diagnosis not present

## 2021-06-16 MED ORDER — METOPROLOL TARTRATE 25 MG PO TABS: 12.5000 mg | ORAL_TABLET | Freq: Two times a day (BID) | ORAL | 0 refills | Status: DC

## 2021-06-16 NOTE — Patient Instructions (Signed)

## 2021-06-16 NOTE — Progress Notes (Signed)
Check hormone tap  Established Patient Office Visit  Subjective:  Patient ID: Mary Blye. Wilkinson, female    DOB: [birth year]  Age: 76 y.o. MRN: [id number]  CC:  Chief Complaint  Patient presents with   Hypertension    HPI Mary Wilkinson presents for Hypertension: Patient here for follow-up of elevated blood pressure. She is exercising and is adherent to low salt diet.  Blood pressure is not well controlled at home. Cardiac symptoms none. Patient denies none.  Cardiovascular risk factors: advanced age (older than 92 for men, 30 for women) and hypertension. Use of agents associated with hypertension: estrogens. History of target organ damage: none.   Past Medical History:  Diagnosis Date   Allergy    Arthritis    left knee   Hyperlipidemia    Hypertension     Past Surgical History:  Procedure Laterality Date   ABDOMINAL HYSTERECTOMY     KNEE SURGERY     left    Family History  Problem Relation Age of Onset   Heart disease Mother    Kidney disease Father    Colon cancer Neg Hx    Esophageal cancer Neg Hx    Stomach cancer Neg Hx     Social History   Socioeconomic History   Marital status: Married    Spouse name: Mikeal Hawthorne   Number of children: 4   Years of education: Not on file   Highest education level: Not on file  Occupational History   Occupation: Retired  Tobacco Use   Smoking status: Never   Smokeless tobacco: Never  Vaping Use   Vaping Use: Never used  Substance and Sexual Activity   Alcohol use: No    Alcohol/week: 0.0 standard drinks   Drug use: No   Sexual activity: Not Currently    Birth control/protection: None  Other Topics Concern   Not on file  Social History Narrative   Married x 29 years in 2022.   11 grandchildren   5 great grandchildren.   Social Determinants of Health   Financial Resource Strain: Low Risk    Difficulty of Paying Living Expenses: Not hard at all  Food Insecurity: No Food Insecurity   Worried About Charity fundraiser in  the Last Year: Never true   [place] in the Last Year: Never true  Transportation Needs: No Transportation Needs   Lack of Transportation (Medical): No   Lack of Transportation (Non-Medical): No  Physical Activity: Insufficiently Active   Days of Exercise per Week: 4 days   Minutes of Exercise per Session: 30 min  Stress: No Stress Concern Present   Feeling of Stress : Not at all  Social Connections: Socially Integrated   Frequency of Communication with Friends and Family: More than three times a week   Frequency of Social Gatherings with Friends and Family: More than three times a week   Attends Religious Services: More than 4 times per year   Active Member of [employer] or Organizations: Yes   Attends Music therapist: More than 4 times per year   Marital Status: Married  Human resources officer Violence: Not At Risk   Fear of Current or Ex-Partner: No   Emotionally Abused: No   Physically Abused: No   Sexually Abused: No    Outpatient Medications Prior to Visit  Medication Sig Dispense Refill   atorvastatin (LIPITOR) 40 MG tablet TAKE 1 TABLET BY MOUTH  DAILY 90 tablet 0   estradiol (ESTRACE) 0.5 MG  tablet TAKE 1 TABLET BY MOUTH  DAILY 90 tablet 1   hydrochlorothiazide (HYDRODIURIL) 12.5 MG tablet Take 1 tablet (12.5 mg total) by mouth daily. 90 tablet 1   lisinopril (ZESTRIL) 40 MG tablet TAKE 1 TABLET BY MOUTH  DAILY 90 tablet 0   Saccharomyces boulardii (PROBIOTIC) 250 MG CAPS Take by mouth.     JANSSEN COVID-19 VACCINE 0.5 ML injection      No facility-administered medications prior to visit.    No Known Allergies  ROS Review of Systems  Constitutional: Negative.   HENT: Negative.    Eyes: Negative.   Respiratory: Negative.    Gastrointestinal: Negative.   Genitourinary: Negative.   Skin: Negative.   All other systems reviewed and are negative.    Objective:    Physical Exam Vitals and nursing note reviewed.  Constitutional:      Appearance:  Normal appearance.  HENT:     Head: Normocephalic.     Right Ear: External ear normal.     Nose: Nose normal.  Eyes:     Conjunctiva/sclera: Conjunctivae normal.  Cardiovascular:     Rate and Rhythm: Normal rate and regular rhythm.     Pulses: Normal pulses.     Heart sounds: Normal heart sounds.  Pulmonary:     Effort: Pulmonary effort is normal.     Breath sounds: Normal breath sounds.  Abdominal:     General: Bowel sounds are normal.  Musculoskeletal:        General: Normal range of motion.  Skin:    General: Skin is warm.     Findings: No rash.  Neurological:     Mental Status: She is alert and oriented to person, place, and time.    BP (!) 156/83    Pulse 68    Temp 97.7 F (36.5 C) (Temporal)    Ht _0  (1.473 m)    Wt 142 lb 6 oz (64.6 kg)    BMI 29.76 kg/m  Wt Readings from Last 3 Encounters:  06/16/21 142 lb 6 oz (64.6 kg)  05/15/21 148 lb (67.1 kg)  05/13/21 147 lb (66.7 kg)     Health Maintenance Due  Topic Date Due   COLONOSCOPY (Pts 45-11yr Insurance coverage will need to be confirmed)  02/02/2021    There are no preventive care reminders to display for this patient.  No results found for: TSH Lab Results  Component Value Date   WBC 4.9 05/15/2021   HGB 12.6 05/15/2021   HCT 38.1 05/15/2021   MCV 84 05/15/2021   PLT 256 05/15/2021   Lab Results  Component Value Date   NA 142 05/15/2021   K 3.9 05/15/2021   CO2 24 05/15/2021   GLUCOSE 81 05/15/2021   BUN 9 05/15/2021   CREATININE 0.93 05/15/2021   BILITOT 0.6 05/15/2021   ALKPHOS 68 05/15/2021   AST 19 05/15/2021   ALT 17 05/15/2021   PROT 6.9 05/15/2021   ALBUMIN 4.5 05/15/2021   CALCIUM 10.2 05/15/2021   EGFR 65 05/15/2021   Lab Results  Component Value Date   CHOL 205 (H) 05/15/2021   Lab Results  Component Value Date   HDL 66 05/15/2021   Lab Results  Component Value Date   LDLCALC 122 (H) 05/15/2021   Lab Results  Component Value Date   TRIG 96 05/15/2021   Lab  Results  Component Value Date   CHOLHDL 3.1 05/15/2021   No results found for: HGBA1C    Assessment &  Plan:   Problem List Items Addressed This Visit       Cardiovascular and Mediastinum   HTN (hypertension)    Discontinued hydrochlorothiazide 12.5 mg tablet by mouth due to medication side effect of fogginess and dizziness.  Started patient on  Lopressor 25 mg tablet by mouth daily.  Education provided to patient printed handouts given.  Follow-up as scheduled Rx sent to pharmacy.      Relevant Medications   metoprolol tartrate (LOPRESSOR) 25 MG tablet   Other Visit Diagnoses     Essential hypertension    -  Primary   Relevant Medications   metoprolol tartrate (LOPRESSOR) 25 MG tablet       Meds ordered this encounter  Medications   metoprolol tartrate (LOPRESSOR) 25 MG tablet    Sig: Take 0.5 tablets (12.5 mg total) by mouth 2 (two) times daily.    Dispense:  90 tablet    Refill:  0    Order Specific Question:   Supervising Provider    Answer:   Claretta Fraise [[id number]]    Follow-up: Return in about 2 months (around 08/14/2021) for as scheduled .    Ivy Lynn, NP

## 2021-06-16 NOTE — Assessment & Plan Note (Signed)
Discontinued hydrochlorothiazide 12.5 mg tablet by mouth due to medication side effect of fogginess and dizziness.  Started patient on  Lopressor 25 mg tablet by mouth daily.  Education provided to patient printed handouts given.  Follow-up as scheduled Rx sent to pharmacy.

## 2021-06-22 ENCOUNTER — Other Ambulatory Visit: Payer: Self-pay | Admitting: Nurse Practitioner

## 2021-06-22 DIAGNOSIS — E7800 Pure hypercholesterolemia, unspecified: Secondary | ICD-10-CM

## 2021-07-05 ENCOUNTER — Other Ambulatory Visit: Payer: Self-pay | Admitting: Nurse Practitioner

## 2021-07-05 DIAGNOSIS — I10 Essential (primary) hypertension: Secondary | ICD-10-CM

## 2021-08-14 ENCOUNTER — Ambulatory Visit: Payer: Medicare PPO | Admitting: Nurse Practitioner

## 2021-08-14 ENCOUNTER — Encounter: Payer: Self-pay | Admitting: Nurse Practitioner

## 2021-08-14 VITALS — BP 125/70 | HR 56 | Temp 98.4°F | Ht <= 58 in | Wt 148.0 lb

## 2021-08-14 DIAGNOSIS — I10 Essential (primary) hypertension: Secondary | ICD-10-CM

## 2021-08-14 DIAGNOSIS — N951 Menopausal and female climacteric states: Secondary | ICD-10-CM | POA: Diagnosis not present

## 2021-08-14 DIAGNOSIS — E782 Mixed hyperlipidemia: Secondary | ICD-10-CM

## 2021-08-14 LAB — LIPID PANEL

## 2021-08-14 NOTE — Patient Instructions (Signed)

## 2021-08-14 NOTE — Assessment & Plan Note (Signed)
Education provided to patient that it is safe for her to gradually discontinue estradiol as she has been on hormone therapy for a long time. ?

## 2021-08-14 NOTE — Assessment & Plan Note (Signed)
Blood pressure well controlled on current medication. No changes necessary.  Completed labs CBC CMP lipid panel.  Follow-up in 3 months. ?

## 2021-08-14 NOTE — Assessment & Plan Note (Signed)
No new signs and symptoms of hyper hyperlipidemia.  Completed lipid panel results pending.  Continue low-cholesterol diet, exercise as tolerated. ?

## 2021-08-14 NOTE — Progress Notes (Signed)
? ?Established Patient Office Visit ? ?Subjective:  ?Patient ID: Mary Wilkinson. Mary Wilkinson, female    DOB: 11/06/1945  Age: 76 y.o. MRN: [id number] ? ?CC:  ?Chief Complaint  ?Patient presents with  ? chronic disease management  ? ? ?HPI ?Particia Wilkinson. [place] presents for Pt presents for follow up of hypertension. Patient was diagnosed in 10/06/2021 blood pressure well controlled on current medication.. The patient is tolerating the medication well without side effects. Compliance with treatment has been good; including taking medication as directed , maintains a healthy diet and regular exercise regimen , and following up as directed.  ? ?Mixed hyperlipidemia  ?Pt presents with hyperlipidemia. Patient was diagnosed in 10/07/2011. Compliance with treatment has been good; The patient is compliant with medications, maintains a low cholesterol diet , follows up as directed , and maintains an exercise regimen . The patient denies experiencing any hypercholesterolemia related symptoms.   ? ?Postmenopausal symptoms ?Patient reports frequent hot flashes after tapering off of estradiol to half pill. daily.  Patient encouraged to take half pill every other day as we continue to gradually taper patient off.  Patient is no longer experiencing vaginal bleeding since reducing medication dose to half a pill. ? ? ?Past Medical History:  ?Diagnosis Date  ? Allergy   ? Arthritis   ? left knee  ? Hyperlipidemia   ? Hypertension   ? ? ?Past Surgical History:  ?Procedure Laterality Date  ? ABDOMINAL HYSTERECTOMY    ? KNEE SURGERY    ? left  ? ? ?Family History  ?Problem Relation Age of Onset  ? Heart disease Mother   ? Kidney disease Father   ? Colon cancer Neg Hx   ? Esophageal cancer Neg Hx   ? Stomach cancer Neg Hx   ? ? ?Social History  ? ?Socioeconomic History  ? Marital status: Married  ?  Spouse name: Mikeal Hawthorne  ? Number of children: 4  ? Years of education: Not on file  ? Highest education level: Not on file  ?Occupational History  ? Occupation:  Retired  ?Tobacco Use  ? Smoking status: Never  ? Smokeless tobacco: Never  ?Vaping Use  ? Vaping Use: Never used  ?Substance and Sexual Activity  ? Alcohol use: No  ?  Alcohol/week: 0.0 standard drinks  ? Drug use: No  ? Sexual activity: Not Currently  ?  Birth control/protection: None  ?Other Topics Concern  ? Not on file  ?Social History Narrative  ? Married x 57 years in 2022.  ? 11 grandchildren  ? 5 great grandchildren.  ? ?Social Determinants of Health  ? ?Financial Resource Strain: Low Risk   ? Difficulty of Paying Living Expenses: Not hard at all  ?Food Insecurity: No Food Insecurity  ? Worried About Charity fundraiser in the Last Year: Never true  ? Ran Out of Food in the Last Year: Never true  ?Transportation Needs: No Transportation Needs  ? Lack of Transportation (Medical): No  ? Lack of Transportation (Non-Medical): No  ?Physical Activity: Insufficiently Active  ? Days of Exercise per Week: 4 days  ? Minutes of Exercise per Session: 30 min  ?Stress: No Stress Concern Present  ? Feeling of Stress : Not at all  ?Social Connections: Socially Integrated  ? Frequency of Communication with Friends and Family: More than three times a week  ? Frequency of Social Gatherings with Friends and Family: More than three times a week  ? Attends Religious Services: More than 4 times  per year  ? Active Member of Clubs or Organizations: Yes  ? Attends Archivist Meetings: More than 4 times per year  ? Marital Status: Married  ?Intimate Partner Violence: Not At Risk  ? Fear of Current or Ex-Partner: No  ? Emotionally Abused: No  ? Physically Abused: No  ? Sexually Abused: No  ? ? ?Outpatient Medications Prior to Visit  ?Medication Sig Dispense Refill  ? atorvastatin (LIPITOR) 40 MG tablet TAKE 1 TABLET BY MOUTH  DAILY 90 tablet 0  ? estradiol (ESTRACE) 0.5 MG tablet TAKE 1 TABLET BY MOUTH  DAILY 90 tablet 1  ? hydrochlorothiazide (HYDRODIURIL) 12.5 MG tablet Take 1 tablet (12.5 mg total) by mouth daily. 90  tablet 1  ? lisinopril (ZESTRIL) 40 MG tablet TAKE 1 TABLET BY MOUTH DAILY 90 tablet 0  ? metoprolol tartrate (LOPRESSOR) 25 MG tablet Take 0.5 tablets (12.5 mg total) by mouth 2 (two) times daily. 90 tablet 0  ? Saccharomyces boulardii (PROBIOTIC) 250 MG CAPS Take by mouth.    ? ?No facility-administered medications prior to visit.  ? ? ?No Known Allergies ? ?ROS ?Review of Systems  ?Constitutional: Negative.   ?HENT: Negative.    ?Eyes: Negative.   ?Respiratory: Negative.    ?Cardiovascular: Negative.   ?Endocrine: Negative.   ?Genitourinary: Negative.   ?Musculoskeletal: Negative.   ?Skin:  Negative for rash.  ?All other systems reviewed and are negative. ? ?  ?Objective:  ?  ?Physical Exam ?Vitals and nursing note reviewed.  ?Constitutional:   ?   Appearance: Normal appearance.  ?HENT:  ?   Head: Normocephalic.  ?   Right Ear: External ear normal.  ?   Left Ear: External ear normal.  ?   Mouth/Throat:  ?   Mouth: Mucous membranes are moist.  ?   Pharynx: Oropharynx is clear.  ?Eyes:  ?   Conjunctiva/sclera: Conjunctivae normal.  ?Cardiovascular:  ?   Rate and Rhythm: Normal rate and regular rhythm.  ?   Pulses: Normal pulses.  ?   Heart sounds: Normal heart sounds.  ?Pulmonary:  ?   Effort: Pulmonary effort is normal.  ?   Breath sounds: Normal breath sounds.  ?Abdominal:  ?   General: Bowel sounds are normal.  ?Skin: ?   General: Skin is warm.  ?   Findings: No rash.  ?Neurological:  ?   General: No focal deficit present.  ?   Mental Status: She is alert and oriented to person, place, and time.  ? ? ?BP 125/70   Pulse (!) 56   Temp 98.4 ?F (36.9 ?C)   Ht _0  (1.473 m)   Wt 148 lb (67.1 kg)   SpO2 98%   BMI 30.93 kg/m?  ?Wt Readings from Last 3 Encounters:  ?08/14/21 148 lb (67.1 kg)  ?06/16/21 142 lb 6 oz (64.6 kg)  ?05/15/21 148 lb (67.1 kg)  ? ? ? ?Health Maintenance Due  ?Topic Date Due  ? COLONOSCOPY (Pts 45-55yr Insurance coverage will need to be confirmed)  02/02/2021  ? ? ?There are no  preventive care reminders to display for this patient. ? ?No results found for: TSH ?Lab Results  ?Component Value Date  ? WBC 4.9 05/15/2021  ? HGB 12.6 05/15/2021  ? HCT 38.1 05/15/2021  ? MCV 84 05/15/2021  ? PLT 256 05/15/2021  ? ?Lab Results  ?Component Value Date  ? NA 142 05/15/2021  ? K 3.9 05/15/2021  ? CO2 24 05/15/2021  ? GLUCOSE  81 05/15/2021  ? BUN 9 05/15/2021  ? CREATININE 0.93 05/15/2021  ? BILITOT 0.6 05/15/2021  ? ALKPHOS 68 05/15/2021  ? AST 19 05/15/2021  ? ALT 17 05/15/2021  ? PROT 6.9 05/15/2021  ? ALBUMIN 4.5 05/15/2021  ? CALCIUM 10.2 05/15/2021  ? EGFR 65 05/15/2021  ? ?Lab Results  ?Component Value Date  ? CHOL 205 (H) 05/15/2021  ? ?Lab Results  ?Component Value Date  ? HDL 66 05/15/2021  ? ?Lab Results  ?Component Value Date  ? LDLCALC 122 (H) 05/15/2021  ? ?Lab Results  ?Component Value Date  ? TRIG 96 05/15/2021  ? ?Lab Results  ?Component Value Date  ? CHOLHDL 3.1 05/15/2021  ? ?No results found for: HGBA1C ? ?  ?Assessment & Plan:  ? ?Problem List Items Addressed This Visit   ? ?  ? Cardiovascular and Mediastinum  ? HTN (hypertension) - Primary  ?  Blood pressure well controlled on current medication. No changes necessary.  Completed labs CBC CMP lipid panel.  Follow-up in 3 months. ?  ?  ? Relevant Orders  ? CBC with Differential  ? Comprehensive metabolic panel  ?  ? Other  ? Hyperlipidemia  ?  No new signs and symptoms of hyper hyperlipidemia.  Completed lipid panel results pending.  Continue low-cholesterol diet, exercise as tolerated. ?  ?  ? Relevant Orders  ? Lipid Panel  ? Post menopausal syndrome  ?  Education provided to patient that it is safe for her to gradually discontinue estradiol as she has been on hormone therapy for a long time. ?  ?  ? ? ?No orders of the defined types were placed in this encounter. ? ? ?Follow-up: Return in about 3 months (around 11/14/2021) for Chronic disease management.  ? ? ?Ivy Lynn, NP ?

## 2021-08-15 LAB — CBC WITH DIFFERENTIAL/PLATELET
Basophils Absolute: 0 10*3/uL (ref 0.0–0.2)
Basos: 0 %
EOS (ABSOLUTE): 0.1 10*3/uL (ref 0.0–0.4)
Eos: 2 %
Hematocrit: 40.3 % (ref 34.0–46.6)
Hemoglobin: 13.3 g/dL (ref 11.1–15.9)
Immature Grans (Abs): 0 10*3/uL (ref 0.0–0.1)
Immature Granulocytes: 0 %
Lymphocytes Absolute: 1.8 10*3/uL (ref 0.7–3.1)
Lymphs: 35 %
MCH: 27.9 pg (ref 26.6–33.0)
MCHC: 33 g/dL (ref 31.5–35.7)
MCV: 85 fL (ref 79–97)
Monocytes Absolute: 0.5 10*3/uL (ref 0.1–0.9)
Monocytes: 10 %
Neutrophils Absolute: 2.7 10*3/uL (ref 1.4–7.0)
Neutrophils: 53 %
Platelets: 271 10*3/uL (ref 150–450)
RBC: 4.76 x10E6/uL (ref 3.77–5.28)
RDW: 13 % (ref 11.7–15.4)
WBC: 5.1 10*3/uL (ref 3.4–10.8)

## 2021-08-15 LAB — COMPREHENSIVE METABOLIC PANEL
ALT: 18 IU/L (ref 0–32)
AST: 27 IU/L (ref 0–40)
Albumin/Globulin Ratio: 1.9 (ref 1.2–2.2)
Albumin: 4.8 g/dL — ABNORMAL HIGH (ref 3.7–4.7)
Alkaline Phosphatase: 74 IU/L (ref 44–121)
BUN/Creatinine Ratio: 11 — ABNORMAL LOW (ref 12–28)
BUN: 10 mg/dL (ref 8–27)
Bilirubin Total: 0.7 mg/dL (ref 0.0–1.2)
CO2: 24 mmol/L (ref 20–29)
Calcium: 10 mg/dL (ref 8.7–10.3)
Chloride: 101 mmol/L (ref 96–106)
Creatinine, Ser: 0.94 mg/dL (ref 0.57–1.00)
Globulin, Total: 2.5 g/dL (ref 1.5–4.5)
Glucose: 88 mg/dL (ref 70–99)
Potassium: 4.4 mmol/L (ref 3.5–5.2)
Sodium: 139 mmol/L (ref 134–144)
Total Protein: 7.3 g/dL (ref 6.0–8.5)
eGFR: 64 mL/min/{1.73_m2} (ref >59)

## 2021-08-15 LAB — LIPID PANEL
Chol/HDL Ratio: 2.7 ratio (ref 0.0–4.4)
Cholesterol, Total: 203 mg/dL — ABNORMAL HIGH (ref 100–199)
HDL: 74 mg/dL (ref >39)
LDL Chol Calc (NIH): 111 mg/dL — ABNORMAL HIGH (ref 0–99)
Triglycerides: 104 mg/dL (ref 0–149)
VLDL Cholesterol Cal: 18 mg/dL (ref 5–40)

## 2021-09-07 ENCOUNTER — Telehealth: Payer: Self-pay | Admitting: Nurse Practitioner

## 2021-09-07 DIAGNOSIS — E7800 Pure hypercholesterolemia, unspecified: Secondary | ICD-10-CM

## 2021-09-07 DIAGNOSIS — I10 Essential (primary) hypertension: Secondary | ICD-10-CM

## 2021-09-07 MED ORDER — HYDROCHLOROTHIAZIDE 12.5 MG PO TABS: 12.5000 mg | ORAL_TABLET | Freq: Every day | ORAL | 1 refills | Status: DC

## 2021-09-07 MED ORDER — LISINOPRIL 40 MG PO TABS: 40.0000 mg | ORAL_TABLET | Freq: Every day | ORAL | 1 refills | Status: DC

## 2021-09-07 MED ORDER — METOPROLOL TARTRATE 25 MG PO TABS: 12.5000 mg | ORAL_TABLET | Freq: Two times a day (BID) | ORAL | 1 refills | Status: DC

## 2021-09-07 MED ORDER — ESTRADIOL 0.5 MG PO TABS: 0.5000 mg | ORAL_TABLET | Freq: Every day | ORAL | 1 refills | Status: DC

## 2021-09-07 MED ORDER — ATORVASTATIN CALCIUM 40 MG PO TABS: 40.0000 mg | ORAL_TABLET | Freq: Every day | ORAL | 1 refills | Status: DC

## 2021-09-07 NOTE — Telephone Encounter (Signed)
All sent for 90 day and 1 RF - pt aware  ?

## 2021-09-07 NOTE — Telephone Encounter (Signed)
Pt says her insurance changed and she now has [facility] so she needs all of her Rx's sent to [facility] order pharmacy asap.  ?

## 2021-09-11 ENCOUNTER — Other Ambulatory Visit: Payer: Self-pay | Admitting: Nurse Practitioner

## 2021-09-11 DIAGNOSIS — I10 Essential (primary) hypertension: Secondary | ICD-10-CM

## 2021-09-15 ENCOUNTER — Telehealth: Payer: Self-pay | Admitting: Nurse Practitioner

## 2021-09-16 ENCOUNTER — Other Ambulatory Visit: Payer: Self-pay | Admitting: Nurse Practitioner

## 2021-09-16 DIAGNOSIS — N951 Menopausal and female climacteric states: Secondary | ICD-10-CM

## 2021-09-16 NOTE — Telephone Encounter (Signed)
Have patient come in to draw some hormone panel labs and I will refer her to GYN. Thank you ?

## 2021-09-29 ENCOUNTER — Ambulatory Visit: Payer: Medicare PPO | Admitting: Obstetrics and Gynecology

## 2021-09-29 ENCOUNTER — Encounter: Payer: Self-pay | Admitting: Obstetrics and Gynecology

## 2021-09-29 VITALS — BP 142/86 | HR 71 | Ht 59.0 in | Wt 150.0 lb

## 2021-09-29 DIAGNOSIS — R58 Hemorrhage, not elsewhere classified: Secondary | ICD-10-CM

## 2021-09-29 DIAGNOSIS — Z79899 Other long term (current) drug therapy: Secondary | ICD-10-CM | POA: Diagnosis not present

## 2021-09-29 DIAGNOSIS — N951 Menopausal and female climacteric states: Secondary | ICD-10-CM

## 2021-09-29 LAB — URINALYSIS W MICROSCOPIC + REFLEX CULTURE
Bilirubin Urine: NEGATIVE
Glucose, UA: NEGATIVE
Hyaline Cast: NONE SEEN /LPF
Ketones, ur: NEGATIVE
Leukocyte Esterase: NEGATIVE
Nitrites, Initial: NEGATIVE
Protein, ur: NEGATIVE
RBC / HPF: NONE SEEN /HPF (ref 0–2)
Specific Gravity, Urine: 1.01 (ref 1.001–1.035)
pH: 6.5 (ref 5.0–8.0)

## 2021-09-29 LAB — NO CULTURE INDICATED

## 2021-09-29 MED ORDER — ESTRADIOL 0.0375 MG/24HR TD PTTW: 1.0000 | MEDICATED_PATCH | TRANSDERMAL | 2 refills | Status: DC

## 2021-09-29 NOTE — Patient Instructions (Signed)
Estradiol Skin Patches ?What is this medication? ?ESTRADIOL (es tra DYE ole) reduces the number and severity of hot flashes due to menopause. It may also help relieve the symptoms of menopause, such as vaginal irritation, dryness, or pain during sex. It can also be used to prevent osteoporosis after menopause. It works by increasing levels of the hormone estrogen in the body. This medication is an estrogen hormone. ?This medicine may be used for other purposes; ask your health care provider or pharmacist if you have questions. ?COMMON BRAND NAME(S): Alora, Climara, DOTTI, Esclim, Estraderm, FemPatch, LYLLANA, Menostar, Minivelle, Vivelle, Vivelle-Dot ?What should I tell my care team before I take this medication? ?They need to know if you have any of these conditions: ?Abnormal vaginal bleeding ?Blood vessel disease or blood clots ?Breast, cervical, endometrial, ovarian, liver, or uterine cancer ?Dementia ?Diabetes (high blood sugar) ?Gallbladder disease ?Heart disease or recent heart attack ?High blood pressure ?High cholesterol ?High levels of calcium in the blood ?Hysterectomy ?Kidney disease ?Liver disease ?Low thyroid levels ?Lupus ?Migraine headaches ?Protein C/S deficiency ?Smoke tobacco cigarettes ?Stroke ?An unusual or allergic reaction to estrogens, other medications, foods, dyes, or preservatives ?Pregnant or trying to get pregnant ?Breast-feeding ?How should I use this medication? ?This medication is for external use only. Use it as directed on the prescription label. Apply the patch, sticky side to the skin, to an area that is clean, dry and hairless. Do not cut or trim the patch. Do not wear more than 1 patch at a time. Remove the old patch before using a new patch. Use a different site each time to prevent skin irritation. Keep using it unless your care team tells you to stop. ?This medication comes with INSTRUCTIONS FOR USE. Ask your pharmacist for directions on how to use this medication. Read the  information carefully. Talk to your pharmacist or care team if you have questions. ?A patient package insert for the product will be given with each prescription and refill. Be sure to read this information carefully each time. ?Talk to your care team about the use of this medication in children. Special care may be needed. ?Overdosage: If you think you have taken too much of this medicine contact a poison control center or emergency room at once. ?NOTE: This medicine is only for you. Do not share this medicine with others. ?What if I miss a dose? ?If you miss a dose, apply it as soon as you can. If it is almost time for your next dose, apply only that dose. Do not apply double or extra doses. ?What may interact with this medication? ?Do not take this medication with any of the following: ?Aromatase inhibitors like aminoglutethimide, anastrozole, exemestane, letrozole, testolactone ?This medication may also interact with the following: ?Carbamazepine ?Certain antibiotics like erythromycin or clarithromycin ?Certain antiviral medications for HIV or hepatitis ?Certain medications for fungal infections like ketoconazole, itraconazole, or posaconazole ?Medications for fungus infections like itraconazole and ketoconazole ?Phenobarbital ?Raloxifene ?Rifampin ?St. John's Wort ?Tamoxifen ?This list may not describe all possible interactions. Give your health care provider a list of all the medicines, herbs, non-prescription drugs, or dietary supplements you use. Also tell them if you smoke, drink alcohol, or use illegal drugs. Some items may interact with your medicine. ?What should I watch for while using this medication? ?Visit your care team for regular checks on your progress. You will need a regular breast and pelvic exam and Pap smear while on this medication. You should also discuss the need for regular  mammograms with your care team, and follow his or her guidelines for these tests. ?This medication can make your  body retain fluid, making your fingers, hands, or ankles swell. Your blood pressure can go up. Contact your care team if you feel you are retaining fluid. ?If you have any reason to think you are pregnant, stop taking this medication right away and contact your care team. ?Smoking increases the risk of getting a blood clot or having a stroke while you are taking this medication, especially if you are more than 74 years old. You are strongly advised not to smoke. ?If you wear contact lenses and notice visual changes, or if the lenses begin to feel uncomfortable, consult your eye care specialist. ?This medication can increase the risk of developing a condition (endometrial hyperplasia) that may lead to cancer of the lining of the uterus. Taking progestins, another hormone medication, with this medication lowers the risk of developing this condition. Therefore, if your uterus has not been removed (by a hysterectomy), your care team may prescribe a progestin for you to take together with your estrogen. You should know, however, that taking estrogens with progestins may have additional health risks. You should discuss the use of estrogens and progestins with your care team to determine the benefits and risks for you. ?If you are going to need surgery, an MRI, CT scan, or other procedure, tell your care team that you are using this medication. You may need to remove the patch before the procedure. ?Contact with water while you are swimming, using a sauna, bathing, or showering may cause the patch to fall off. If your patch falls off reapply it. If you cannot reapply the patch, apply a new patch to another area and continue to follow your usual dose schedule. ?What side effects may I notice from receiving this medication? ?Side effects that you should report to your care team as soon as possible: ?Allergic reactions--skin rash, itching, hives, swelling of the face, lips, tongue, or throat ?Blood clot--pain, swelling, or  warmth in the leg, shortness of breath, chest pain ?Breast tissue changes, new lumps, redness, pain, or discharge from the nipple ?Gallbladder problems--severe stomach pain, nausea, vomiting, fever ?Increase in blood pressure ?Liver injury--right upper belly pain, loss of appetite, nausea, light-colored stool, dark yellow or brown urine, yellowing skin or eyes, unusual weakness or fatigue ?Stroke--sudden numbness or weakness of the face, arm, or leg, trouble speaking, confusion, trouble walking, loss of balance or coordination, dizziness, severe headache, change in vision ?Unusual vaginal discharge, itching, or odor ?Vaginal bleeding after menopause, pelvic pain ?Side effects that usually do not require medical attention (report to your care team if they continue or are bothersome): ?Bloating ?Breast pain or tenderness ?Hair loss ?Nausea ?Stomach pain ?Vomiting ?This list may not describe all possible side effects. Call your doctor for medical advice about side effects. You may report side effects to FDA at 1-800-FDA-1088. ?Where should I keep my medication? ?Keep out of the reach of children and pets. ?Store at room temperature between 20 and 25 degrees C (68 and 77 degrees F). Keep this medication in the original pouch until you are ready to use it. Get rid of any unused medication after the expiration date. ?Get rid of used patches properly. Since used patches may still contain active medication, fold the patch in half so that it sticks to itself before throwing it away. Put it in the trash where children and pets cannot reach it. ?It is important to get  rid of the medication as soon as you no longer need it, or it is expired. You can do this in two ways: ?Take the medication to a medication take-back program. Check with your pharmacy or law enforcement to find a location. ?If you cannot return the medication, ask your pharmacist or care team how to get rid of this medication safely. ?NOTE: This sheet is a  summary. It may not cover all possible information. If you have questions about this medicine, talk to your doctor, pharmacist, or health care provider. ?? 2023 Elsevier/Gold Standard (2020-06-27 00:00:00) ? ?

## 2021-09-29 NOTE — Progress Notes (Signed)
GYNECOLOGY  VISIT ?  ?HPI: ?76 y.o.   Married  [place]  female   ?[id number] with No LMP recorded. Patient has had a hysterectomy.   ?here for hot flashes at night. She is having some vaginal spotting every 3-4 days. ?Her bleeding is going on for one or more years.  ?Not sexually active for 3 years.  ? ?Hysterectomy in [place], [place] about 38 years ago.  ?She is uncertain if her ovaries were removed. ?She has been taking the estrogen since her hysterectomy.  ? ?Having hot flashes at night.  ?Patient is trying to wean off her estrogen since August 14, 2021.  ?She was told to take 1/2 dose of her estrogen and then take 1/2 dose every other day.  ?She ultimately went back on her estradiol 0.5 mg daily and the hot flashes continue.  ? ?She wants to stop taking the estrogen pill and switch to a patch.  ? ?GYNECOLOGIC HISTORY: ?No LMP recorded. Patient has had a hysterectomy. ?Contraception:  Hyst ?Menopausal hormone therapy:  Estradiol 0.5mg  ?Last mammogram:  06-04-21 Neg/Birads1 ?Last pap smear:  Years ago -- Hx of abnormal pap. Per patient had hyst due to abnormal pap smear. ?       ?OB History   ? ? Gravida  ?5  ? Para  ?4  ? Term  ?   ? Preterm  ?   ? AB  ?1  ? Living  ?4  ?  ? ? SAB  ?1  ? IAB  ?   ? Ectopic  ?   ? Multiple  ?   ? Live Births  ?   ?   ?  ?  ?    ? ?Patient Active Problem List  ? Diagnosis Date Noted  ? Right leg pain 09/03/2020  ? Benign head tremor 11/14/2018  ? Stroke-like symptoms 06/17/2014  ? Degeneration of cervical intervertebral disc 06/12/2014  ? Neck pain 06/12/2014  ? Annual physical exam 10/12/2013  ? Insomnia 10/12/2013  ? Carpal tunnel syndrome 10/26/2012  ? Overweight 09/18/2012  ? OA (osteoarthritis) of knee 03/09/2012  ? Headache 03/09/2012  ? Post menopausal syndrome 03/09/2012  ? HTN (hypertension) 10/07/2011  ? Hyperlipidemia 10/07/2011  ? Internal hemorrhoids 10/07/2011  ? ? ?Past Medical History:  ?Diagnosis Date  ? Allergy   ? Arthritis   ? left knee  ? History of abnormal  cervical Pap smear   ? Had Hyst due to abnormal paps per patient  ? Hyperlipidemia   ? Hypertension   ? ? ?Past Surgical History:  ?Procedure Laterality Date  ? ABDOMINAL HYSTERECTOMY    ? KNEE SURGERY    ? left  ? ? ?Current Outpatient Medications  ?Medication Sig Dispense Refill  ? atorvastatin (LIPITOR) 40 MG tablet Take 1 tablet (40 mg total) by mouth daily. 90 tablet 1  ? estradiol (ESTRACE) 0.5 MG tablet Take 1 tablet (0.5 mg total) by mouth daily. 90 tablet 1  ? hydrochlorothiazide (HYDRODIURIL) 12.5 MG tablet Take 1 tablet (12.5 mg total) by mouth daily. 90 tablet 1  ? lisinopril (ZESTRIL) 40 MG tablet Take 1 tablet (40 mg total) by mouth daily. 90 tablet 1  ? metoprolol tartrate (LOPRESSOR) 25 MG tablet Take 0.5 tablets (12.5 mg total) by mouth 2 (two) times daily. 180 tablet 1  ? Saccharomyces boulardii (PROBIOTIC) 250 MG CAPS Take by mouth.    ? ?No current facility-administered medications for this visit.  ?  ? ?ALLERGIES: Patient has no known  allergies. ? ?Family History  ?Problem Relation Age of Onset  ? Heart disease Mother   ? Kidney disease Father   ? Breast cancer Maternal Aunt   ? Colon cancer Neg Hx   ? Esophageal cancer Neg Hx   ? Stomach cancer Neg Hx   ? ? ?Social History  ? ?Socioeconomic History  ? Marital status: Married  ?  Spouse name: Remi DeterSamuel  ? Number of children: 4  ? Years of education: Not on file  ? Highest education level: Not on file  ?Occupational History  ? Occupation: Retired  ?Tobacco Use  ? Smoking status: Never  ? Smokeless tobacco: Never  ?Vaping Use  ? Vaping Use: Never used  ?Substance and Sexual Activity  ? Alcohol use: No  ?  Alcohol/week: 0.0 standard drinks  ? Drug use: No  ? Sexual activity: Not Currently  ?  Birth control/protection: None  ?  Comment: first intercourse <18  ?Other Topics Concern  ? Not on file  ?Social History Narrative  ? Married x 57 years in 2022.  ? 11 grandchildren  ? 5 great grandchildren.  ? ?Social Determinants of Health  ? ?Financial  Resource Strain: Low Risk   ? Difficulty of Paying Living Expenses: Not hard at all  ?Food Insecurity: No Food Insecurity  ? Worried About Programme researcher, broadcasting/film/videounning Out of Food in the Last Year: Never true  ? Ran Out of Food in the Last Year: Never true  ?Transportation Needs: No Transportation Needs  ? Lack of Transportation (Medical): No  ? Lack of Transportation (Non-Medical): No  ?Physical Activity: Insufficiently Active  ? Days of Exercise per Week: 4 days  ? Minutes of Exercise per Session: 30 min  ?Stress: No Stress Concern Present  ? Feeling of Stress : Not at all  ?Social Connections: Socially Integrated  ? Frequency of Communication with Friends and Family: More than three times a week  ? Frequency of Social Gatherings with Friends and Family: More than three times a week  ? Attends Religious Services: More than 4 times per year  ? Active Member of Clubs or Organizations: Yes  ? Attends BankerClub or Organization Meetings: More than 4 times per year  ? Marital Status: Married  ?Intimate Partner Violence: Not At Risk  ? Fear of Current or Ex-Partner: No  ? Emotionally Abused: No  ? Physically Abused: No  ? Sexually Abused: No  ? ? ?Review of Systems  ?All other systems reviewed and are negative. ? ?PHYSICAL EXAMINATION:   ? ?BP (!) 142/86   Pulse 71   Ht 4\' 11"  (1.499 m)   Wt 150 lb (68 kg)   SpO2 98%   BMI 30.30 kg/m?     ?General appearance: alert, cooperative and appears stated age ?Head: Normocephalic, without obvious abnormality, atraumatic ?Neck: no adenopathy, supple, symmetrical, trachea midline and thyroid normal to inspection and palpation ?Lungs: clear to auscultation bilaterally ?Heart: regular rate and rhythm ?Abdomen: soft, non-tender, no masses,  no organomegaly ?Extremities: extremities normal, atraumatic, no cyanosis or edema ?Skin: Skin color, texture, turgor normal. No rashes or lesions ?No abnormal inguinal nodes palpated ?Neurologic: Grossly normal ? ?Pelvic: External genitalia:  no lesions ?              Urethra:  normal appearing urethra with no masses, tenderness or lesions ?             Bartholins and Skenes: normal    ?  Vagina: 1 mm area of erythema of vaginal just inside the introitus on anterior vaginal wall. (I do not think this is the source of bleeding.) ?             Cervix:  absent ?               ?Bimanual Exam:  Uterus:  absent ?             Adnexa: no mass, fullness, tenderness ?             Rectal exam: yes.  Confirms. ?             Anus:  normal sphincter tone, no lesions ? ?Chaperone was present for exam:  Marchelle Folks, CMA ? ?ASSESSMENT ? ?Menopausal symptoms.  ?Current use of ERT.  ?Status post total abdominal hysterectomy.  ?Bleeding from unknown site. ? ?PLAN ? ?Extensive discussion of estrogen therapy, risks and benefits. ?Risk of stroke, DVT, PE, and possible breast cancer reviewed.  ?Stop oral estrogen.  ?Start Vivelle Dot 0.0375 mg twice daily.   ?We talked about weaning off of her estrogen slowly.  ?Urinalysis:  sg 1.010, ph 6.5, 0 - 5 WBC, NS RBC, 0 - 5 squams, few bacteria  ?UC sent.  ?I recommend she complete a colonoscopy.  She is working on this with her PCP.  ?Fu here in one month.  ?  ?An After Visit Summary was printed and given to the patient. ? ?56 in min  total time was spent for this patient encounter, including preparation, face-to-face counseling with the patient, coordination of care, and documentation of the encounter. ? ? ?

## 2021-10-02 ENCOUNTER — Other Ambulatory Visit: Payer: Medicare PPO

## 2021-10-02 ENCOUNTER — Telehealth: Payer: Self-pay | Admitting: Obstetrics and Gynecology

## 2021-10-02 ENCOUNTER — Other Ambulatory Visit: Payer: Self-pay | Admitting: *Deleted

## 2021-10-02 DIAGNOSIS — R8271 Bacteriuria: Secondary | ICD-10-CM | POA: Diagnosis not present

## 2021-10-02 NOTE — Telephone Encounter (Signed)
FYI. Pt reported that she is not currently experiencing any dysuria or urgency to go void but is still going to come today at 1:30 to leave urine sample.  ?

## 2021-10-02 NOTE — Telephone Encounter (Signed)
Thank you for the update.  Encounter reviewed and closed.  

## 2021-10-02 NOTE — Telephone Encounter (Signed)
Please contact patient to recommend she return for a lab visit for a potential urine culture.  ?See below. ? ?Her urinalysis showed a tiny amount of bacteria, but for some reason a urine culture was not processed.  ? ?If she is having any urinary symptoms of dysuria or urgency, I would recommend she return to have just a urine culture performed.   Please order the lab in this fashion if she does return. ?

## 2021-10-03 LAB — URINE CULTURE
MICRO NUMBER:: 13295991
SPECIMEN QUALITY:: ADEQUATE

## 2021-10-19 ENCOUNTER — Encounter: Payer: Self-pay | Admitting: Obstetrics and Gynecology

## 2021-10-19 ENCOUNTER — Ambulatory Visit: Payer: Medicare PPO | Admitting: Obstetrics and Gynecology

## 2021-10-19 VITALS — BP 132/60 | Ht 59.0 in | Wt 160.0 lb

## 2021-10-19 DIAGNOSIS — Z79899 Other long term (current) drug therapy: Secondary | ICD-10-CM

## 2021-10-19 DIAGNOSIS — N951 Menopausal and female climacteric states: Secondary | ICD-10-CM

## 2021-10-19 MED ORDER — ESTRADIOL 0.0375 MG/24HR TD PTTW: 1.0000 | MEDICATED_PATCH | TRANSDERMAL | 1 refills | Status: DC

## 2021-10-19 NOTE — Progress Notes (Signed)
GYNECOLOGY  VISIT ?  ?HPI: ?76 y.o.   Married  [place]  female   ?[id number] with No LMP recorded. Patient has had a hysterectomy.   ?here for follow up.  Patient hasn't seen anymore bleeding. ? ?Patient is weaning off her estrogen, and she was switched from an oral to a transdermal estrogen, Vivelle Dot 0.0375 mg twice weekly, at her visit on 09/29/21.  ? ?Still having hot flashes.  ?She was having hot flashes on the estrogen pill and still does using the estrogen transdermal patch.  ? ?She does wake up at night, but she is able to function during the day.  ?She may take a nap if needed. ? ?No problems with the application of the patch to her skin.  ?No adhesive reaction issues.  ? ?GYNECOLOGIC HISTORY: ?No LMP recorded. Patient has had a hysterectomy. ?Contraception:  Hyst ?Menopausal hormone therapy:  Vivelle Dot 0.0375mg  ?Last mammogram:  06-04-21 Neg/Birads1 ?Last pap smear:   Years ago -- Hx of abnormal pap. Per patient had hyst due to abnormal pap smear. ?       ?OB History   ? ? Gravida  ?5  ? Para  ?4  ? Term  ?   ? Preterm  ?   ? AB  ?1  ? Living  ?4  ?  ? ? SAB  ?1  ? IAB  ?   ? Ectopic  ?   ? Multiple  ?   ? Live Births  ?   ?   ?  ?  ?    ? ?Patient Active Problem List  ? Diagnosis Date Noted  ? Right leg pain 09/03/2020  ? Benign head tremor 11/14/2018  ? Stroke-like symptoms 06/17/2014  ? Degeneration of cervical intervertebral disc 06/12/2014  ? Neck pain 06/12/2014  ? Annual physical exam 10/12/2013  ? Insomnia 10/12/2013  ? Carpal tunnel syndrome 10/26/2012  ? Overweight 09/18/2012  ? OA (osteoarthritis) of knee 03/09/2012  ? Headache 03/09/2012  ? Post menopausal syndrome 03/09/2012  ? HTN (hypertension) 10/07/2011  ? Hyperlipidemia 10/07/2011  ? Internal hemorrhoids 10/07/2011  ? ? ?Past Medical History:  ?Diagnosis Date  ? Allergy   ? Arthritis   ? left knee  ? History of abnormal cervical Pap smear   ? Had Hyst due to abnormal paps per patient  ? Hyperlipidemia   ? Hypertension   ? ? ?Past  Surgical History:  ?Procedure Laterality Date  ? ABDOMINAL HYSTERECTOMY    ? KNEE SURGERY    ? left  ? ? ?Current Outpatient Medications  ?Medication Sig Dispense Refill  ? atorvastatin (LIPITOR) 40 MG tablet Take 1 tablet (40 mg total) by mouth daily. 90 tablet 1  ? estradiol (VIVELLE-DOT) 0.0375 MG/24HR Place 1 patch onto the skin 2 (two) times a week. 8 patch 2  ? lisinopril (ZESTRIL) 40 MG tablet Take 1 tablet (40 mg total) by mouth daily. 90 tablet 1  ? metoprolol tartrate (LOPRESSOR) 25 MG tablet Take 0.5 tablets (12.5 mg total) by mouth 2 (two) times daily. 180 tablet 1  ? Saccharomyces boulardii (PROBIOTIC) 250 MG CAPS Take by mouth.    ? ?No current facility-administered medications for this visit.  ?  ? ?ALLERGIES: Patient has no known allergies. ? ?Family History  ?Problem Relation Age of Onset  ? Heart disease Mother   ? Kidney disease Father   ? Breast cancer Maternal Aunt   ? Colon cancer Neg Hx   ? Esophageal cancer Neg  Hx   ? Stomach cancer Neg Hx   ? ? ?Social History  ? ?Socioeconomic History  ? Marital status: Married  ?  Spouse name: Remi DeterSamuel  ? Number of children: 4  ? Years of education: Not on file  ? Highest education level: Not on file  ?Occupational History  ? Occupation: Retired  ?Tobacco Use  ? Smoking status: Never  ? Smokeless tobacco: Never  ?Vaping Use  ? Vaping Use: Never used  ?Substance and Sexual Activity  ? Alcohol use: No  ?  Alcohol/week: 0.0 standard drinks  ? Drug use: No  ? Sexual activity: Not Currently  ?  Birth control/protection: None  ?  Comment: first intercourse <18  ?Other Topics Concern  ? Not on file  ?Social History Narrative  ? Married x 57 years in 2022.  ? 11 grandchildren  ? 5 great grandchildren.  ? ?Social Determinants of Health  ? ?Financial Resource Strain: Low Risk   ? Difficulty of Paying Living Expenses: Not hard at all  ?Food Insecurity: No Food Insecurity  ? Worried About Programme researcher, broadcasting/film/videounning Out of Food in the Last Year: Never true  ? Ran Out of Food in the Last  Year: Never true  ?Transportation Needs: No Transportation Needs  ? Lack of Transportation (Medical): No  ? Lack of Transportation (Non-Medical): No  ?Physical Activity: Insufficiently Active  ? Days of Exercise per Week: 4 days  ? Minutes of Exercise per Session: 30 min  ?Stress: No Stress Concern Present  ? Feeling of Stress : Not at all  ?Social Connections: Socially Integrated  ? Frequency of Communication with Friends and Family: More than three times a week  ? Frequency of Social Gatherings with Friends and Family: More than three times a week  ? Attends Religious Services: More than 4 times per year  ? Active Member of Clubs or Organizations: Yes  ? Attends BankerClub or Organization Meetings: More than 4 times per year  ? Marital Status: Married  ?Intimate Partner Violence: Not At Risk  ? Fear of Current or Ex-Partner: No  ? Emotionally Abused: No  ? Physically Abused: No  ? Sexually Abused: No  ? ? ?Review of Systems  ?All other systems reviewed and are negative. ? ?PHYSICAL EXAMINATION:   ? ?BP 132/60   Ht 4\' 11"  (1.499 m)   Wt 160 lb (72.6 kg)   BMI 32.32 kg/m?     ?General appearance: alert, cooperative and appears stated age ? ?Pelvic: External genitalia:  no lesions ?             Urethra:  normal appearing urethra with no masses, tenderness or lesions ?             Bartholins and Skenes: normal    ?             Vagina: normal appearing vagina with normal color and discharge, 1 mm area of erythema to mucosa of the left anterior vaginal mucosa just inside the introitus.  No change. ?             Cervix: absent ?               ?Bimanual Exam:  Uterus:  absent ?             Adnexa: no mass, fullness, tenderness ?             ?Chaperone was present for exam:  Marchelle Folksmanda, CMA ? ?ASSESSMENT ? ?Menopausal symptoms.  ?Estrogen therapy.  ? ?  PLAN ? ?Goal is to slowly wean off estrogen.  ?Continue transdermal ERT.  ?Refill of Vivelle Dot 0.0375 mg twice weekly.  #12, RF one.  ?FU in 5 - 6 months.  ?  ?An After Visit  Summary was printed and given to the patient. ? ?26 min  total time was spent for this patient encounter, including preparation, face-to-face counseling with the patient, coordination of care, and documentation of the encounter. ? ? ?

## 2021-10-21 ENCOUNTER — Ambulatory Visit: Payer: Medicare PPO | Admitting: Obstetrics and Gynecology

## 2021-11-16 ENCOUNTER — Ambulatory Visit: Payer: Medicare PPO | Admitting: Nurse Practitioner

## 2021-11-16 ENCOUNTER — Encounter: Payer: Self-pay | Admitting: Nurse Practitioner

## 2021-11-16 VITALS — BP 136/72 | HR 60 | Temp 98.8°F | Ht <= 58 in | Wt 150.0 lb

## 2021-11-16 DIAGNOSIS — E7800 Pure hypercholesterolemia, unspecified: Secondary | ICD-10-CM

## 2021-11-16 DIAGNOSIS — E782 Mixed hyperlipidemia: Secondary | ICD-10-CM | POA: Diagnosis not present

## 2021-11-16 DIAGNOSIS — I10 Essential (primary) hypertension: Secondary | ICD-10-CM

## 2021-11-16 DIAGNOSIS — Z1211 Encounter for screening for malignant neoplasm of colon: Secondary | ICD-10-CM

## 2021-11-16 MED ORDER — LISINOPRIL 40 MG PO TABS: 40.0000 mg | ORAL_TABLET | Freq: Every day | ORAL | 1 refills | Status: DC

## 2021-11-16 MED ORDER — ATORVASTATIN CALCIUM 40 MG PO TABS: 40.0000 mg | ORAL_TABLET | Freq: Every day | ORAL | 1 refills | Status: DC

## 2021-11-16 MED ORDER — METOPROLOL TARTRATE 25 MG PO TABS: 12.5000 mg | ORAL_TABLET | Freq: Two times a day (BID) | ORAL | 1 refills | Status: DC

## 2021-11-16 NOTE — Assessment & Plan Note (Signed)
Hypertension well-controlled on current medication patient will reduce metoprolol from 25 mg tablet by mouth twice daily to 25 mg tablet by mouth daily.  She will continue lisinopril 40 mg tablet by mouth daily, blood pressure log daily for several weeks to monitor changes in medication..  Patient will follow-up as scheduled.

## 2021-11-16 NOTE — Assessment & Plan Note (Signed)
Completed labs lipid panel results pending.  Continue on current medication atorvastatin 40 mg tablet by mouth daily.

## 2021-11-16 NOTE — Progress Notes (Signed)
Established Patient Office Visit  Subjective   Patient ID: Mary Wilkinson. Connolly, female    DOB: 1946-06-05  Age: 76 y.o. MRN: [id number]  Chief Complaint  Patient presents with   Follow-up    3 month    HPI   Patient presents for follow up of hypertension. Patient was diagnosed in 10/07/2011 the patient is tolerating the medication well without side effects. Compliance with treatment has been good; including taking medication as directed , maintains a healthy diet and regular exercise regimen , and following up as directed.     Mixed hyperlipidemia  Pt presents with hyperlipidemia. Patient was diagnosed in 10/07/2011 compliance with treatment has been good; The patient is compliant with medications, maintains a low cholesterol diet , follows up as directed , and maintains an exercise regimen . The patient denies experiencing any hypercholesterolemia related symptoms.     Patient Active Problem List   Diagnosis Date Noted   Right leg pain 09/03/2020   Benign head tremor 11/14/2018   Stroke-like symptoms 06/17/2014   Degeneration of cervical intervertebral disc 06/12/2014   Neck pain 06/12/2014   Annual physical exam 10/12/2013   Insomnia 10/12/2013   Carpal tunnel syndrome 10/26/2012   Overweight 09/18/2012   OA (osteoarthritis) of knee 03/09/2012   Headache 03/09/2012   Post menopausal syndrome 03/09/2012   HTN (hypertension) 10/07/2011   Hyperlipidemia 10/07/2011   Internal hemorrhoids 10/07/2011   Past Medical History:  Diagnosis Date   Allergy    Arthritis    left knee   History of abnormal cervical Pap smear    Had Hyst due to abnormal paps per patient   Hyperlipidemia    Hypertension    Past Surgical History:  Procedure Laterality Date   ABDOMINAL HYSTERECTOMY     KNEE SURGERY     left   Social History   Tobacco Use   Smoking status: Never   Smokeless tobacco: Never  Vaping Use   Vaping Use: Never used  Substance Use Topics   Alcohol use: No     Alcohol/week: 0.0 standard drinks   Drug use: No   Social History   Socioeconomic History   Marital status: Married    Spouse name: Mikeal Hawthorne   Number of children: 4   Years of education: Not on file   Highest education level: Not on file  Occupational History   Occupation: Retired  Tobacco Use   Smoking status: Never   Smokeless tobacco: Never  Vaping Use   Vaping Use: Never used  Substance and Sexual Activity   Alcohol use: No    Alcohol/week: 0.0 standard drinks   Drug use: No   Sexual activity: Not Currently    Birth control/protection: None    Comment: first intercourse <18  Other Topics Concern   Not on file  Social History Narrative   Married x 57 years in 2022.   11 grandchildren   5 great grandchildren.   Social Determinants of Health   Financial Resource Strain: Low Risk    Difficulty of Paying Living Expenses: Not hard at all  Food Insecurity: No Food Insecurity   Worried About Charity fundraiser in the Last Year: Never true   [place] in the Last Year: Never true  Transportation Needs: No Transportation Needs   Lack of Transportation (Medical): No   Lack of Transportation (Non-Medical): No  Physical Activity: Insufficiently Active   Days of Exercise per Week: 4 days   Minutes of Exercise per  Session: 30 min  Stress: No Stress Concern Present   Feeling of Stress : Not at all  Social Connections: Socially Integrated   Frequency of Communication with Friends and Family: More than three times a week   Frequency of Social Gatherings with Friends and Family: More than three times a week   Attends Religious Services: More than 4 times per year   Active Member of [employer] or Organizations: Yes   Attends Music therapist: More than 4 times per year   Marital Status: Married  Human resources officer Violence: Not At Risk   Fear of Current or Ex-Partner: No   Emotionally Abused: No   Physically Abused: No   Sexually Abused: No   Family Status   Relation Name Status   Mother  Deceased   Father  Deceased   Sister  Alive   Sister  Alive   Sister  Alive   Sister  Deceased   Brother  Alive   Brother  Ecologist  (Not Specified)   MGM  Deceased   MGF  Deceased   PGM  Deceased   PGF  Deceased   Daughter 2 Alive   Son 2 Alive   Neg Hx  (Not Specified)   Family History  Problem Relation Age of Onset   Heart disease Mother    Kidney disease Father    Breast cancer Maternal Aunt    Colon cancer Neg Hx    Esophageal cancer Neg Hx    Stomach cancer Neg Hx    No Known Allergies      Review of Systems  Constitutional: Negative.   HENT: Negative.    Eyes: Negative.   Respiratory: Negative.    Cardiovascular: Negative.   Genitourinary: Negative.   Musculoskeletal: Negative.   Skin: Negative.  Negative for rash.  Neurological: Negative.   Psychiatric/Behavioral: Negative.    All other systems reviewed and are negative.    Objective:     BP 136/72   Pulse 60   Temp 98.8 F (37.1 C)   Ht _0  (1.448 m)   Wt 150 lb (68 kg)   SpO2 94%   BMI 32.46 kg/m  BP Readings from Last 3 Encounters:  11/16/21 136/72  10/19/21 132/60  09/29/21 (!) 142/86   Wt Readings from Last 3 Encounters:  11/16/21 150 lb (68 kg)  10/19/21 160 lb (72.6 kg)  09/29/21 150 lb (68 kg)      Physical Exam Vitals and nursing note reviewed.  Constitutional:      Appearance: Normal appearance.  HENT:     Head: Normocephalic.     Right Ear: Ear canal and external ear normal.     Left Ear: Ear canal and external ear normal.     Nose: Nose normal.     Mouth/Throat:     Mouth: Mucous membranes are moist.     Pharynx: Oropharynx is clear.  Eyes:     Conjunctiva/sclera: Conjunctivae normal.  Cardiovascular:     Rate and Rhythm: Normal rate and regular rhythm.     Pulses: Normal pulses.     Heart sounds: Normal heart sounds.  Pulmonary:     Effort: Pulmonary effort is normal.     Breath sounds: Normal breath sounds.   Abdominal:     General: Bowel sounds are normal.  Musculoskeletal:        General: Normal range of motion.  Skin:    General: Skin is warm.  Findings: No rash.  Neurological:     General: No focal deficit present.     Mental Status: She is alert and oriented to person, place, and time.  Psychiatric:        Mood and Affect: Mood normal.        Behavior: Behavior normal.     No results found for any visits on 11/16/21.  Last CBC Lab Results  Component Value Date   WBC 5.1 08/14/2021   HGB 13.3 08/14/2021   HCT 40.3 08/14/2021   MCV 85 08/14/2021   MCH 27.9 08/14/2021   RDW 13.0 08/14/2021   PLT 271 58/52/7782   Last metabolic panel Lab Results  Component Value Date   GLUCOSE 88 08/14/2021   NA 139 08/14/2021   K 4.4 08/14/2021   CL 101 08/14/2021   CO2 24 08/14/2021   BUN 10 08/14/2021   CREATININE 0.94 08/14/2021   EGFR 64 08/14/2021   CALCIUM 10.0 08/14/2021   PROT 7.3 08/14/2021   ALBUMIN 4.8 (H) 08/14/2021   LABGLOB 2.5 08/14/2021   AGRATIO 1.9 08/14/2021   BILITOT 0.7 08/14/2021   ALKPHOS 74 08/14/2021   AST 27 08/14/2021   ALT 18 08/14/2021   Last lipids Lab Results  Component Value Date   CHOL 203 (H) 08/14/2021   HDL 74 08/14/2021   LDLCALC 111 (H) 08/14/2021   TRIG 104 08/14/2021   CHOLHDL 2.7 08/14/2021   Last hemoglobin A1c No results found for: HGBA1C Last thyroid functions No results found for: TSH, T3TOTAL, T4TOTAL, THYROIDAB Last vitamin D Lab Results  Component Value Date   VD25OH 52 10/12/2013   Last vitamin B12 and Folate Lab Results  Component Value Date   UMPNTIRW43 154 03/09/2012      The 10-year ASCVD risk score (Arnett DK, et al., 2019) is: 17.7%    Assessment & Plan:   Problem List Items Addressed This Visit       Cardiovascular and Mediastinum   HTN (hypertension)    Hypertension well-controlled on current medication patient will reduce metoprolol from 25 mg tablet by mouth twice daily to 25 mg tablet by  mouth daily.  She will continue lisinopril 40 mg tablet by mouth daily, blood pressure log daily for several weeks to monitor changes in medication..  Patient will follow-up as scheduled.       Relevant Medications   atorvastatin (LIPITOR) 40 MG tablet   lisinopril (ZESTRIL) 40 MG tablet   metoprolol tartrate (LOPRESSOR) 25 MG tablet     Other   Hyperlipidemia - Primary    Completed labs lipid panel results pending.  Continue on current medication atorvastatin 40 mg tablet by mouth daily.       Relevant Medications   atorvastatin (LIPITOR) 40 MG tablet   lisinopril (ZESTRIL) 40 MG tablet   metoprolol tartrate (LOPRESSOR) 25 MG tablet   Other Visit Diagnoses     Essential hypertension       Relevant Medications   atorvastatin (LIPITOR) 40 MG tablet   lisinopril (ZESTRIL) 40 MG tablet   metoprolol tartrate (LOPRESSOR) 25 MG tablet   Other Relevant Orders   CBC with Differential   Lipid Panel   Colon cancer screening       Relevant Orders   Cologuard       Return in about 6 months (around 05/18/2022) for chronic disease management.    Ivy Lynn, NP

## 2021-11-16 NOTE — Patient Instructions (Addendum)
High Cholesterol  High cholesterol is a condition in which the blood has high levels of a white, waxy substance similar to fat (cholesterol). The liver makes all the cholesterol that the body needs. The human body needs small amounts of cholesterol to help build cells. A person gets extra or excess cholesterol from the food that he or she eats. The blood carries cholesterol from the liver to the rest of the body. If you have high cholesterol, deposits (plaques) may build up on the walls of your arteries. Arteries are the blood vessels that carry blood away from your heart. These plaques make the arteries narrow and stiff. Cholesterol plaques increase your risk for heart attack and stroke. Work with your health care provider to keep your cholesterol levels in a healthy range. What increases the risk? The following factors may make you more likely to develop this condition: Eating foods that are high in animal fat (saturated fat) or cholesterol. Being overweight. Not getting enough exercise. A family history of high cholesterol (familial hypercholesterolemia). Use of tobacco products. Having diabetes. What are the signs or symptoms? In most cases, high cholesterol does not usually cause any symptoms. In severe cases, very high cholesterol levels can cause: Fatty bumps under the skin (xanthomas). A white or gray ring around the black center (pupil) of the eye. How is this diagnosed? This condition may be diagnosed based on the results of a blood test. If you are older than 74 years of age, your health care provider may check your cholesterol levels every 4-6 years. You may be checked more often if you have high cholesterol or other risk factors for heart disease. The blood test for cholesterol measures: "Bad" cholesterol, or LDL cholesterol. This is the main type of cholesterol that causes heart disease. The desired level is less than 100 mg/dL (2.59 mmol/L). "Good" cholesterol, or HDL  cholesterol. HDL helps protect against heart disease by cleaning the arteries and carrying the LDL to the liver for processing. The desired level for HDL is 60 mg/dL (1.55 mmol/L) or higher. Triglycerides. These are fats that your body can store or burn for energy. The desired level is less than 150 mg/dL (1.69 mmol/L). Total cholesterol. This measures the total amount of cholesterol in your blood and includes LDL, HDL, and triglycerides. The desired level is less than 200 mg/dL (5.17 mmol/L). How is this treated? Treatment for high cholesterol starts with lifestyle changes, such as diet and exercise. Diet changes. You may be asked to eat foods that have more fiber and less saturated fats or added sugar. Lifestyle changes. These may include regular exercise, maintaining a healthy weight, and quitting use of tobacco products. Medicines. These are given when diet and lifestyle changes have not worked. You may be prescribed a statin medicine to help lower your cholesterol levels. Follow these instructions at home: Eating and drinking  Eat a healthy, balanced diet. This diet includes: Daily servings of a variety of fresh, frozen, or canned fruits and vegetables. Daily servings of whole grain foods that are rich in fiber. Foods that are low in saturated fats and trans fats. These include poultry and fish without skin, lean cuts of meat, and low-fat dairy products. A variety of fish, especially oily fish that contain omega-3 fatty acids. Aim to eat fish at least 2 times a week. Avoid foods and drinks that have added sugar. Use healthy cooking methods, such as roasting, grilling, broiling, baking, poaching, steaming, and stir-frying. Do not fry your food except for   stir-frying. If you drink alcohol: Limit how much you have to: 0-1 drink a day for women who are not pregnant. 0-2 drinks a day for men. Know how much alcohol is in a drink. In the [place] one drink equals one 12 oz bottle of beer (355 mL),  one 5 oz glass of wine (148 mL), or one 1 oz glass of hard liquor (44 mL). Lifestyle  Get regular exercise. Aim to exercise for a total of 150 minutes a week. Increase your activity level by doing activities such as gardening, walking, and taking the stairs. Do not use any products that contain nicotine or tobacco. These products include cigarettes, chewing tobacco, and vaping devices, such as e-cigarettes. If you need help quitting, ask your health care provider. General instructions Take over-the-counter and prescription medicines only as told by your health care provider. Keep all follow-up visits. This is important. Where to find more information American Heart Association: www.heart.org National Heart, Lung, and Blood Institute: www.nhlbi.nih.gov Contact a health care provider if: You have trouble achieving or maintaining a healthy diet or weight. You are starting an exercise program. You are unable to stop smoking. Get help right away if: You have chest pain. You have trouble breathing. You have discomfort or pain in your jaw, neck, back, shoulder, or arm. You have any symptoms of a stroke. "BE FAST" is an easy way to remember the main warning signs of a stroke: B - Balance. Signs are dizziness, sudden trouble walking, or loss of balance. E - Eyes. Signs are trouble seeing or a sudden change in vision. F - Face. Signs are sudden weakness or numbness of the face, or the face or eyelid drooping on one side. A - Arms. Signs are weakness or numbness in an arm. This happens suddenly and usually on one side of the body. S - Speech. Signs are sudden trouble speaking, slurred speech, or trouble understanding what people say. T - Time. Time to call emergency services. Write down what time symptoms started. You have other signs of a stroke, such as: A sudden, severe headache with no known cause. Nausea or vomiting. Seizure. These symptoms may represent a serious problem that is an  emergency. Do not wait to see if the symptoms will go away. Get medical help right away. Call your local emergency services (911 in the [place] Do not drive yourself to the hospital. Summary Cholesterol plaques increase your risk for heart attack and stroke. Work with your health care provider to keep your cholesterol levels in a healthy range. Eat a healthy, balanced diet, get regular exercise, and maintain a healthy weight. Do not use any products that contain nicotine or tobacco. These products include cigarettes, chewing tobacco, and vaping devices, such as e-cigarettes. Get help right away if you have any symptoms of a stroke. This information is not intended to replace advice given to you by your health care provider. Make sure you discuss any questions you have with your health care provider. Document Revised: 08/14/2020 Document Reviewed: 08/04/2020 Elsevier Patient Education  2023 [employer]. Hypertension, Adult Hypertension is another name for high blood pressure. High blood pressure forces your heart to work harder to pump blood. This can cause problems over time. There are two numbers in a blood pressure reading. There is a top number (systolic) over a bottom number (diastolic). It is best to have a blood pressure that is below 120/80. What are the causes? The cause of this condition is not known. Some other conditions   can lead to high blood pressure. What increases the risk? Some lifestyle factors can make you more likely to develop high blood pressure: Smoking. Not getting enough exercise or physical activity. Being overweight. Having too much fat, sugar, calories, or salt (sodium) in your diet. Drinking too much alcohol. Other risk factors include: Having any of these conditions: Heart disease. Diabetes. High cholesterol. Kidney disease. Obstructive sleep apnea. Having a family history of high blood pressure and high cholesterol. Age. The risk increases with  age. Stress. What are the signs or symptoms? High blood pressure may not cause symptoms. Very high blood pressure (hypertensive crisis) may cause: Headache. Fast or uneven heartbeats (palpitations). Shortness of breath. Nosebleed. Vomiting or feeling like you may vomit (nauseous). Changes in how you see. Very bad chest pain. Feeling dizzy. Seizures. How is this treated? This condition is treated by making healthy lifestyle changes, such as: Eating healthy foods. Exercising more. Drinking less alcohol. Your doctor may prescribe medicine if lifestyle changes do not help enough and if: Your top number is above 130. Your bottom number is above 80. Your personal target blood pressure may vary. Follow these instructions at home: Eating and drinking  If told, follow the DASH eating plan. To follow this plan: Fill one half of your plate at each meal with fruits and vegetables. Fill one fourth of your plate at each meal with whole grains. Whole grains include whole-wheat pasta, brown rice, and whole-grain bread. Eat or drink low-fat dairy products, such as skim milk or low-fat yogurt. Fill one fourth of your plate at each meal with low-fat (lean) proteins. Low-fat proteins include fish, chicken without skin, eggs, beans, and tofu. Avoid fatty meat, cured and processed meat, or chicken with skin. Avoid pre-made or processed food. Limit the amount of salt in your diet to less than 1,500 mg each day. Do not drink alcohol if: Your doctor tells you not to drink. You are pregnant, may be pregnant, or are planning to become pregnant. If you drink alcohol: Limit how much you have to: 0-1 drink a day for women. 0-2 drinks a day for men. Know how much alcohol is in your drink. In the [place] one drink equals one 12 oz bottle of beer (355 mL), one 5 oz glass of wine (148 mL), or one 1 oz glass of hard liquor (44 mL). Lifestyle  Work with your doctor to stay at a healthy weight or to lose  weight. Ask your doctor what the best weight is for you. Get at least 30 minutes of exercise that causes your heart to beat faster (aerobic exercise) most days of the week. This may include walking, swimming, or biking. Get at least 30 minutes of exercise that strengthens your muscles (resistance exercise) at least 3 days a week. This may include lifting weights or doing Pilates. Do not smoke or use any products that contain nicotine or tobacco. If you need help quitting, ask your doctor. Check your blood pressure at home as told by your doctor. Keep all follow-up visits. Medicines Take over-the-counter and prescription medicines only as told by your doctor. Follow directions carefully. Do not skip doses of blood pressure medicine. The medicine does not work as well if you skip doses. Skipping doses also puts you at risk for problems. Ask your doctor about side effects or reactions to medicines that you should watch for. Contact a doctor if: You think you are having a reaction to the medicine you are taking. You have headaches that keep   coming back. You feel dizzy. You have swelling in your ankles. You have trouble with your vision. Get help right away if: You get a very bad headache. You start to feel mixed up (confused). You feel weak or numb. You feel faint. You have very bad pain in your: Chest. Belly (abdomen). You vomit more than once. You have trouble breathing. These symptoms may be an emergency. Get help right away. Call 911. Do not wait to see if the symptoms will go away. Do not drive yourself to the hospital. Summary Hypertension is another name for high blood pressure. High blood pressure forces your heart to work harder to pump blood. For most people, a normal blood pressure is less than 120/80. Making healthy choices can help lower blood pressure. If your blood pressure does not get lower with healthy choices, you may need to take medicine. This information is not  intended to replace advice given to you by your health care provider. Make sure you discuss any questions you have with your health care provider. Document Revised: 03/19/2021 Document Reviewed: 03/19/2021 Elsevier Patient Education  2023 Elsevier Inc.  

## 2021-11-17 LAB — CBC WITH DIFFERENTIAL/PLATELET
Basophils Absolute: 0 10*3/uL (ref 0.0–0.2)
Basos: 0 %
EOS (ABSOLUTE): 0.1 10*3/uL (ref 0.0–0.4)
Eos: 1 %
Hematocrit: 40.2 % (ref 34.0–46.6)
Hemoglobin: 13.2 g/dL (ref 11.1–15.9)
Immature Grans (Abs): 0 10*3/uL (ref 0.0–0.1)
Immature Granulocytes: 0 %
Lymphocytes Absolute: 1.8 10*3/uL (ref 0.7–3.1)
Lymphs: 37 %
MCH: 27.8 pg (ref 26.6–33.0)
MCHC: 32.8 g/dL (ref 31.5–35.7)
MCV: 85 fL (ref 79–97)
Monocytes Absolute: 0.5 10*3/uL (ref 0.1–0.9)
Monocytes: 11 %
Neutrophils Absolute: 2.5 10*3/uL (ref 1.4–7.0)
Neutrophils: 51 %
Platelets: 240 10*3/uL (ref 150–450)
RBC: 4.74 x10E6/uL (ref 3.77–5.28)
RDW: 13 % (ref 11.7–15.4)
WBC: 4.9 10*3/uL (ref 3.4–10.8)

## 2021-11-17 LAB — LIPID PANEL
Chol/HDL Ratio: 3.1 ratio (ref 0.0–4.4)
Cholesterol, Total: 194 mg/dL (ref 100–199)
HDL: 62 mg/dL (ref >39)
LDL Chol Calc (NIH): 112 mg/dL — ABNORMAL HIGH (ref 0–99)
Triglycerides: 110 mg/dL (ref 0–149)
VLDL Cholesterol Cal: 20 mg/dL (ref 5–40)

## 2021-11-23 DIAGNOSIS — Z1211 Encounter for screening for malignant neoplasm of colon: Secondary | ICD-10-CM | POA: Diagnosis not present

## 2021-12-02 LAB — COLOGUARD: COLOGUARD: NEGATIVE

## 2021-12-25 DIAGNOSIS — H2513 Age-related nuclear cataract, bilateral: Secondary | ICD-10-CM | POA: Diagnosis not present

## 2021-12-25 DIAGNOSIS — H40033 Anatomical narrow angle, bilateral: Secondary | ICD-10-CM | POA: Diagnosis not present

## 2022-02-01 ENCOUNTER — Other Ambulatory Visit: Payer: Self-pay | Admitting: Obstetrics and Gynecology

## 2022-02-01 NOTE — Telephone Encounter (Addendum)
Med refill request:Vivelle-Dot 0.0375 mg patch twice weekly Last AEX: OV 10/19/21 -slowly wean off estrogen, f/ 5-6 mo Next AEX: OV scheduled 04/22/22 Last MMG (if hormonal med) 08/07/21 Bi-Rads 1 neg  Spoke with patient. States she has not weaned off estrogen patch to date. Will further discuss at follow-up. Request refill.    Refill authorized: Please Advise?

## 2022-04-12 ENCOUNTER — Other Ambulatory Visit: Payer: Self-pay | Admitting: Nurse Practitioner

## 2022-04-12 DIAGNOSIS — I10 Essential (primary) hypertension: Secondary | ICD-10-CM

## 2022-04-12 DIAGNOSIS — E7800 Pure hypercholesterolemia, unspecified: Secondary | ICD-10-CM

## 2022-04-22 ENCOUNTER — Ambulatory Visit: Payer: Medicare PPO | Admitting: Obstetrics and Gynecology

## 2022-04-22 ENCOUNTER — Encounter: Payer: Self-pay | Admitting: Obstetrics and Gynecology

## 2022-04-22 VITALS — BP 126/80 | Ht 59.0 in | Wt 160.0 lb

## 2022-04-22 DIAGNOSIS — Z5181 Encounter for therapeutic drug level monitoring: Secondary | ICD-10-CM

## 2022-04-22 DIAGNOSIS — N951 Menopausal and female climacteric states: Secondary | ICD-10-CM

## 2022-04-22 MED ORDER — GABAPENTIN 100 MG PO CAPS: ORAL_CAPSULE | ORAL | 1 refills | Status: DC

## 2022-04-22 MED ORDER — ESTRADIOL 0.0375 MG/24HR TD PTTW: MEDICATED_PATCH | TRANSDERMAL | 2 refills | Status: DC

## 2022-04-22 NOTE — Patient Instructions (Signed)
Gabapentin Capsules or Tablets What is this medication? GABAPENTIN (GA ba pen tin) treats nerve pain. It may also be used to prevent and control seizures in people with epilepsy. It works by calming overactive nerves in your body. This medicine may be used for other purposes; ask your health care provider or pharmacist if you have questions. COMMON BRAND NAME(S): Active-PAC with Gabapentin, Gabarone, Gralise, Neurontin What should I tell my care team before I take this medication? They need to know if you have any of these conditions: Alcohol or substance use disorder Kidney disease Lung or breathing disease Suicidal thoughts, plans, or attempt; a previous suicide attempt by you or a family member An unusual or allergic reaction to gabapentin, other medications, foods, dyes, or preservatives Pregnant or trying to get pregnant Breast-feeding How should I use this medication? Take this medication by mouth with a glass of water. Follow the directions on the prescription label. You can take it with or without food. If it upsets your stomach, take it with food. Take your medication at regular intervals. Do not take it more often than directed. Do not stop taking except on your care team's advice. If you are directed to break the 600 or 800 mg tablets in half as part of your dose, the extra half tablet should be used for the next dose. If you have not used the extra half tablet within 28 days, it should be thrown away. A special MedGuide will be given to you by the pharmacist with each prescription and refill. Be sure to read this information carefully each time. Talk to your care team about the use of this medication in children. While this medication may be prescribed for children as young as 3 years for selected conditions, precautions do apply. Overdosage: If you think you have taken too much of this medicine contact a poison control center or emergency room at once. NOTE: This medicine is only for  you. Do not share this medicine with others. What if I miss a dose? If you miss a dose, take it as soon as you can. If it is almost time for your next dose, take only that dose. Do not take double or extra doses. What may interact with this medication? Alcohol Antihistamines for allergy, cough, and cold Certain medications for anxiety or sleep Certain medications for depression like amitriptyline, fluoxetine, sertraline Certain medications for seizures like phenobarbital, primidone Certain medications for stomach problems General anesthetics like halothane, isoflurane, methoxyflurane, propofol Local anesthetics like lidocaine, pramoxine, tetracaine Medications that relax muscles for surgery Opioid medications for pain Phenothiazines like chlorpromazine, mesoridazine, prochlorperazine, thioridazine This list may not describe all possible interactions. Give your health care provider a list of all the medicines, herbs, non-prescription drugs, or dietary supplements you use. Also tell them if you smoke, drink alcohol, or use illegal drugs. Some items may interact with your medicine. What should I watch for while using this medication? Visit your care team for regular checks on your progress. You may want to keep a record at home of how you feel your condition is responding to treatment. You may want to share this information with your care team at each visit. You should contact your care team if your seizures get worse or if you have any new types of seizures. Do not stop taking this medication or any of your seizure medications unless instructed by your care team. Stopping your medication suddenly can increase your seizures or their severity. This medication may cause serious skin   reactions. They can happen weeks to months after starting the medication. Contact your care team right away if you notice fevers or flu-like symptoms with a rash. The rash may be red or purple and then turn into blisters or  peeling of the skin. Or, you might notice a red rash with swelling of the face, lips or lymph nodes in your neck or under your arms. Wear a medical identification bracelet or chain if you are taking this medication for seizures. Carry a card that lists all your medications. This medication may affect your coordination, reaction time, or judgment. Do not drive or operate machinery until you know how this medication affects you. Sit up or stand slowly to reduce the risk of dizzy or fainting spells. Drinking alcohol with this medication can increase the risk of these side effects. Your mouth may get dry. Chewing sugarless gum or sucking hard candy, and drinking plenty of water may help. Watch for new or worsening thoughts of suicide or depression. This includes sudden changes in mood, behaviors, or thoughts. These changes can happen at any time but are more common in the beginning of treatment or after a change in dose. Call your care team right away if you experience these thoughts or worsening depression. If you become pregnant while using this medication, you may enroll in the [place] Antiepileptic Drug Pregnancy Registry by calling 1-888-233-2334. This registry collects information about the safety of antiepileptic medication use during pregnancy. What side effects may I notice from receiving this medication? Side effects that you should report to your care team as soon as possible: Allergic reactions or angioedema--skin rash, itching, hives, swelling of the face, eyes, lips, tongue, arms, or legs, trouble swallowing or breathing Rash, fever, and swollen lymph nodes Thoughts of suicide or self harm, worsening mood, feelings of depression Trouble breathing Unusual changes in mood or behavior in children after use such as difficulty concentrating, hostility, or restlessness Side effects that usually do not require medical attention (report to your care team if they continue or are  bothersome): Dizziness Drowsiness Nausea Swelling of ankles, feet, or hands Vomiting This list may not describe all possible side effects. Call your doctor for medical advice about side effects. You may report side effects to FDA at 1-800-FDA-1088. Where should I keep my medication? Keep out of reach of children and pets. Store at room temperature between 15 and 30 degrees C (59 and 86 degrees F). Get rid of any unused medication after the expiration date. This medication may cause accidental overdose and death if taken by other adults, children, or pets. To get rid of medications that are no longer needed or have expired: Take the medication to a medication take-back program. Check with your pharmacy or law enforcement to find a location. If you cannot return the medication, check the label or package insert to see if the medication should be thrown out in the garbage or flushed down the toilet. If you are not sure, ask your care team. If it is safe to put it in the trash, empty the medication out of the container. Mix the medication with cat litter, dirt, coffee grounds, or other unwanted substance. Seal the mixture in a bag or container. Put it in the trash. NOTE: This sheet is a summary. It may not cover all possible information. If you have questions about this medicine, talk to your doctor, pharmacist, or health care provider.  2023 Elsevier/Gold Standard (2020-12-01 00:00:00)  

## 2022-04-22 NOTE — Progress Notes (Signed)
GYNECOLOGY  VISIT   HPI: 76 y.o.   Married  [place]  female   [phone] with No LMP recorded. Patient has had a hysterectomy.   here for  follow up.  Weaning off HRT.  She was prescribed Vivelle Dot 0.0375 mg twice weekly.  She is using it once a week, starting in September.  She states she was not able to get refills to use it twice weekly.   She is having hot flashes at night.  She is not having so much hot flashes during the day.  Patch is adhering to her skin.   She is not sleeping at night.  Hx insomnia on chart review.  GYNECOLOGIC HISTORY: No LMP recorded. Patient has had a hysterectomy. Contraception:  postmenopausal Menopausal hormone therapy:  Vivelle Dot 0.0375mg   Last mammogram: 06-04-21 Neg/Birads1  Last pap smear:  Years ago -- Hx of abnormal pap. Per patient had hyst due to abnormal pap smear.         OB History     Gravida  5   Para  4   Term      Preterm      AB  1   Living  4      SAB  1   IAB      Ectopic      Multiple      Live Births                 Patient Active Problem List   Diagnosis Date Noted   Right leg pain 09/03/2020   Benign head tremor 11/14/2018   Stroke-like symptoms 06/17/2014   Degeneration of cervical intervertebral disc 06/12/2014   Neck pain 06/12/2014   Annual physical exam 10/12/2013   Insomnia 10/12/2013   Carpal tunnel syndrome 10/26/2012   Overweight 09/18/2012   OA (osteoarthritis) of knee 03/09/2012   Headache 03/09/2012   Post menopausal syndrome 03/09/2012   HTN (hypertension) 10/07/2011   Hyperlipidemia 10/07/2011   Internal hemorrhoids 10/07/2011    Past Medical History:  Diagnosis Date   Allergy    Arthritis    left knee   History of abnormal cervical Pap smear    Had Hyst due to abnormal paps per patient   Hyperlipidemia    Hypertension     Past Surgical History:  Procedure Laterality Date   ABDOMINAL HYSTERECTOMY     KNEE SURGERY     left    Current Outpatient  Medications  Medication Sig Dispense Refill   atorvastatin (LIPITOR) 40 MG tablet TAKE 1 TABLET EVERY DAY 90 tablet 0   gabapentin (NEURONTIN) 100 MG capsule Take one capsule (100 mg) by mouth at night.  After 3 nights, increase to two capsules (200 mg) by mouth at night.  After 3 more nights, increase to three capsules (300 mg) by mouth at night. 90 capsule 1   lisinopril (ZESTRIL) 40 MG tablet TAKE 1 TABLET EVERY DAY 90 tablet 0   metoprolol tartrate (LOPRESSOR) 25 MG tablet Take 0.5 tablets (12.5 mg total) by mouth 2 (two) times daily. 180 tablet 1   Saccharomyces boulardii (PROBIOTIC) 250 MG CAPS Take by mouth.     estradiol (DOTTI) 0.0375 MG/24HR APPLY 1 PATCH TOPICALLY TWICE A WEEK 8 patch 2   No current facility-administered medications for this visit.     ALLERGIES: Patient has no known allergies.  Family History  Problem Relation Age of Onset   Heart disease Mother    Kidney disease Father  Breast cancer Maternal Aunt    Colon cancer Neg Hx    Esophageal cancer Neg Hx    Stomach cancer Neg Hx     Social History   Socioeconomic History   Marital status: Married    Spouse name: Remi Deter   Number of children: 4   Years of education: Not on file   Highest education level: Not on file  Occupational History   Occupation: Retired  Tobacco Use   Smoking status: Never   Smokeless tobacco: Never  Vaping Use   Vaping Use: Never used  Substance and Sexual Activity   Alcohol use: No    Alcohol/week: 0.0 standard drinks of alcohol   Drug use: No   Sexual activity: Not Currently    Birth control/protection: None    Comment: first intercourse <18  Other Topics Concern   Not on file  Social History Narrative   Married x 57 years in 2022.   11 grandchildren   5 great grandchildren.   Social Determinants of Health   Financial Resource Strain: Low Risk  (05/13/2021)   Overall Financial Resource Strain (CARDIA)    Difficulty of Paying Living Expenses: Not hard at all   Food Insecurity: No Food Insecurity (05/13/2021)   Hunger Vital Sign    Worried About Running Out of Food in the Last Year: Never true    Ran Out of Food in the Last Year: Never true  Transportation Needs: No Transportation Needs (05/13/2021)   PRAPARE - Administrator, Civil Service (Medical): No    Lack of Transportation (Non-Medical): No  Physical Activity: Insufficiently Active (05/13/2021)   Exercise Vital Sign    Days of Exercise per Week: 4 days    Minutes of Exercise per Session: 30 min  Stress: No Stress Concern Present (05/13/2021)   [employer] of Occupational Health - Occupational Stress Questionnaire    Feeling of Stress : Not at all  Social Connections: Socially Integrated (05/13/2021)   Social Connection and Isolation Panel [NHANES]    Frequency of Communication with Friends and Family: More than three times a week    Frequency of Social Gatherings with Friends and Family: More than three times a week    Attends Religious Services: More than 4 times per year    Active Member of [employer] or Organizations: Yes    Attends Banker Meetings: More than 4 times per year    Marital Status: Married  Catering manager Violence: Not At Risk (05/13/2021)   Humiliation, Afraid, Rape, and Kick questionnaire    Fear of Current or Ex-Partner: No    Emotionally Abused: No    Physically Abused: No    Sexually Abused: No    Review of Systems  All other systems reviewed and are negative.   PHYSICAL EXAMINATION:    BP 126/80 (BP Location: Right Arm, Patient Position: Sitting, Cuff Size: Normal)   Ht 4\' 11"  (1.499 m)   Wt 160 lb (72.6 kg)   BMI 32.32 kg/m     General appearance: alert, cooperative and appears stated age   ASSESSMENT  Menopausal symptoms.  Night time hot flashes.  On ERT, transdermal estrogen once weekly. Medication monitoring encounter.  Hx insomnia.   PLAN  Continue Vivelle Dot 0.0375 mg twice weekly.  Disp:  8, RF;  2. We  discussed starting Gabapentin at hs.  She will start with 100 mg q hs x 3 nights and increased 100 mg every 3 nights up to a total  of 300 mg nightly.   Potential side effects reviewed.  She will follow up in 6 weeks.    An After Visit Summary was printed and given to the patient.

## 2022-04-28 ENCOUNTER — Encounter: Payer: Self-pay | Admitting: Nurse Practitioner

## 2022-04-28 ENCOUNTER — Ambulatory Visit: Payer: Medicare PPO | Admitting: Nurse Practitioner

## 2022-04-28 VITALS — BP 180/81 | HR 58 | Temp 98.4°F | Ht 59.0 in | Wt 151.0 lb

## 2022-04-28 DIAGNOSIS — M79645 Pain in left finger(s): Secondary | ICD-10-CM | POA: Diagnosis not present

## 2022-04-28 DIAGNOSIS — I10 Essential (primary) hypertension: Secondary | ICD-10-CM | POA: Diagnosis not present

## 2022-04-28 MED ORDER — METOPROLOL TARTRATE 25 MG PO TABS: 25.0000 mg | ORAL_TABLET | Freq: Two times a day (BID) | ORAL | 3 refills | Status: DC

## 2022-04-28 MED ORDER — PREDNISONE 10 MG PO TABS: 10.0000 mg | ORAL_TABLET | Freq: Every day | ORAL | 10 refills | Status: DC

## 2022-04-28 NOTE — Progress Notes (Unsigned)
   Acute Office Visit  Subjective:     Patient ID: Mary Wilkinson. Justo, female    DOB: 12/12/1945, 76 y.o.   MRN: [id number]  Chief Complaint  Patient presents with   Blood Pressure Check    HPI Patient is in today for elevated blood pressure.  She is currently managed on Metroprolol 12.5 mg tablet by mouth twice daily and lisinopril 50 mg tablet by mouth daily.  Patient denies headache, visual changes and chest pain.  Review of Systems  Constitutional: Negative.  Negative for chills and fever.  HENT: Negative.    Eyes: Negative.   Respiratory: Negative.    Cardiovascular: Negative.   Genitourinary: Negative.   Skin: Negative.  Negative for itching and rash.  Neurological:  Negative for dizziness, weakness and headaches.  All other systems reviewed and are negative.       Objective:    Temp 98.4 F (36.9 C)   Ht 4\' 11"  (1.499 m)   Wt 151 lb (68.5 kg)   BMI 30.50 kg/m  {Vitals History (Optional):23777}  Physical Exam Vitals and nursing note reviewed.  Constitutional:      Appearance: Normal appearance.  HENT:     Head: Normocephalic.     Right Ear: External ear normal.     Left Ear: External ear normal.     Nose: Nose normal.     Mouth/Throat:     Mouth: Mucous membranes are moist.  Eyes:     Conjunctiva/sclera: Conjunctivae normal.  Cardiovascular:     Rate and Rhythm: Normal rate and regular rhythm.     Heart sounds: Normal heart sounds.  Pulmonary:     Effort: Pulmonary effort is normal.     Breath sounds: Normal breath sounds.  Abdominal:     General: Bowel sounds are normal.  Musculoskeletal:        General: Normal range of motion.  Neurological:     Mental Status: She is alert.     No results found for any visits on 04/28/22.      Assessment & Plan:   Problem List Items Addressed This Visit   None   No orders of the defined types were placed in this encounter.   No follow-ups on file.  04/30/22, NP

## 2022-04-28 NOTE — Patient Instructions (Signed)
Hypertension, Adult ?Hypertension is another name for high blood pressure. High blood pressure forces your heart to work harder to pump blood. This can cause problems over time. ?There are two numbers in a blood pressure reading. There is a top number (systolic) over a bottom number (diastolic). It is best to have a blood pressure that is below 120/80. ?What are the causes? ?The cause of this condition is not known. Some other conditions can lead to high blood pressure. ?What increases the risk? ?Some lifestyle factors can make you more likely to develop high blood pressure: ?Smoking. ?Not getting enough exercise or physical activity. ?Being overweight. ?Having too much fat, sugar, calories, or salt (sodium) in your diet. ?Drinking too much alcohol. ?Other risk factors include: ?Having any of these conditions: ?Heart disease. ?Diabetes. ?High cholesterol. ?Kidney disease. ?Obstructive sleep apnea. ?Having a family history of high blood pressure and high cholesterol. ?Age. The risk increases with age. ?Stress. ?What are the signs or symptoms? ?High blood pressure may not cause symptoms. Very high blood pressure (hypertensive crisis) may cause: ?Headache. ?Fast or uneven heartbeats (palpitations). ?Shortness of breath. ?Nosebleed. ?Vomiting or feeling like you may vomit (nauseous). ?Changes in how you see. ?Very bad chest pain. ?Feeling dizzy. ?Seizures. ?How is this treated? ?This condition is treated by making healthy lifestyle changes, such as: ?Eating healthy foods. ?Exercising more. ?Drinking less alcohol. ?Your doctor may prescribe medicine if lifestyle changes do not help enough and if: ?Your top number is above 130. ?Your bottom number is above 80. ?Your personal target blood pressure may vary. ?Follow these instructions at home: ?Eating and drinking ? ?If told, follow the DASH eating plan. To follow this plan: ?Fill one half of your plate at each meal with fruits and vegetables. ?Fill one fourth of your plate  at each meal with whole grains. Whole grains include whole-wheat pasta, brown rice, and whole-grain bread. ?Eat or drink low-fat dairy products, such as skim milk or low-fat yogurt. ?Fill one fourth of your plate at each meal with low-fat (lean) proteins. Low-fat proteins include fish, chicken without skin, eggs, beans, and tofu. ?Avoid fatty meat, cured and processed meat, or chicken with skin. ?Avoid pre-made or processed food. ?Limit the amount of salt in your diet to less than 1,500 mg each day. ?Do not drink alcohol if: ?Your doctor tells you not to drink. ?You are pregnant, may be pregnant, or are planning to become pregnant. ?If you drink alcohol: ?Limit how much you have to: ?0-1 drink a day for women. ?0-2 drinks a day for men. ?Know how much alcohol is in your drink. In the [place] one drink equals one 12 oz bottle of beer (355 mL), one 5 oz glass of wine (148 mL), or one 1? oz glass of hard liquor (44 mL). ?Lifestyle ? ?Work with your doctor to stay at a healthy weight or to lose weight. Ask your doctor what the best weight is for you. ?Get at least 30 minutes of exercise that causes your heart to beat faster (aerobic exercise) most days of the week. This may include walking, swimming, or biking. ?Get at least 30 minutes of exercise that strengthens your muscles (resistance exercise) at least 3 days a week. This may include lifting weights or doing Pilates. ?Do not smoke or use any products that contain nicotine or tobacco. If you need help quitting, ask your doctor. ?Check your blood pressure at home as told by your doctor. ?Keep all follow-up visits. ?Medicines ?Take over-the-counter and prescription medicines   only as told by your doctor. Follow directions carefully. ?Do not skip doses of blood pressure medicine. The medicine does not work as well if you skip doses. Skipping doses also puts you at risk for problems. ?Ask your doctor about side effects or reactions to medicines that you should watch  for. ?Contact a doctor if: ?You think you are having a reaction to the medicine you are taking. ?You have headaches that keep coming back. ?You feel dizzy. ?You have swelling in your ankles. ?You have trouble with your vision. ?Get help right away if: ?You get a very bad headache. ?You start to feel mixed up (confused). ?You feel weak or numb. ?You feel faint. ?You have very bad pain in your: ?Chest. ?Belly (abdomen). ?You vomit more than once. ?You have trouble breathing. ?These symptoms may be an emergency. Get help right away. Call 911. ?Do not wait to see if the symptoms will go away. ?Do not drive yourself to the hospital. ?Summary ?Hypertension is another name for high blood pressure. ?High blood pressure forces your heart to work harder to pump blood. ?For most people, a normal blood pressure is less than 120/80. ?Making healthy choices can help lower blood pressure. If your blood pressure does not get lower with healthy choices, you may need to take medicine. ?This information is not intended to replace advice given to you by your health care provider. Make sure you discuss any questions you have with your health care provider. ?Document Revised: 03/19/2021 Document Reviewed: 03/19/2021 ?Elsevier Patient Education ? 2023 Elsevier Inc. ? ?

## 2022-04-29 NOTE — Assessment & Plan Note (Signed)
Patient presents with elevated blood pressure.  She has been taking 12.5 mg of her metoprolol twice daily.  But had not taking any today before visit.  I provided education to patient to continue blood pressure medication as prescribed.  Increase metoprolol from 12.5 mg tablet by mouth to 50 mg tablet by mouth twice daily.  Educated patient to keep a blood pressure log for 1 week before and after medication and send in blood pressure log to clinic.  Follow-up in 2 to 4 weeks.  Patient verbalized understanding.

## 2022-05-18 ENCOUNTER — Encounter: Payer: Self-pay | Admitting: Nurse Practitioner

## 2022-05-18 ENCOUNTER — Ambulatory Visit: Payer: Medicare PPO | Admitting: Nurse Practitioner

## 2022-05-18 VITALS — BP 140/80 | HR 76 | Temp 98.6°F | Ht 59.0 in | Wt 148.0 lb

## 2022-05-18 DIAGNOSIS — E782 Mixed hyperlipidemia: Secondary | ICD-10-CM

## 2022-05-18 DIAGNOSIS — E785 Hyperlipidemia, unspecified: Secondary | ICD-10-CM | POA: Diagnosis not present

## 2022-05-18 DIAGNOSIS — I10 Essential (primary) hypertension: Secondary | ICD-10-CM

## 2022-05-18 NOTE — Assessment & Plan Note (Signed)
Blood pressure is not well controlled, calibrated patients BP machine today, Metoprolol was increased to 50 mg tablet by mouth twice a day last 2 weeks, emphasized the need to take medication as prescribed. Advised patient take metoprolol as prescribed and follow up in 2 weeks.

## 2022-05-18 NOTE — Patient Instructions (Signed)
Hypertension, Adult ?Hypertension is another name for high blood pressure. High blood pressure forces your heart to work harder to pump blood. This can cause problems over time. ?There are two numbers in a blood pressure reading. There is a top number (systolic) over a bottom number (diastolic). It is best to have a blood pressure that is below 120/80. ?What are the causes? ?The cause of this condition is not known. Some other conditions can lead to high blood pressure. ?What increases the risk? ?Some lifestyle factors can make you more likely to develop high blood pressure: ?Smoking. ?Not getting enough exercise or physical activity. ?Being overweight. ?Having too much fat, sugar, calories, or salt (sodium) in your diet. ?Drinking too much alcohol. ?Other risk factors include: ?Having any of these conditions: ?Heart disease. ?Diabetes. ?High cholesterol. ?Kidney disease. ?Obstructive sleep apnea. ?Having a family history of high blood pressure and high cholesterol. ?Age. The risk increases with age. ?Stress. ?What are the signs or symptoms? ?High blood pressure may not cause symptoms. Very high blood pressure (hypertensive crisis) may cause: ?Headache. ?Fast or uneven heartbeats (palpitations). ?Shortness of breath. ?Nosebleed. ?Vomiting or feeling like you may vomit (nauseous). ?Changes in how you see. ?Very bad chest pain. ?Feeling dizzy. ?Seizures. ?How is this treated? ?This condition is treated by making healthy lifestyle changes, such as: ?Eating healthy foods. ?Exercising more. ?Drinking less alcohol. ?Your doctor may prescribe medicine if lifestyle changes do not help enough and if: ?Your top number is above 130. ?Your bottom number is above 80. ?Your personal target blood pressure may vary. ?Follow these instructions at home: ?Eating and drinking ? ?If told, follow the DASH eating plan. To follow this plan: ?Fill one half of your plate at each meal with fruits and vegetables. ?Fill one fourth of your plate  at each meal with whole grains. Whole grains include whole-wheat pasta, brown rice, and whole-grain bread. ?Eat or drink low-fat dairy products, such as skim milk or low-fat yogurt. ?Fill one fourth of your plate at each meal with low-fat (lean) proteins. Low-fat proteins include fish, chicken without skin, eggs, beans, and tofu. ?Avoid fatty meat, cured and processed meat, or chicken with skin. ?Avoid pre-made or processed food. ?Limit the amount of salt in your diet to less than 1,500 mg each day. ?Do not drink alcohol if: ?Your doctor tells you not to drink. ?You are pregnant, may be pregnant, or are planning to become pregnant. ?If you drink alcohol: ?Limit how much you have to: ?0-1 drink a day for women. ?0-2 drinks a day for men. ?Know how much alcohol is in your drink. In the [place] one drink equals one 12 oz bottle of beer (355 mL), one 5 oz glass of wine (148 mL), or one 1? oz glass of hard liquor (44 mL). ?Lifestyle ? ?Work with your doctor to stay at a healthy weight or to lose weight. Ask your doctor what the best weight is for you. ?Get at least 30 minutes of exercise that causes your heart to beat faster (aerobic exercise) most days of the week. This may include walking, swimming, or biking. ?Get at least 30 minutes of exercise that strengthens your muscles (resistance exercise) at least 3 days a week. This may include lifting weights or doing Pilates. ?Do not smoke or use any products that contain nicotine or tobacco. If you need help quitting, ask your doctor. ?Check your blood pressure at home as told by your doctor. ?Keep all follow-up visits. ?Medicines ?Take over-the-counter and prescription medicines   only as told by your doctor. Follow directions carefully. ?Do not skip doses of blood pressure medicine. The medicine does not work as well if you skip doses. Skipping doses also puts you at risk for problems. ?Ask your doctor about side effects or reactions to medicines that you should watch  for. ?Contact a doctor if: ?You think you are having a reaction to the medicine you are taking. ?You have headaches that keep coming back. ?You feel dizzy. ?You have swelling in your ankles. ?You have trouble with your vision. ?Get help right away if: ?You get a very bad headache. ?You start to feel mixed up (confused). ?You feel weak or numb. ?You feel faint. ?You have very bad pain in your: ?Chest. ?Belly (abdomen). ?You vomit more than once. ?You have trouble breathing. ?These symptoms may be an emergency. Get help right away. Call 911. ?Do not wait to see if the symptoms will go away. ?Do not drive yourself to the hospital. ?Summary ?Hypertension is another name for high blood pressure. ?High blood pressure forces your heart to work harder to pump blood. ?For most people, a normal blood pressure is less than 120/80. ?Making healthy choices can help lower blood pressure. If your blood pressure does not get lower with healthy choices, you may need to take medicine. ?This information is not intended to replace advice given to you by your health care provider. Make sure you discuss any questions you have with your health care provider. ?Document Revised: 03/19/2021 Document Reviewed: 03/19/2021 ?Elsevier Patient Education ? 2023 Elsevier Inc. ? ?

## 2022-05-18 NOTE — Assessment & Plan Note (Signed)
Labs completed results pending. Continue low cholesterol diet. Follow up in 3-6 months

## 2022-05-18 NOTE — Progress Notes (Signed)
Established Patient Office Visit  Subjective   Patient ID: Mary Wilkinson. Eiland, female    DOB: Oct 21, 1945  Age: 76 y.o. MRN: [id number]  Chief Complaint  Patient presents with   Medical Management of Chronic Issues    6 month     Hypertension This is a chronic problem. The current episode started more than 1 year ago. The problem is unchanged. The problem is uncontrolled. Pertinent negatives include no chest pain. Agents associated with hypertension include estrogens. Risk factors for coronary artery disease include post-menopausal state and dyslipidemia. The current treatment provides moderate improvement. There are no compliance problems.   Hyperlipidemia This is a chronic problem. The current episode started more than 1 year ago. The problem is controlled. Recent lipid tests were reviewed and are low. Pertinent negatives include no chest pain or leg pain. The current treatment provides moderate improvement of lipids. There are no compliance problems.  Risk factors for coronary artery disease include post-menopausal and dyslipidemia.    Patient Active Problem List   Diagnosis Date Noted   Right leg pain 09/03/2020   Benign head tremor 11/14/2018   Stroke-like symptoms 06/17/2014   Degeneration of cervical intervertebral disc 06/12/2014   Neck pain 06/12/2014   Annual physical exam 10/12/2013   Insomnia 10/12/2013   Carpal tunnel syndrome 10/26/2012   Overweight 09/18/2012   OA (osteoarthritis) of knee 03/09/2012   Headache 03/09/2012   Post menopausal syndrome 03/09/2012   Essential hypertension 10/07/2011   Hyperlipidemia 10/07/2011   Internal hemorrhoids 10/07/2011   Past Medical History:  Diagnosis Date   Allergy    Arthritis    left knee   History of abnormal cervical Pap smear    Had Hyst due to abnormal paps per patient   Hyperlipidemia    Hypertension    Past Surgical History:  Procedure Laterality Date   ABDOMINAL HYSTERECTOMY     KNEE SURGERY     left    Social History   Tobacco Use   Smoking status: Never   Smokeless tobacco: Never  Vaping Use   Vaping Use: Never used  Substance Use Topics   Alcohol use: No    Alcohol/week: 0.0 standard drinks of alcohol   Drug use: No   Social History   Socioeconomic History   Marital status: Married    Spouse name: Mikeal Hawthorne   Number of children: 4   Years of education: Not on file   Highest education level: Not on file  Occupational History   Occupation: Retired  Tobacco Use   Smoking status: Never   Smokeless tobacco: Never  Vaping Use   Vaping Use: Never used  Substance and Sexual Activity   Alcohol use: No    Alcohol/week: 0.0 standard drinks of alcohol   Drug use: No   Sexual activity: Not Currently    Birth control/protection: None    Comment: first intercourse <18  Other Topics Concern   Not on file  Social History Narrative   Married x 57 years in 2022.   11 grandchildren   5 great grandchildren.   Social Determinants of Health   Financial Resource Strain: Low Risk  (05/13/2021)   Overall Financial Resource Strain (CARDIA)    Difficulty of Paying Living Expenses: Not hard at all  Food Insecurity: No Food Insecurity (05/13/2021)   Hunger Vital Sign    Worried About Running Out of Food in the Last Year: Never true    Ran Out of Food in the Last Year: Never true  Transportation Needs: No Transportation Needs (05/13/2021)   PRAPARE - Hydrologist (Medical): No    Lack of Transportation (Non-Medical): No  Physical Activity: Insufficiently Active (05/13/2021)   Exercise Vital Sign    Days of Exercise per Week: 4 days    Minutes of Exercise per Session: 30 min  Stress: No Stress Concern Present (05/13/2021)   [place]    Feeling of Stress : Not at all  Social Connections: [place] (05/13/2021)   Social Connection and Isolation Panel [NHANES]    Frequency of  Communication with Friends and Family: More than three times a week    Frequency of Social Gatherings with Friends and Family: More than three times a week    Attends Religious Services: More than 4 times per year    Active Member of [employer] or Organizations: Yes    Attends Archivist Meetings: More than 4 times per year    Marital Status: Married  Human resources officer Violence: Not At Risk (05/13/2021)   Humiliation, Afraid, Rape, and Kick questionnaire    Fear of Current or Ex-Partner: No    Emotionally Abused: No    Physically Abused: No    Sexually Abused: No   Family Status  Relation Name Status   Mother  Deceased   Father  Deceased   Sister  Alive   Sister  Alive   Sister  Alive   Sister  Deceased   Brother  Alive   Brother  Ecologist  (Not Specified)   MGM  Deceased   MGF  Deceased   PGM  Deceased   PGF  Deceased   Daughter 2 Alive   Son 2 Alive   Neg Hx  (Not Specified)   Family History  Problem Relation Age of Onset   Heart disease Mother    Kidney disease Father    Breast cancer Maternal Aunt    Colon cancer Neg Hx    Esophageal cancer Neg Hx    Stomach cancer Neg Hx    No Known Allergies    Review of Systems  Constitutional: Negative.   HENT: Negative.    Eyes: Negative.   Respiratory: Negative.  Negative for cough.   Cardiovascular:  Negative for chest pain.  Genitourinary: Negative.   Skin: Negative.  Negative for itching and rash.  Neurological: Negative.   All other systems reviewed and are negative.     Objective:     BP (!) 140/80   Pulse 76   Temp 98.6 F (37 C)   Ht _0  (1.499 m)   Wt 148 lb (67.1 kg)   SpO2 98%   BMI 29.89 kg/m  BP Readings from Last 3 Encounters:  05/18/22 (!) 140/80  04/28/22 (!) 180/81  04/22/22 126/80   Wt Readings from Last 3 Encounters:  05/18/22 148 lb (67.1 kg)  04/28/22 151 lb (68.5 kg)  04/22/22 160 lb (72.6 kg)      Physical Exam Vitals and nursing note reviewed.   Constitutional:      Appearance: Normal appearance.  HENT:     Head: Normocephalic.     Right Ear: External ear normal.     Left Ear: External ear normal.     Nose: Nose normal.     Mouth/Throat:     Mouth: Mucous membranes are moist.     Pharynx: Oropharynx is clear.  Eyes:     Conjunctiva/sclera:  Conjunctivae normal.  Cardiovascular:     Rate and Rhythm: Normal rate and regular rhythm.     Pulses: Normal pulses.     Heart sounds: Normal heart sounds.  Pulmonary:     Effort: Pulmonary effort is normal.     Breath sounds: Normal breath sounds.  Abdominal:     General: Bowel sounds are normal.  Skin:    General: Skin is warm.  Neurological:     General: No focal deficit present.     Mental Status: She is alert and oriented to person, place, and time.  Psychiatric:        Mood and Affect: Mood normal.        Behavior: Behavior normal.      No results found for any visits on 05/18/22.  Last CBC Lab Results  Component Value Date   WBC 4.9 11/16/2021   HGB 13.2 11/16/2021   HCT 40.2 11/16/2021   MCV 85 11/16/2021   MCH 27.8 11/16/2021   RDW 13.0 11/16/2021   PLT 240 53/20/2334   Last metabolic panel Lab Results  Component Value Date   GLUCOSE 88 08/14/2021   NA 139 08/14/2021   K 4.4 08/14/2021   CL 101 08/14/2021   CO2 24 08/14/2021   BUN 10 08/14/2021   CREATININE 0.94 08/14/2021   EGFR 64 08/14/2021   CALCIUM 10.0 08/14/2021   PROT 7.3 08/14/2021   ALBUMIN 4.8 (H) 08/14/2021   LABGLOB 2.5 08/14/2021   AGRATIO 1.9 08/14/2021   BILITOT 0.7 08/14/2021   ALKPHOS 74 08/14/2021   AST 27 08/14/2021   ALT 18 08/14/2021   Last lipids Lab Results  Component Value Date   CHOL 194 11/16/2021   HDL 62 11/16/2021   LDLCALC 112 (H) 11/16/2021   TRIG 110 11/16/2021   CHOLHDL 3.1 11/16/2021   Last thyroid functions No results found for: "TSH", "T3TOTAL", "T4TOTAL", "THYROIDAB"    The 10-year ASCVD risk score (Arnett DK, et al., 2019) is: 16.8%     Assessment & Plan:   Problem List Items Addressed This Visit       Cardiovascular and Mediastinum   Essential hypertension - Primary    Blood pressure is not well controlled, calibrated patients BP machine today, Metoprolol was increased to 50 mg tablet by mouth twice a day last 2 weeks, emphasized the need to take medication as prescribed. Advised patient take metoprolol as prescribed and follow up in 2 weeks.         Relevant Orders   CBC with Differential/Platelet   CMP14+EGFR   TSH     Other   Hyperlipidemia    Labs completed results pending. Continue low cholesterol diet. Follow up in 3-6 months      Relevant Orders   Lipid panel    Return in 6 months (on 11/17/2022).    Ivy Lynn, NP

## 2022-05-19 ENCOUNTER — Other Ambulatory Visit (HOSPITAL_COMMUNITY): Payer: Self-pay | Admitting: Nurse Practitioner

## 2022-05-19 DIAGNOSIS — Z1231 Encounter for screening mammogram for malignant neoplasm of breast: Secondary | ICD-10-CM

## 2022-05-19 LAB — CBC WITH DIFFERENTIAL/PLATELET
Basophils Absolute: 0 10*3/uL (ref 0.0–0.2)
Basos: 0 %
EOS (ABSOLUTE): 0 10*3/uL (ref 0.0–0.4)
Eos: 0 %
Hematocrit: 41.2 % (ref 34.0–46.6)
Hemoglobin: 12.9 g/dL (ref 11.1–15.9)
Immature Grans (Abs): 0 10*3/uL (ref 0.0–0.1)
Immature Granulocytes: 0 %
Lymphocytes Absolute: 1.7 10*3/uL (ref 0.7–3.1)
Lymphs: 35 %
MCH: 26.3 pg — ABNORMAL LOW (ref 26.6–33.0)
MCHC: 31.3 g/dL — ABNORMAL LOW (ref 31.5–35.7)
MCV: 84 fL (ref 79–97)
Monocytes Absolute: 0.5 10*3/uL (ref 0.1–0.9)
Monocytes: 9 %
Neutrophils Absolute: 2.7 10*3/uL (ref 1.4–7.0)
Neutrophils: 56 %
Platelets: 263 10*3/uL (ref 150–450)
RBC: 4.9 x10E6/uL (ref 3.77–5.28)
RDW: 13.1 % (ref 11.7–15.4)
WBC: 4.9 10*3/uL (ref 3.4–10.8)

## 2022-05-19 LAB — CMP14+EGFR
ALT: 21 IU/L (ref 0–32)
AST: 22 IU/L (ref 0–40)
Albumin/Globulin Ratio: 2.2 (ref 1.2–2.2)
Albumin: 4.8 g/dL (ref 3.8–4.8)
Alkaline Phosphatase: 83 IU/L (ref 44–121)
BUN/Creatinine Ratio: 8 — ABNORMAL LOW (ref 12–28)
BUN: 8 mg/dL (ref 8–27)
Bilirubin Total: 1.1 mg/dL (ref 0.0–1.2)
CO2: 23 mmol/L (ref 20–29)
Calcium: 9.6 mg/dL (ref 8.7–10.3)
Chloride: 103 mmol/L (ref 96–106)
Creatinine, Ser: 0.96 mg/dL (ref 0.57–1.00)
Globulin, Total: 2.2 g/dL (ref 1.5–4.5)
Glucose: 87 mg/dL (ref 70–99)
Potassium: 4.3 mmol/L (ref 3.5–5.2)
Sodium: 140 mmol/L (ref 134–144)
Total Protein: 7 g/dL (ref 6.0–8.5)
eGFR: 62 mL/min/{1.73_m2} (ref >59)

## 2022-05-19 LAB — LIPID PANEL
Chol/HDL Ratio: 3.1 ratio (ref 0.0–4.4)
Cholesterol, Total: 202 mg/dL — ABNORMAL HIGH (ref 100–199)
HDL: 65 mg/dL (ref >39)
LDL Chol Calc (NIH): 123 mg/dL — ABNORMAL HIGH (ref 0–99)
Triglycerides: 75 mg/dL (ref 0–149)
VLDL Cholesterol Cal: 14 mg/dL (ref 5–40)

## 2022-05-19 LAB — TSH: TSH: 0.742 u[IU]/mL (ref 0.450–4.500)

## 2022-05-20 NOTE — Progress Notes (Signed)
GYNECOLOGY  VISIT   HPI: 76 y.o.   Married  [place]  female   [phone] with No LMP recorded. Patient has had a hysterectomy.   here for  6 week follow up for treatment of menopausal symptoms.  Cooling down enough to be almost sleeping through the night.   She is currently using Dotti transdermal estrogen 0.0375 mg twice weekly.  It was temporarily unavailable.  She was prescribed Climara once a week, and she did not fill the Rx. The Dotti became available again.  She takes Gabapentin 300 mg at hs, and it makes her drowsy.     Bilateral breast tenderness recently.  She has been carrying wood.   GYNECOLOGIC HISTORY: No LMP recorded. Patient has had a hysterectomy. Contraception:  hysterectomy Menopausal hormone therapy:  estradiol Last mammogram:  06/16/21, Breast Density Category B, BI-RADS CATEGORY 1 Neg-- scheduled next week December 2023 Last pap smear:   Years ago -- Hx of abnormal pap. Per patient had hyst due to abnormal pap smear.         OB History     Gravida  5   Para  4   Term      Preterm      AB  1   Living  4      SAB  1   IAB      Ectopic      Multiple      Live Births                 Patient Active Problem List   Diagnosis Date Noted   Right leg pain 09/03/2020   Benign head tremor 11/14/2018   Stroke-like symptoms 06/17/2014   Degeneration of cervical intervertebral disc 06/12/2014   Neck pain 06/12/2014   Annual physical exam 10/12/2013   Insomnia 10/12/2013   Carpal tunnel syndrome 10/26/2012   Overweight 09/18/2012   OA (osteoarthritis) of knee 03/09/2012   Headache 03/09/2012   Post menopausal syndrome 03/09/2012   Essential hypertension 10/07/2011   Hyperlipidemia 10/07/2011   Internal hemorrhoids 10/07/2011    Past Medical History:  Diagnosis Date   Allergy    Arthritis    left knee   History of abnormal cervical Pap smear    Had Hyst due to abnormal paps per patient   Hyperlipidemia    Hypertension      Past Surgical History:  Procedure Laterality Date   ABDOMINAL HYSTERECTOMY     KNEE SURGERY     left    Current Outpatient Medications  Medication Sig Dispense Refill   atorvastatin (LIPITOR) 40 MG tablet TAKE 1 TABLET EVERY DAY 90 tablet 0   lisinopril (ZESTRIL) 40 MG tablet TAKE 1 TABLET EVERY DAY 90 tablet 0   metoprolol tartrate (LOPRESSOR) 25 MG tablet Take 1 tablet (25 mg total) by mouth 2 (two) times daily. 180 tablet 3   predniSONE (DELTASONE) 10 MG tablet Take 1 tablet (10 mg total) by mouth daily with breakfast. 6 tablet 10   Saccharomyces boulardii (PROBIOTIC) 250 MG CAPS Take by mouth.     estradiol (DOTTI) 0.0375 MG/24HR APPLY 1 PATCH TOPICALLY TWICE A WEEK 24 patch 0   gabapentin (NEURONTIN) 100 MG capsule Take two capsules (200 mg) by mouth at night to treat hot flashes. 60 capsule 2   No current facility-administered medications for this visit.     ALLERGIES: Patient has no known allergies.  Family History  Problem Relation Age of Onset   Heart disease Mother  Kidney disease Father    Breast cancer Maternal Aunt    Colon cancer Neg Hx    Esophageal cancer Neg Hx    Stomach cancer Neg Hx     Social History   Socioeconomic History   Marital status: Married    Spouse name: Remi Deter   Number of children: 4   Years of education: Not on file   Highest education level: Not on file  Occupational History   Occupation: Retired  Tobacco Use   Smoking status: Never   Smokeless tobacco: Never  Vaping Use   Vaping Use: Never used  Substance and Sexual Activity   Alcohol use: No    Alcohol/week: 0.0 standard drinks of alcohol   Drug use: No   Sexual activity: Not Currently    Birth control/protection: None    Comment: first intercourse <16,<5 sexual partners, no STD  Other Topics Concern   Not on file  Social History Narrative   Married x 57 years in 2022.   11 grandchildren   5 great grandchildren.   Social Determinants of Health   Financial  Resource Strain: Low Risk  (05/13/2021)   Overall Financial Resource Strain (CARDIA)    Difficulty of Paying Living Expenses: Not hard at all  Food Insecurity: No Food Insecurity (05/13/2021)   Hunger Vital Sign    Worried About Running Out of Food in the Last Year: Never true    Ran Out of Food in the Last Year: Never true  Transportation Needs: No Transportation Needs (05/13/2021)   PRAPARE - Administrator, Civil Service (Medical): No    Lack of Transportation (Non-Medical): No  Physical Activity: Insufficiently Active (05/13/2021)   Exercise Vital Sign    Days of Exercise per Week: 4 days    Minutes of Exercise per Session: 30 min  Stress: No Stress Concern Present (05/13/2021)   [employer] of Occupational Health - Occupational Stress Questionnaire    Feeling of Stress : Not at all  Social Connections: Socially Integrated (05/13/2021)   Social Connection and Isolation Panel [NHANES]    Frequency of Communication with Friends and Family: More than three times a week    Frequency of Social Gatherings with Friends and Family: More than three times a week    Attends Religious Services: More than 4 times per year    Active Member of [employer] or Organizations: Yes    Attends Banker Meetings: More than 4 times per year    Marital Status: Married  Catering manager Violence: Not At Risk (05/13/2021)   Humiliation, Afraid, Rape, and Kick questionnaire    Fear of Current or Ex-Partner: No    Emotionally Abused: No    Physically Abused: No    Sexually Abused: No    Review of Systems  All other systems reviewed and are negative.   PHYSICAL EXAMINATION:    BP 126/76   Pulse 60   Ht 4\' 11"  (1.499 m)   Wt 154 lb (69.9 kg)   SpO2 97%   BMI 31.10 kg/m     General appearance: alert, cooperative and appears stated age   ASSESSMENT  Menopausal symptoms.  Status post hysterectomy.  Encounter for medication monitoring.   PLAN  Refill of Doti 0.0375  mg twice weekly. Reduce Gabapentin to 200 mg.  We discussed the goal of her care is to reduce hot flashes/night sweats, but not necessarily suppress them entirely.  Fu in April 2023 for breast/pelvic exam and follow up.  She will contact the office, if she has any GYN concerns prior to then.    An After Visit Summary was printed and given to the patient.  24 min  total time was spent for this patient encounter, including preparation, face-to-face counseling with the patient, coordination of care, and documentation of the encounter.

## 2022-05-21 IMAGING — MG MM DIGITAL DIAGNOSTIC UNILAT*R* W/ TOMO W/ CAD
4 series · 4 of 12 positions shown · non-contrast
Comparison: Previous exams.

CLINICAL DATA: Screening recall for possible right breast
asymmetry.

EXAM:
DIGITAL DIAGNOSTIC UNILATERAL RIGHT MAMMOGRAM WITH TOMOSYNTHESIS AND
CAD
TECHNIQUE: Right digital diagnostic mammography and breast tomosynthesis was
performed. The images were evaluated with computer-aided detection.

[R ML synth-2D]
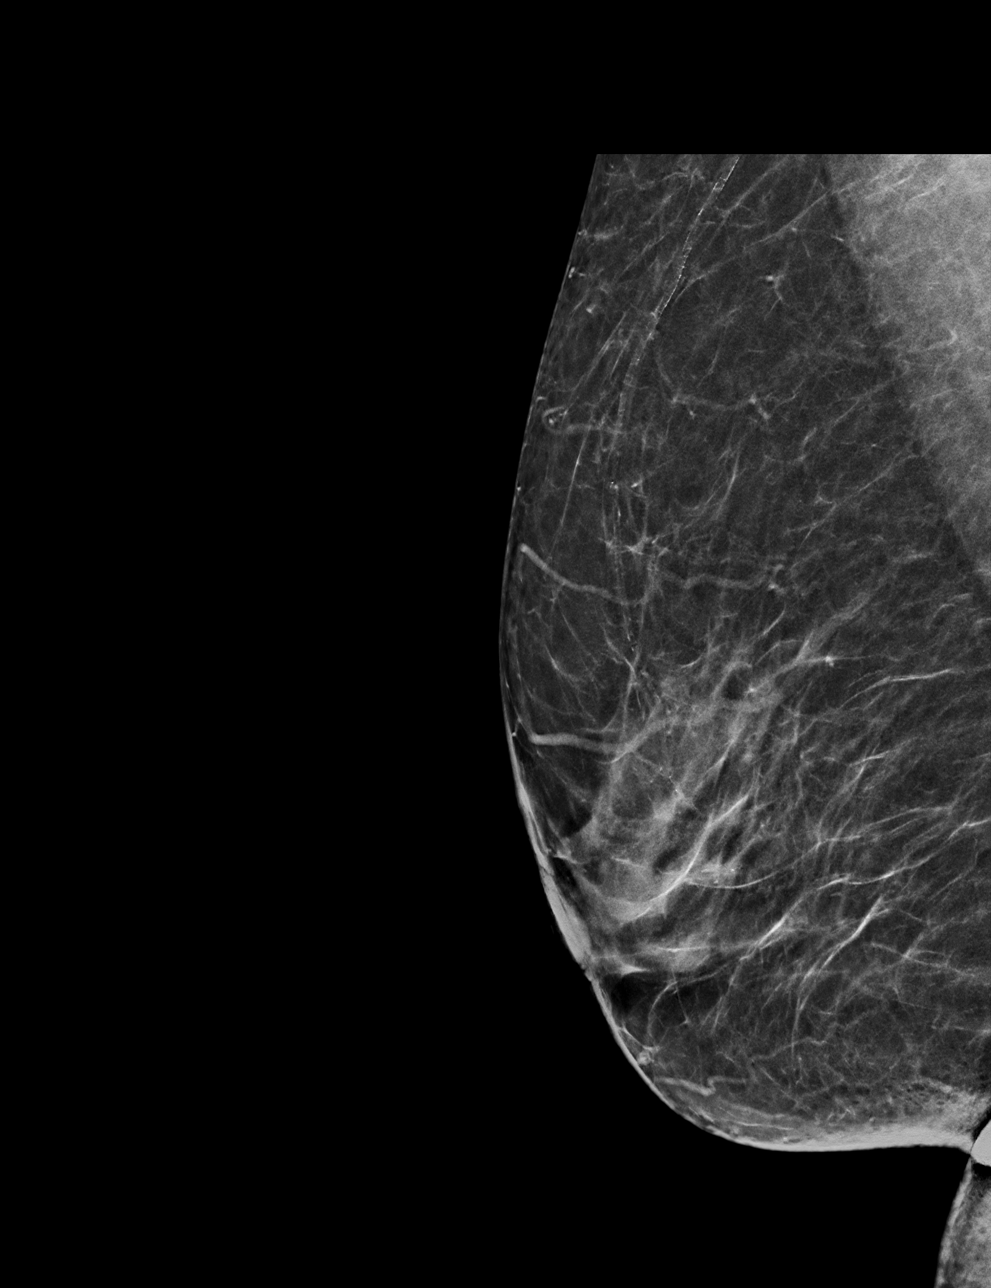

[R CC synth-2D]
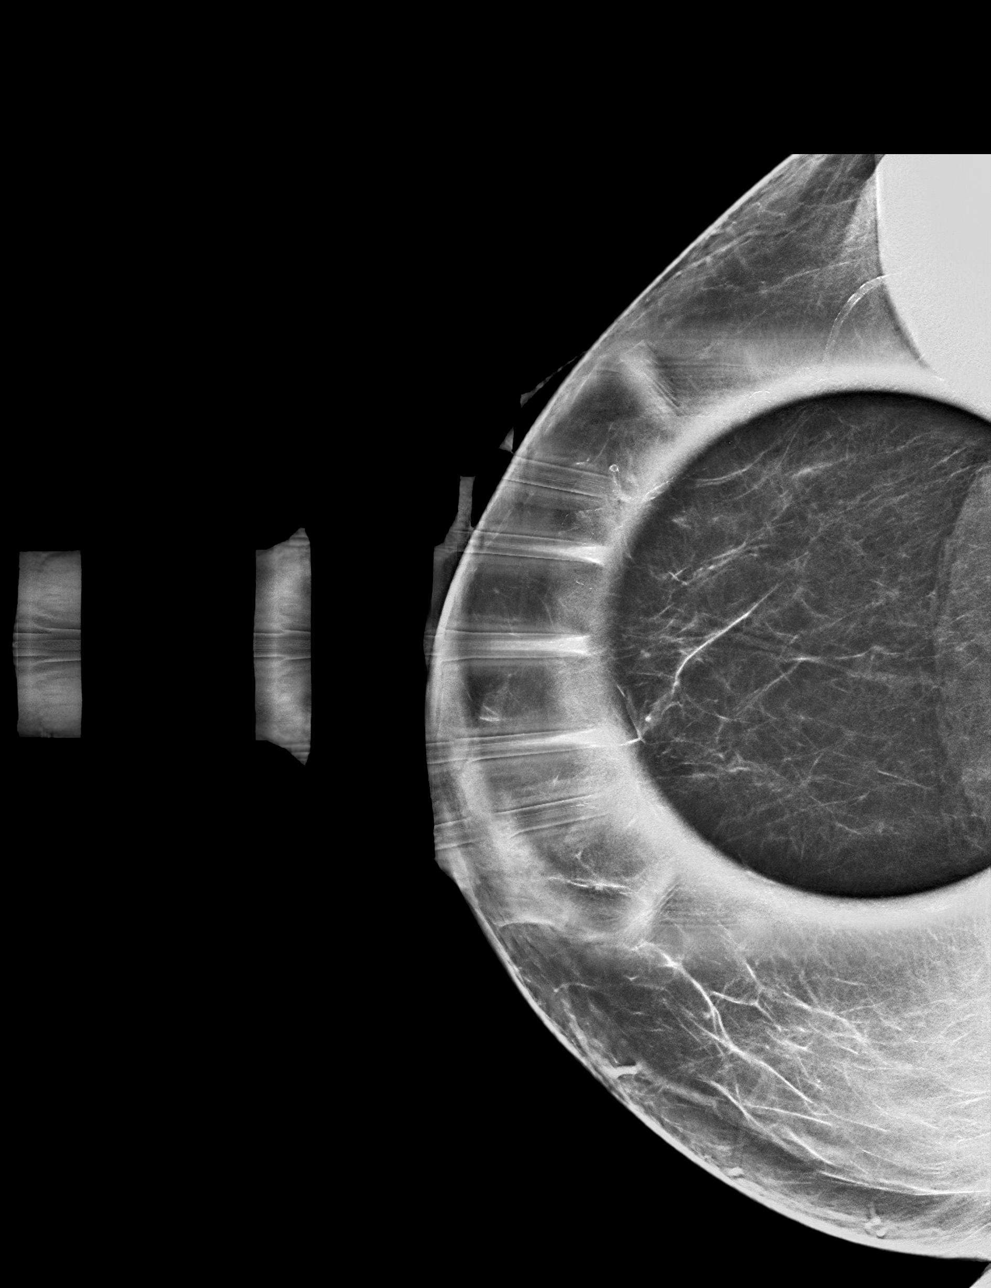

[R ML tomo · tomo slice 31/61.0]
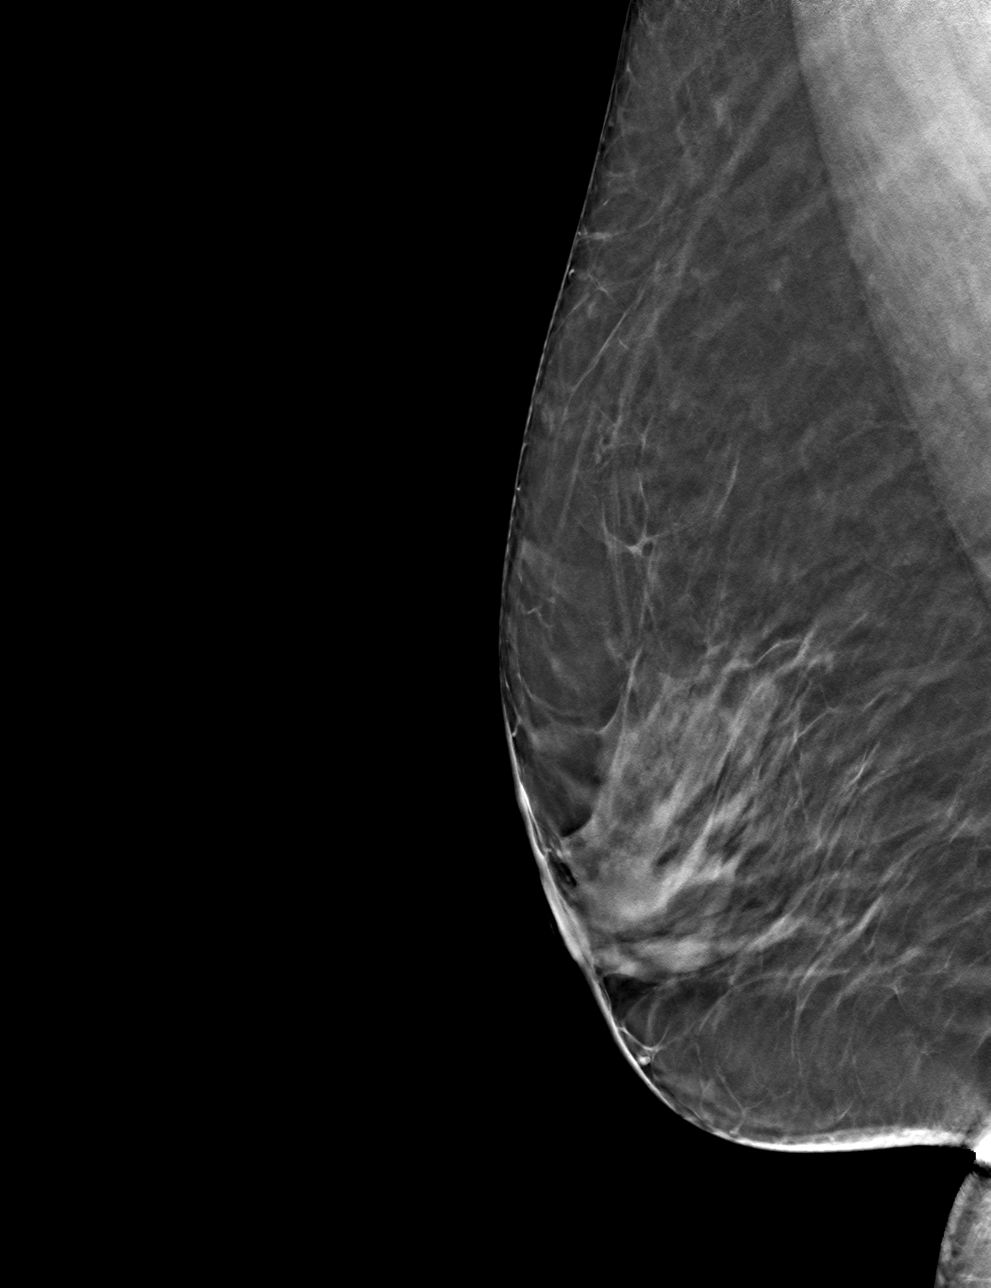

[R CC tomo · tomo slice 31/60.0]
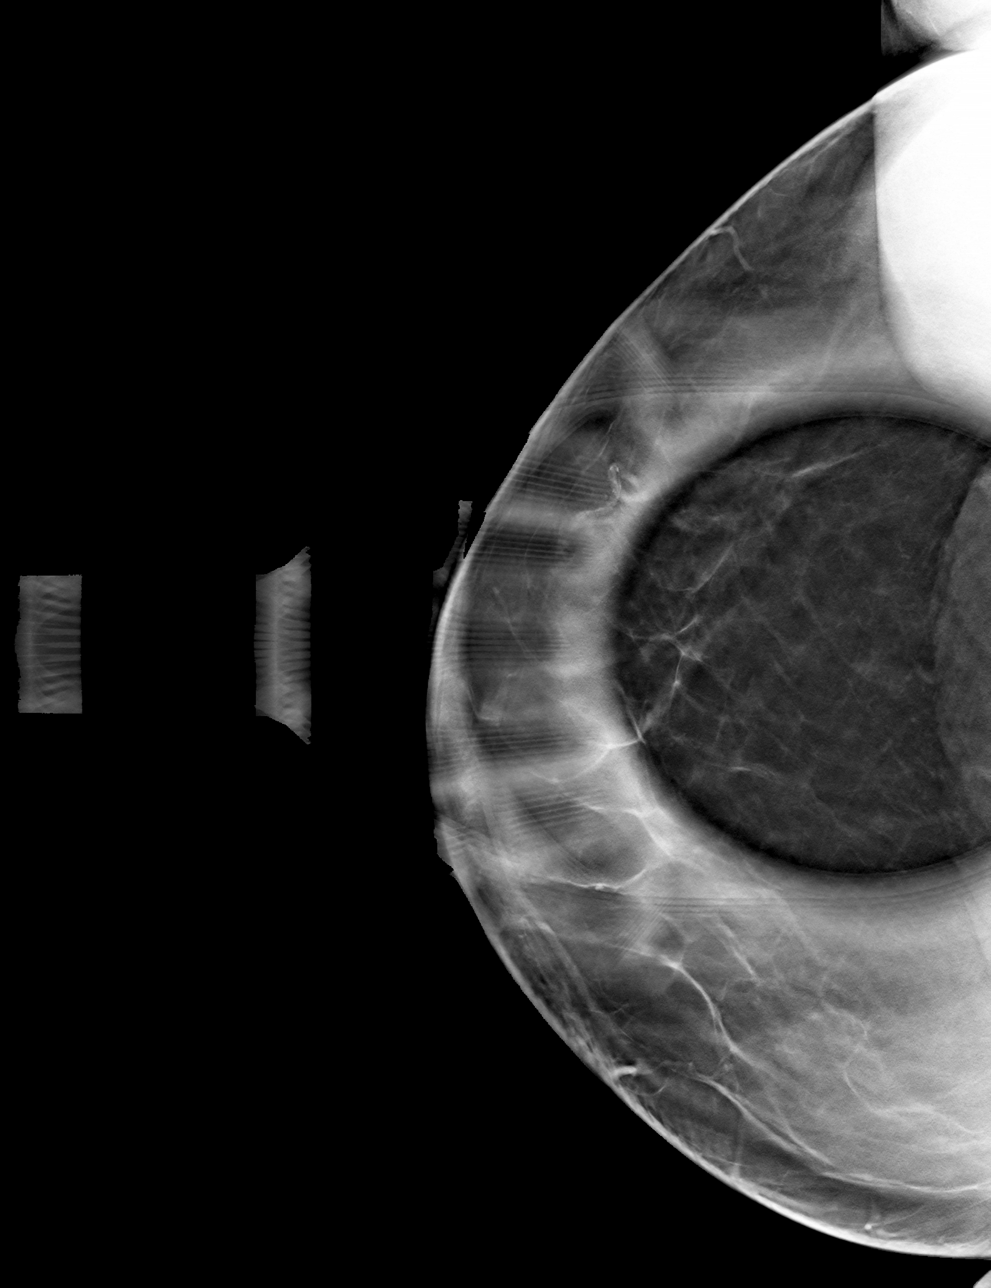

[4 of 12 positions shown; findings below may reference images not displayed]

ACR Breast Density Category b: There are scattered areas of
fibroglandular density.
FINDINGS: Additional tomograms were performed of the right breast. The
initially questioned possible right breast asymmetry resolves on the
additional imaging with findings compatible with an area of
overlapping fibroglandular tissue. There is no mammographic evidence
of malignancy in the right breast.
IMPRESSION: No mammographic evidence of malignancy in the right breast.

RECOMMENDATION:
Screening mammogram in one year.(Code:NT-B-RRO)

I have discussed the findings and recommendations with the patient.
If applicable, a reminder letter will be sent to the patient
regarding the next appointment.

BI-RADS CATEGORY  1: Negative.

## 2022-05-27 ENCOUNTER — Telehealth: Payer: Self-pay | Admitting: *Deleted

## 2022-05-27 NOTE — Telephone Encounter (Signed)
Message from pharmacy:    "Have not been able to get brand Dotti patches. Can you substitute for the generic estradiol patches?"  Routing to provider for review.

## 2022-05-28 MED ORDER — ESTRADIOL 0.0375 MG/24HR TD PTWK: 0.0375 mg | MEDICATED_PATCH | TRANSDERMAL | 0 refills | Status: DC

## 2022-05-28 NOTE — Telephone Encounter (Signed)
Ok for estradiol transdermal weekly patch 0.0375 mg. Apply to skin once weekly.  Disp:  4 RF:  none.

## 2022-05-28 NOTE — Telephone Encounter (Signed)
Rx sent 

## 2022-06-03 ENCOUNTER — Encounter: Payer: Self-pay | Admitting: Obstetrics and Gynecology

## 2022-06-03 ENCOUNTER — Ambulatory Visit: Payer: Medicare PPO | Admitting: Obstetrics and Gynecology

## 2022-06-03 VITALS — BP 126/76 | HR 60 | Ht 59.0 in | Wt 154.0 lb

## 2022-06-03 DIAGNOSIS — N951 Menopausal and female climacteric states: Secondary | ICD-10-CM | POA: Diagnosis not present

## 2022-06-03 DIAGNOSIS — Z5181 Encounter for therapeutic drug level monitoring: Secondary | ICD-10-CM | POA: Diagnosis not present

## 2022-06-03 MED ORDER — ESTRADIOL 0.0375 MG/24HR TD PTTW: MEDICATED_PATCH | TRANSDERMAL | 0 refills | Status: DC

## 2022-06-03 MED ORDER — GABAPENTIN 100 MG PO CAPS: ORAL_CAPSULE | ORAL | 2 refills | Status: DC

## 2022-06-10 ENCOUNTER — Ambulatory Visit (HOSPITAL_COMMUNITY)
Admission: RE | Admit: 2022-06-10 | Discharge: 2022-06-10 | Disposition: A | Discharge status: Home / Self Care | Payer: Medicare PPO | Source: Ambulatory Visit | Attending: Nurse Practitioner | Admitting: Nurse Practitioner

## 2022-06-10 DIAGNOSIS — Z1231 Encounter for screening mammogram for malignant neoplasm of breast: Secondary | ICD-10-CM | POA: Diagnosis not present

## 2022-06-24 ENCOUNTER — Other Ambulatory Visit: Payer: Self-pay | Admitting: Nurse Practitioner

## 2022-06-24 DIAGNOSIS — E7800 Pure hypercholesterolemia, unspecified: Secondary | ICD-10-CM

## 2022-06-24 DIAGNOSIS — I10 Essential (primary) hypertension: Secondary | ICD-10-CM

## 2022-07-07 ENCOUNTER — Telehealth: Payer: Self-pay

## 2022-07-07 NOTE — Telephone Encounter (Signed)
FYI. Pt calling to request refills be sent to [facility].   Has only been seen for PM sx mgmt. However, has appt for B&P and f/u on 09/16/2022.  Last mammo 06/10/2022-neg birads 1   Rx's sent to [facility] in 05/2022 for 3 month supply of gabapentin and estrogen patches. Pt reports that she is currently out of gabapentin and pharmacy is telling her that she needs to contact us for a refill.   Will contact pharmacy for clarification. They reported that they needed [place] to confirm her sig/dosing. Confirmed for pt to take two caps po qhs.   Pt notified that they have refills and they are getting rx ready for her via detailed msg per DPR.

## 2022-07-08 NOTE — Telephone Encounter (Signed)
Thank you for the message.  You may close this encounter.

## 2022-07-09 ENCOUNTER — Ambulatory Visit (INDEPENDENT_AMBULATORY_CARE_PROVIDER_SITE_OTHER): Payer: Medicare PPO

## 2022-07-09 VITALS — Ht <= 58 in | Wt 154.0 lb

## 2022-07-09 DIAGNOSIS — Z0000 Encounter for general adult medical examination without abnormal findings: Secondary | ICD-10-CM | POA: Diagnosis not present

## 2022-07-09 NOTE — Progress Notes (Signed)
Subjective:   Mary Nearing. Eilert is a 77 y.o. female who presents for an Initial Medicare Annual Wellness Visit. I connected with  Mary Wilkinson on 07/09/22 by a audio enabled telemedicine application and verified that I am speaking with the correct person using two identifiers.  Patient Location: Home  Provider Location: Home Office  I discussed the limitations of evaluation and management by telemedicine. The patient expressed understanding and agreed to proceed.  Review of Systems     Cardiac Risk Factors include: advanced age (>66men, >43 women);hypertension     Objective:    Today's Vitals   07/09/22 1046  Weight: 154 lb (69.9 kg)  Height: 4\' 9"  (1.448 m)   Body mass index is 33.33 kg/m.     07/09/2022   10:48 AM 05/13/2021   11:11 AM 11/27/2019   10:23 AM 06/11/2014    3:37 AM  Advanced Directives  Does Patient Have a Medical Advance Directive? Yes Yes Yes No;Yes  Type of Paramedic of [place];Living will [place];Living will  Living will  Does patient want to make changes to medical advance directive?   No - Patient declined   Copy of [place] in Chart? No - copy requested No - copy requested      Current Medications (verified) Outpatient Encounter Medications as of 07/09/2022  Medication Sig   atorvastatin (LIPITOR) 40 MG tablet TAKE 1 TABLET EVERY DAY   estradiol (DOTTI) 0.0375 MG/24HR APPLY 1 PATCH TOPICALLY TWICE A WEEK   gabapentin (NEURONTIN) 100 MG capsule Take two capsules (200 mg) by mouth at night to treat hot flashes.   lisinopril (ZESTRIL) 40 MG tablet TAKE 1 TABLET EVERY DAY   metoprolol tartrate (LOPRESSOR) 25 MG tablet Take 1 tablet (25 mg total) by mouth 2 (two) times daily.   predniSONE (DELTASONE) 10 MG tablet Take 1 tablet (10 mg total) by mouth daily with breakfast.   Saccharomyces boulardii (PROBIOTIC) 250 MG CAPS Take by mouth.   No facility-administered encounter medications on  file as of 07/09/2022.    Allergies (verified) Patient has no known allergies.   History: Past Medical History:  Diagnosis Date   Allergy    Arthritis    left knee   History of abnormal cervical Pap smear    Had Hyst due to abnormal paps per patient   Hyperlipidemia    Hypertension    Past Surgical History:  Procedure Laterality Date   ABDOMINAL HYSTERECTOMY     KNEE SURGERY     left   Family History  Problem Relation Age of Onset   Heart disease Mother    Kidney disease Father    Breast cancer Maternal Aunt    Colon cancer Neg Hx    Esophageal cancer Neg Hx    Stomach cancer Neg Hx    Social History   Socioeconomic History   Marital status: Married    Spouse name: Mikeal Hawthorne   Number of children: 4   Years of education: Not on file   Highest education level: Not on file  Occupational History   Occupation: Retired  Tobacco Use   Smoking status: Never   Smokeless tobacco: Never  Vaping Use   Vaping Use: Never used  Substance and Sexual Activity   Alcohol use: No    Alcohol/week: 0.0 standard drinks of alcohol   Drug use: No   Sexual activity: Not Currently    Birth control/protection: None    Comment: first intercourse <  18,<5 sexual partners, no STD  Other Topics Concern   Not on file  Social History Narrative   Married x 57 years in 2022.   11 grandchildren   5 great grandchildren.   Social Determinants of Health   Financial Resource Strain: Low Risk  (07/09/2022)   Overall Financial Resource Strain (CARDIA)    Difficulty of Paying Living Expenses: Not hard at all  Food Insecurity: No Food Insecurity (07/09/2022)   Hunger Vital Sign    Worried About Running Out of Food in the Last Year: Never true    Ran Out of Food in the Last Year: Never true  Transportation Needs: No Transportation Needs (07/09/2022)   PRAPARE - Administrator, Civil Service (Medical): No    Lack of Transportation (Non-Medical): No  Physical Activity: Insufficiently  Active (07/09/2022)   Exercise Vital Sign    Days of Exercise per Week: 3 days    Minutes of Exercise per Session: 30 min  Stress: No Stress Concern Present (07/09/2022)   [employer] of Occupational Health - Occupational Stress Questionnaire    Feeling of Stress : Not at all  Social Connections: Socially Integrated (07/09/2022)   Social Connection and Isolation Panel [NHANES]    Frequency of Communication with Friends and Family: More than three times a week    Frequency of Social Gatherings with Friends and Family: More than three times a week    Attends Religious Services: More than 4 times per year    Active Member of [employer] or Organizations: Yes    Attends Engineer, structural: More than 4 times per year    Marital Status: Married    Tobacco Counseling Counseling given: Not Answered   Clinical Intake:  Pre-visit preparation completed: Yes  Pain : No/denies pain     Nutritional Risks: None Diabetes: No  How often do you need to have someone help you when you read instructions, pamphlets, or other written materials from your doctor or pharmacy?: 1 - Never  Diabetic?no   Interpreter Needed?: No  Information entered by :: Renie Ora, LPN   Activities of Daily Living    07/09/2022   10:49 AM  In your present state of health, do you have any difficulty performing the following activities:  Hearing? 0  Vision? 0  Difficulty concentrating or making decisions? 0  Walking or climbing stairs? 0  Dressing or bathing? 0  Doing errands, shopping? 0  Preparing Food and eating ? N  Using the Toilet? N  In the past six months, have you accidently leaked urine? N  Do you have problems with loss of bowel control? N  Managing your Medications? N  Managing your Finances? N  Housekeeping or managing your Housekeeping? N    Patient Care Team: Sonny Masters, FNP as PCP - General (Family Medicine)  Indicate any recent Medical Services you may have received  from other than [facility] providers in the past year (date may be approximate).     Assessment:   This is a routine wellness examination for Mary Wilkinson.  Hearing/Vision screen Vision Screening - Comments:: Wears rx glasses - up to date with routine eye exams with  Dr.Lee  Dietary issues and exercise activities discussed: Current Exercise Habits: Home exercise routine, Type of exercise: walking, Time (Minutes): 30, Frequency (Times/Week): 3, Weekly Exercise (Minutes/Week): 90, Intensity: Mild, Exercise limited by: None identified   Goals Addressed             This Visit's  Progress    Exercise 3x per week (30 min per time)   On track    Increase exercise as tolerated. Would like to read more.       Depression Screen    07/09/2022   10:47 AM 05/18/2022   10:46 AM 04/28/2022    2:55 PM 11/16/2021   11:00 AM 08/14/2021   11:22 AM 06/16/2021   11:16 AM 05/15/2021    2:53 PM  PHQ 2/9 Scores  PHQ - 2 Score 0 0 0 0 0 0 0  PHQ- 9 Score 0 0 0  0 0 0    Fall Risk    07/09/2022   10:47 AM 05/18/2022   10:46 AM 04/28/2022    2:56 PM 08/14/2021   11:04 AM 06/16/2021   11:16 AM  Fall Risk   Falls in the past year? 0 0 0 0 0  Number falls in past yr: 0 0 0    Injury with Fall? 0 0 0    Risk for fall due to : No Fall Risks No Fall Risks No Fall Risks    Follow up Falls prevention discussed Falls evaluation completed Education provided      FALL RISK PREVENTION PERTAINING TO THE HOME:  Any stairs in or around the home? No  If so, are there any without handrails? No  Home free of loose throw rugs in walkways, pet beds, electrical cords, etc? Yes  Adequate lighting in your home to reduce risk of falls? Yes   ASSISTIVE DEVICES UTILIZED TO PREVENT FALLS:  Life alert? No  Use of a cane, walker or w/c? No  Grab bars in the bathroom? No  Shower chair or bench in shower? No  Elevated toilet seat or a handicapped toilet? No          07/09/2022   10:49 AM 05/13/2021   11:14 AM  6CIT Screen   What Year? 0 points 0 points  What month? 0 points 0 points  What time? 0 points 0 points  Count back from 20 0 points 0 points  Months in reverse 0 points 0 points  Repeat phrase 0 points 0 points  Total Score 0 points 0 points    Immunizations Immunization History  Administered Date(s) Administered   Hepatitis B 04/13/1996, 05/18/1996, 08/17/1996   Janssen (J&J) SARS-COV-2 Vaccination 06/18/2020   Pneumococcal Conjugate-13 10/12/2013   Pneumococcal Polysaccharide-23 10/23/2014   Td 09/14/2011   Tdap 09/14/2011   Flu Vaccine status: Due, Education has been provided regarding the importance of this vaccine. Advised may receive this vaccine at local pharmacy or Health Dept. Aware to provide a copy of the vaccination record if obtained from local pharmacy or Health Dept. Verbalized acceptance and understanding. TDAP status: Due, Education has been provided regarding the importance of this vaccine. Advised may receive this vaccine at local pharmacy or Health Dept. Aware to provide a copy of the vaccination record if obtained from local pharmacy or Health Dept. Verbalized acceptance and understanding.  Flu Vaccine status: Declined, Education has been provided regarding the importance of this vaccine but patient still declined. Advised may receive this vaccine at local pharmacy or Health Dept. Aware to provide a copy of the vaccination record if obtained from local pharmacy or Health Dept. Verbalized acceptance and understanding.  Pneumococcal vaccine status: Up to date  Covid-19 vaccine status: Completed vaccines  Qualifies for Shingles Vaccine? Yes   Zostavax completed No   Shingrix Completed?: No.    Education has been provided  regarding the importance of this vaccine. Patient has been advised to call insurance company to determine out of pocket expense if they have not yet received this vaccine. Advised may also receive vaccine at local pharmacy or Health Dept. Verbalized acceptance  and understanding.  Screening Tests Health Maintenance  Topic Date Due   COVID-19 Vaccine (2 - Janssen risk series) 07/16/2020   Medicare Annual Wellness (AWV)  05/13/2022   INFLUENZA VACCINE  09/12/2022 (Originally 01/12/2022)   Zoster Vaccines- Shingrix (1 of 2) 09/15/2022 (Originally 11/11/1964)   COLONOSCOPY (Pts 45-70yrs Insurance coverage will need to be confirmed)  11/17/2022 (Originally 02/02/2021)   MAMMOGRAM  06/11/2023   DEXA SCAN  06/20/2023   Pneumonia Vaccine 5+ Years old  Completed   Hepatitis C Screening  Completed   HPV VACCINES  Aged Out   DTaP/Tdap/Td  Discontinued    Health Maintenance  Health Maintenance Due  Topic Date Due   COVID-19 Vaccine (2 - Janssen risk series) 07/16/2020   Medicare Annual Wellness (AWV)  05/13/2022    Colorectal cancer screening: Type of screening: Cologuard. Completed 11/23/2021. Repeat every 3 years  Mammogram status: Completed 06/10/2022. Repeat every year  Bone Density status: Completed 06/19/2021. Results reflect: Bone density results: OSTEOPENIA. Repeat every 5 years.  Lung Cancer Screening: (Low Dose CT Chest recommended if Age 85-80 years, 30 pack-year currently smoking OR have quit w/in 15years.) does not qualify.   Lung Cancer Screening Referral: n/a  Additional Screening:  Hepatitis C Screening: does not qualify;   Vision Screening: Recommended annual ophthalmology exams for early detection of glaucoma and other disorders of the eye. Is the patient up to date with their annual eye exam?  Yes  Who is the provider or what is the name of the office in which the patient attends annual eye exams? Dr.Lee  If pt is not established with a provider, would they like to be referred to a provider to establish care? No .   Dental Screening: Recommended annual dental exams for proper oral hygiene  Community Resource Referral / Chronic Care Management: CRR required this visit?  No   CCM required this visit?  No      Plan:      I have personally reviewed and noted the following in the patient's chart:   Medical and social history Use of alcohol, tobacco or illicit drugs  Current medications and supplements including opioid prescriptions. Patient is not currently taking opioid prescriptions. Functional ability and status Nutritional status Physical activity Advanced directives List of other physicians Hospitalizations, surgeries, and ER visits in previous 12 months Vitals Screenings to include cognitive, depression, and falls Referrals and appointments  In addition, I have reviewed and discussed with patient certain preventive protocols, quality metrics, and best practice recommendations. A written personalized care plan for preventive services as well as general preventive health recommendations were provided to patient.     Daphane Shepherd, LPN   [id number]   Nurse Notes: Due TDAP Vaccine

## 2022-07-09 NOTE — Patient Instructions (Signed)
Mary Wilkinson , Thank you for taking time to come for your Medicare Wellness Visit. I appreciate your ongoing commitment to your health goals. Please review the following plan we discussed and let me know if I can assist you in the future.   These are the goals we discussed:  Goals      Exercise 3x per week (30 min per time)     Increase exercise as tolerated. Would like to read more.        This is a list of the screening recommended for you and due dates:  Health Maintenance  Topic Date Due   COVID-19 Vaccine (2 - Janssen risk series) 07/16/2020   Medicare Annual Wellness Visit  05/13/2022   Flu Shot  09/12/2022*   Zoster (Shingles) Vaccine (1 of 2) 09/15/2022*   Colon Cancer Screening  11/17/2022*   Mammogram  06/11/2023   DEXA scan (bone density measurement)  06/20/2023   Pneumonia Vaccine  Completed   Hepatitis C Screening: USPSTF Recommendation to screen - Ages 37-79 yo.  Completed   HPV Vaccine  Aged Out   DTaP/Tdap/Td vaccine  Discontinued  *Topic was postponed. The date shown is not the original due date.    Advanced directives: Please bring a copy of your health care power of attorney and living will to the office to be added to your chart at your convenience.   Conditions/risks identified: Aim for 30 minutes of exercise or brisk walking, 6-8 glasses of water, and 5 servings of fruits and vegetables each day.   Next appointment: Follow up in one year for your annual wellness visit    Preventive Care 65 Years and Older, Female Preventive care refers to lifestyle choices and visits with your health care provider that can promote health and wellness. What does preventive care include? A yearly physical exam. This is also called an annual well check. Dental exams once or twice a year. Routine eye exams. Ask your health care provider how often you should have your eyes checked. Personal lifestyle choices, including: Daily care of your teeth and gums. Regular physical  activity. Eating a healthy diet. Avoiding tobacco and drug use. Limiting alcohol use. Practicing safe sex. Taking low-dose aspirin every day. Taking vitamin and mineral supplements as recommended by your health care provider. What happens during an annual well check? The services and screenings done by your health care provider during your annual well check will depend on your age, overall health, lifestyle risk factors, and family history of disease. Counseling  Your health care provider may ask you questions about your: Alcohol use. Tobacco use. Drug use. Emotional well-being. Home and relationship well-being. Sexual activity. Eating habits. History of falls. Memory and ability to understand (cognition). Work and work Statistician. Reproductive health. Screening  You may have the following tests or measurements: Height, weight, and BMI. Blood pressure. Lipid and cholesterol levels. These may be checked every 5 years, or more frequently if you are over 84 years old. Skin check. Lung cancer screening. You may have this screening every year starting at age 14 if you have a 30-pack-year history of smoking and currently smoke or have quit within the past 15 years. Fecal occult blood test (FOBT) of the stool. You may have this test every year starting at age 58. Flexible sigmoidoscopy or colonoscopy. You may have a sigmoidoscopy every 5 years or a colonoscopy every 10 years starting at age 32. Hepatitis C blood test. Hepatitis B blood test. Sexually transmitted disease (STD) testing.  Diabetes screening. This is done by checking your blood sugar (glucose) after you have not eaten for a while (fasting). You may have this done every 1-3 years. Bone density scan. This is done to screen for osteoporosis. You may have this done starting at age 28. Mammogram. This may be done every 1-2 years. Talk to your health care provider about how often you should have regular mammograms. Talk with your  health care provider about your test results, treatment options, and if necessary, the need for more tests. Vaccines  Your health care provider may recommend certain vaccines, such as: Influenza vaccine. This is recommended every year. Tetanus, diphtheria, and acellular pertussis (Tdap, Td) vaccine. You may need a Td booster every 10 years. Zoster vaccine. You may need this after age 46. Pneumococcal 13-valent conjugate (PCV13) vaccine. One dose is recommended after age 18. Pneumococcal polysaccharide (PPSV23) vaccine. One dose is recommended after age 60. Talk to your health care provider about which screenings and vaccines you need and how often you need them. This information is not intended to replace advice given to you by your health care provider. Make sure you discuss any questions you have with your health care provider. Document Released: 06/27/2015 Document Revised: 02/18/2016 Document Reviewed: 04/01/2015 Elsevier Interactive Patient Education  2017 [employer] Prevention in the Home Falls can cause injuries. They can happen to people of all ages. There are many things you can do to make your home safe and to help prevent falls. What can I do on the outside of my home? Regularly fix the edges of walkways and driveways and fix any cracks. Remove anything that might make you trip as you walk through a door, such as a raised step or threshold. Trim any bushes or trees on the path to your home. Use bright outdoor lighting. Clear any walking paths of anything that might make someone trip, such as rocks or tools. Regularly check to see if handrails are loose or broken. Make sure that both sides of any steps have handrails. Any raised decks and porches should have guardrails on the edges. Have any leaves, snow, or ice cleared regularly. Use sand or salt on walking paths during winter. Clean up any spills in your garage right away. This includes oil or grease spills. What can I  do in the bathroom? Use night lights. Install grab bars by the toilet and in the tub and shower. Do not use towel bars as grab bars. Use non-skid mats or decals in the tub or shower. If you need to sit down in the shower, use a plastic, non-slip stool. Keep the floor dry. Clean up any water that spills on the floor as soon as it happens. Remove soap buildup in the tub or shower regularly. Attach bath mats securely with double-sided non-slip rug tape. Do not have throw rugs and other things on the floor that can make you trip. What can I do in the bedroom? Use night lights. Make sure that you have a light by your bed that is easy to reach. Do not use any sheets or blankets that are too big for your bed. They should not hang down onto the floor. Have a firm chair that has side arms. You can use this for support while you get dressed. Do not have throw rugs and other things on the floor that can make you trip. What can I do in the kitchen? Clean up any spills right away. Avoid walking on wet floors.  Keep items that you use a lot in easy-to-reach places. If you need to reach something above you, use a strong step stool that has a grab bar. Keep electrical cords out of the way. Do not use floor polish or wax that makes floors slippery. If you must use wax, use non-skid floor wax. Do not have throw rugs and other things on the floor that can make you trip. What can I do with my stairs? Do not leave any items on the stairs. Make sure that there are handrails on both sides of the stairs and use them. Fix handrails that are broken or loose. Make sure that handrails are as long as the stairways. Check any carpeting to make sure that it is firmly attached to the stairs. Fix any carpet that is loose or worn. Avoid having throw rugs at the top or bottom of the stairs. If you do have throw rugs, attach them to the floor with carpet tape. Make sure that you have a light switch at the top of the stairs  and the bottom of the stairs. If you do not have them, ask someone to add them for you. What else can I do to help prevent falls? Wear shoes that: Do not have high heels. Have rubber bottoms. Are comfortable and fit you well. Are closed at the toe. Do not wear sandals. If you use a stepladder: Make sure that it is fully opened. Do not climb a closed stepladder. Make sure that both sides of the stepladder are locked into place. Ask someone to hold it for you, if possible. Clearly mark and make sure that you can see: Any grab bars or handrails. First and last steps. Where the edge of each step is. Use tools that help you move around (mobility aids) if they are needed. These include: Canes. Walkers. Scooters. Crutches. Turn on the lights when you go into a dark area. Replace any light bulbs as soon as they burn out. Set up your furniture so you have a clear path. Avoid moving your furniture around. If any of your floors are uneven, fix them. If there are any pets around you, be aware of where they are. Review your medicines with your doctor. Some medicines can make you feel dizzy. This can increase your chance of falling. Ask your doctor what other things that you can do to help prevent falls. This information is not intended to replace advice given to you by your health care provider. Make sure you discuss any questions you have with your health care provider. Document Released: 03/27/2009 Document Revised: 11/06/2015 Document Reviewed: 07/05/2014 Elsevier Interactive Patient Education  2017 [employer].

## 2022-07-26 ENCOUNTER — Other Ambulatory Visit: Payer: Self-pay

## 2022-07-26 ENCOUNTER — Telehealth: Payer: Self-pay

## 2022-07-26 MED ORDER — ESTRADIOL 0.025 MG/24HR TD PTWK: 0.0250 mg | MEDICATED_PATCH | TRANSDERMAL | 1 refills | Status: DC

## 2022-07-26 MED ORDER — ESTRADIOL 0.0375 MG/24HR TD PTWK: 0.0375 mg | MEDICATED_PATCH | TRANSDERMAL | 1 refills | Status: DC

## 2022-07-26 NOTE — Telephone Encounter (Signed)
Patient called because she has unable to obtain her Est patches. Has been out for a week.  "I can't get it anywhere."  I spoke with her pharmacy and they confirmed a nationwide shortage and it is on backorder.  Please advise.

## 2022-07-26 NOTE — Telephone Encounter (Signed)
She can try Climara estradiol patch 0.0375 mg placed on skin ONCE WEEKLY.   She can have refills until her annual exam with me in April, 2024.

## 2022-07-26 NOTE — Telephone Encounter (Signed)
I initially sent the wrong dose on the patch in error. I called [facility] and spoke with pharmacy and they deleted it. Correct Rx was sent.  I left patient a detailed message in voicemail and advised checking with Pharmacy before she goes to pick it up.

## 2022-08-02 NOTE — Telephone Encounter (Signed)
Patient called to report when she picked up Rx that it was for transdermal estradiol patch 0.0375 mg twice weekly. She was confused because I had told her the new Rx would be once weekly.  I called and spoke with pharmacist who stated that patient's old Dotti Rx was still in queue and they received generic and filled that for patient.  She said that they are still having issues even getting the generic Dotti. She said patient may not be able to get it with her next fill.  She removed all the Dotti Rx from her chart and will plan to use the new Climara 0.0375 mg Rx in the future while they are having trouble getting Dotti.

## 2022-09-02 NOTE — Progress Notes (Signed)
77 y.o. [id number] Married [place] American female here for annual exam.    Patient is using ERT and gabapentin for menopausal treatment.  She is using ERT patch 0.0375 twice a week instead of once a week.  The twice weekly is causing bilateral breast tenderness, more right side than the left.  Nipple are tender.  No nipple discharge.  She did not have breast tenderness with use of the once a week prescription.   She does not feel any breast lumps.   Still having hot flashes, early morning hours and maybe one during the day.   Taking gabapentin 200 mg nightly.  Some first morning dizziness.     Denies hx stroke or TIA. No heart disease, angina or MI.  She has a tremor.   Not resting well.  Husband has cancer and is treating at [facility].   PCP:   Kari Baars, FNP  No LMP recorded. Patient has had a hysterectomy.           Sexually active: No.  The current method of family planning is status post hysterectomy.    Exercising: No.     Smoker:  no  Health Maintenance: Pap:  Years ago -- Hx of abnormal pap. Per patient had hyst due to abnormal pap smear.  History of abnormal Pap:  yes MMG:  06/10/22 Breast Density Category B, BI-RADS CATEGORY 1 neg Colonoscopy:  02/03/11 BMD:   02/20/14  Result  unsure result TDaP:  09/14/11 Gardasil:   no HIV:n/a Hep C: 11/11/17 NR Screening Labs:  PCP   reports that she has never smoked. She has never used smokeless tobacco. She reports that she does not drink alcohol and does not use drugs.  Past Medical History:  Diagnosis Date   Allergy    Arthritis    left knee   History of abnormal cervical Pap smear    Had Hyst due to abnormal paps per patient   Hyperlipidemia    Hypertension     Past Surgical History:  Procedure Laterality Date   ABDOMINAL HYSTERECTOMY     KNEE SURGERY     left    Current Outpatient Medications  Medication Sig Dispense Refill   atorvastatin (LIPITOR) 40 MG tablet TAKE 1 TABLET EVERY DAY 90  tablet 1   estradiol (CLIMARA - DOSED IN MG/24 HR) 0.0375 mg/24hr patch Place 1 patch (0.0375 mg total) onto the skin once a week. 4 patch 1   gabapentin (NEURONTIN) 100 MG capsule Take two capsules (200 mg) by mouth at night to treat hot flashes. 60 capsule 2   lisinopril (ZESTRIL) 40 MG tablet TAKE 1 TABLET EVERY DAY 90 tablet 1   metoprolol tartrate (LOPRESSOR) 25 MG tablet Take 1 tablet (25 mg total) by mouth 2 (two) times daily. 180 tablet 3   Saccharomyces boulardii (PROBIOTIC) 250 MG CAPS Take by mouth.     predniSONE (DELTASONE) 10 MG tablet Take 1 tablet (10 mg total) by mouth daily with breakfast. (Patient not taking: Reported on 09/16/2022) 6 tablet 10   No current facility-administered medications for this visit.    Family History  Problem Relation Age of Onset   Heart disease Mother    Kidney disease Father    Breast cancer Maternal Aunt    Colon cancer Neg Hx    Esophageal cancer Neg Hx    Stomach cancer Neg Hx     Review of Systems  All other systems reviewed and are negative.   Exam:  BP 124/76 (BP Location: Left Arm, Patient Position: Sitting, Cuff Size: Normal)   Pulse (!) 49   Ht 4\' 11"  (1.499 m)   Wt 151 lb (68.5 kg)   SpO2 97%   BMI 30.50 kg/m     General appearance: alert, cooperative and appears stated age Head: normocephalic, without obvious abnormality, atraumatic Neck: no adenopathy, supple, symmetrical, trachea midline and thyroid normal to inspection and palpation Lungs: clear to auscultation bilaterally Breasts: normal appearance, no masses or tenderness, No nipple retraction or dimpling, No nipple discharge or bleeding, No axillary adenopathy Heart: regular rate and rhythm Abdomen: soft, non-tender; no masses, no organomegaly Extremities: extremities normal, atraumatic, no cyanosis or edema Skin: skin color, texture, turgor normal. No rashes or lesions Lymph nodes: cervical, supraclavicular, and axillary nodes normal. Neurologic: grossly  normal  Pelvic: External genitalia:  no lesions              No abnormal inguinal nodes palpated.              Urethra:  normal appearing urethra with no masses, tenderness or lesions              Bartholins and Skenes: normal                 Vagina: normal appearing vagina with normal color and discharge, no lesions              Cervix: absent              Pap taken: no Bimanual Exam:  Uterus:  absent              Adnexa: no mass, fullness, tenderness              Rectal exam: yes.  Confirms.              Anus:  normal sphincter tone, no lesions  Chaperone was present for exam:  Warren Lacymily F, CMA  Assessment:   Well woman visit with gynecologic exam.  Menopausal symptoms.  Current use of ERT and Gabapentin. Status post total abdominal hysterectomy.   Plan: Mammogram screening discussed. Self breast awareness reviewed. Pap and HR HPV not indicated. Guidelines for Calcium, Vitamin D, regular exercise program including cardiovascular and weight bearing exercise. She wants to return to Climara 0.0375 mg once a week.  #12, RF 3.  She declines reducing the dosage.  We discussed WHI study and increased risk of stroke, DVT, and PE with estrogen therapy.  Will reduce Gabapentin to 100 mg po q hs.  #90, RF 3.  We reviewed side effects including dizziness, tolerance, and dependence on Gabapentin.  Follow up annually and prn.   After visit summary provided.   35 min  total time was spent for this patient encounter, including preparation, face-to-face counseling with the patient, coordination of care, and documentation of the encounter.

## 2022-09-16 ENCOUNTER — Encounter: Payer: Self-pay | Admitting: Obstetrics and Gynecology

## 2022-09-16 ENCOUNTER — Ambulatory Visit (INDEPENDENT_AMBULATORY_CARE_PROVIDER_SITE_OTHER): Payer: Medicare PPO | Admitting: Obstetrics and Gynecology

## 2022-09-16 VITALS — BP 124/76 | HR 49 | Ht 59.0 in | Wt 151.0 lb

## 2022-09-16 DIAGNOSIS — Z01419 Encounter for gynecological examination (general) (routine) without abnormal findings: Secondary | ICD-10-CM | POA: Diagnosis not present

## 2022-09-16 DIAGNOSIS — Z5181 Encounter for therapeutic drug level monitoring: Secondary | ICD-10-CM | POA: Diagnosis not present

## 2022-09-16 DIAGNOSIS — N951 Menopausal and female climacteric states: Secondary | ICD-10-CM

## 2022-09-16 MED ORDER — ESTRADIOL 0.0375 MG/24HR TD PTWK: 0.0375 mg | MEDICATED_PATCH | TRANSDERMAL | 3 refills | Status: DC

## 2022-09-16 MED ORDER — GABAPENTIN 100 MG PO CAPS: ORAL_CAPSULE | ORAL | 3 refills | Status: DC

## 2022-10-19 ENCOUNTER — Telehealth: Payer: Self-pay

## 2022-10-19 NOTE — Telephone Encounter (Signed)
Pt calling to report that pharmacy is telling her that she needed to contact provider regarding refills for her HRT patches.   Pt advised rx sent for 50yr at her B&P on 09/16/22. Pt voiced understanding. However, would like me to contact pharmacy.   I confirmed w/ pt that she is only using one patch weekly. She agreed.   Spoke w/ pharmacy and they confirmed that they have script on file and will get it ready for her.   Pt notified and voiced understanding. Will route to provider for final review and close encounter.

## 2022-11-11 ENCOUNTER — Other Ambulatory Visit: Payer: Self-pay

## 2022-11-11 DIAGNOSIS — N951 Menopausal and female climacteric states: Secondary | ICD-10-CM

## 2022-11-11 MED ORDER — ESTRADIOL 0.0375 MG/24HR TD PTWK
0.0375 mg | MEDICATED_PATCH | TRANSDERMAL | 2 refills | Status: DC
Start: 2022-11-11 — End: 2023-10-12

## 2022-11-11 NOTE — Telephone Encounter (Signed)
BS pt calling d/t [facility] pharmacy giving her trouble again with her HRT patches. She desires to switch to an alternate pharmacy. Desires to try [facility] in [place].  Rx for a year sent in 09/2022. Rx sent. Will route to provider for review and close.

## 2022-11-17 ENCOUNTER — Ambulatory Visit: Payer: Medicare PPO | Admitting: Family Medicine

## 2022-11-17 ENCOUNTER — Ambulatory Visit: Payer: Medicare PPO | Admitting: Nurse Practitioner

## 2022-11-17 ENCOUNTER — Encounter: Payer: Self-pay | Admitting: Family Medicine

## 2022-11-17 VITALS — BP 140/78 | HR 61 | Temp 97.9°F | Ht 59.0 in | Wt 148.8 lb

## 2022-11-17 DIAGNOSIS — Z6830 Body mass index (BMI) 30.0-30.9, adult: Secondary | ICD-10-CM

## 2022-11-17 DIAGNOSIS — E7800 Pure hypercholesterolemia, unspecified: Secondary | ICD-10-CM | POA: Diagnosis not present

## 2022-11-17 DIAGNOSIS — N951 Menopausal and female climacteric states: Secondary | ICD-10-CM | POA: Diagnosis not present

## 2022-11-17 DIAGNOSIS — I10 Essential (primary) hypertension: Secondary | ICD-10-CM

## 2022-11-17 MED ORDER — LISINOPRIL 40 MG PO TABS
40.0000 mg | ORAL_TABLET | Freq: Every day | ORAL | 1 refills | Status: DC
Start: 2022-11-17 — End: 2022-11-19

## 2022-11-17 MED ORDER — ATORVASTATIN CALCIUM 40 MG PO TABS
40.0000 mg | ORAL_TABLET | Freq: Every day | ORAL | 1 refills | Status: DC
Start: 2022-11-17 — End: 2022-11-19

## 2022-11-17 NOTE — Progress Notes (Addendum)
Subjective:  Patient ID: Mary Wilkinson, female    DOB: Jan 19, 1946, 77 y.o.   MRN: [id number]  Patient Care Team: Sonny Masters, FNP as PCP - General (Family Medicine) Patton Salles, MD as Consulting Physician (Obstetrics and Gynecology)   Chief Complaint:  Establish Care (Previous JE patient ) and Medical Management of Chronic Issues   HPI: Mary Wilkinson is a 77 y.o. female presenting on 11/17/2022 for Establish Care (Previous JE patient ) and Medical Management of Chronic Issues    1. Pure hypercholesterolemia Compliant with medications - Yes Current medications - atorvastatin Side effects from medications - No Diet - healthy Exercise - active daily, walking    2. Primary hypertension Complaint with meds - Yes Current Medications - lisinopril, lopressor  Checking BP at home - no Exercising Regularly - Yes Watching Salt intake - Yes Pertinent ROS:  Headache - No Fatigue - No Visual Disturbances - No Chest pain - No Dyspnea - No Palpitations - No LE edema - No They report good compliance with medications and can restate their regimen by memory. No medication side effects.  BP Readings from Last 3 Encounters:  11/17/22 (!) 140/78  09/16/22 124/76  06/03/22 126/76     3. Post menopausal syndrome Currently managed by GYN. Is on gabapentin for hot flashes and estrogen patches weekly. Symptoms are well managed with above medications. Discussed risks of HRT with pt, she will discuss with her GYN.   4. BMI 30.0-30.9,adult Has been doing well with diet and exercise. Down 5 lbs.     Relevant past medical, surgical, family, and social history reviewed and updated as indicated.  Allergies and medications reviewed and updated. Data reviewed: Chart in Epic.   Past Medical History:  Diagnosis Date   Allergy    Arthritis    left knee   History of abnormal cervical Pap smear    Had Hyst due to abnormal paps per patient   Hyperlipidemia     Hypertension     Past Surgical History:  Procedure Laterality Date   ABDOMINAL HYSTERECTOMY     KNEE SURGERY     left    Social History   Socioeconomic History   Marital status: Married    Spouse name: Remi Deter   Number of children: 4   Years of education: Not on file   Highest education level: Not on file  Occupational History   Occupation: Retired  Tobacco Use   Smoking status: Never   Smokeless tobacco: Never  Vaping Use   Vaping Use: Never used  Substance and Sexual Activity   Alcohol use: No    Alcohol/week: 0.0 standard drinks of alcohol   Drug use: No   Sexual activity: Not Currently    Birth control/protection: None    Comment: first intercourse <18,<5 sexual partners, no STD  Other Topics Concern   Not on file  Social History Narrative   Married x 57 years in 2022.   11 grandchildren   5 great grandchildren.   Social Determinants of Health   Financial Resource Strain: Low Risk  (07/09/2022)   Overall Financial Resource Strain (CARDIA)    Difficulty of Paying Living Expenses: Not hard at all  Food Insecurity: No Food Insecurity (07/09/2022)   Hunger Vital Sign    Worried About Running Out of Food in the Last Year: Never true    Ran Out of Food in the Last Year: Never true  Transportation Needs: No Transportation  Needs (07/09/2022)   PRAPARE - Administrator, Civil Service (Medical): No    Lack of Transportation (Non-Medical): No  Physical Activity: Insufficiently Active (07/09/2022)   Exercise Vital Sign    Days of Exercise per Week: 3 days    Minutes of Exercise per Session: 30 min  Stress: No Stress Concern Present (07/09/2022)   [employer] of Occupational Health - Occupational Stress Questionnaire    Feeling of Stress : Not at all  Social Connections: Socially Integrated (07/09/2022)   Social Connection and Isolation Panel [NHANES]    Frequency of Communication with Friends and Family: More than three times a week    Frequency of  Social Gatherings with Friends and Family: More than three times a week    Attends Religious Services: More than 4 times per year    Active Member of [employer] or Organizations: Yes    Attends Engineer, structural: More than 4 times per year    Marital Status: Married  Catering manager Violence: Not At Risk (07/09/2022)   Humiliation, Afraid, Rape, and Kick questionnaire    Fear of Current or Ex-Partner: No    Emotionally Abused: No    Physically Abused: No    Sexually Abused: No    Outpatient Encounter Medications as of 11/17/2022  Medication Sig   estradiol (CLIMARA - DOSED IN MG/24 HR) 0.0375 mg/24hr patch Place 1 patch (0.0375 mg total) onto the skin once a week.   gabapentin (NEURONTIN) 100 MG capsule Take one capsules (100 mg) by mouth at night to treat hot flashes.   metoprolol tartrate (LOPRESSOR) 25 MG tablet Take 1 tablet (25 mg total) by mouth 2 (two) times daily.   Saccharomyces boulardii (PROBIOTIC) 250 MG CAPS Take by mouth.   [DISCONTINUED] atorvastatin (LIPITOR) 40 MG tablet TAKE 1 TABLET EVERY DAY   [DISCONTINUED] lisinopril (ZESTRIL) 40 MG tablet TAKE 1 TABLET EVERY DAY   atorvastatin (LIPITOR) 40 MG tablet Take 1 tablet (40 mg total) by mouth daily.   lisinopril (ZESTRIL) 40 MG tablet Take 1 tablet (40 mg total) by mouth daily.   [DISCONTINUED] predniSONE (DELTASONE) 10 MG tablet Take 1 tablet (10 mg total) by mouth daily with breakfast.   No facility-administered encounter medications on file as of 11/17/2022.    No Known Allergies  Review of Systems  Constitutional:  Negative for activity change, appetite change, chills, diaphoresis, fatigue, fever and unexpected weight change.  HENT: Negative.    Eyes: Negative.  Negative for photophobia and visual disturbance.  Respiratory:  Negative for cough, chest tightness and shortness of breath.   Cardiovascular:  Negative for chest pain, palpitations and leg swelling.  Gastrointestinal:  Negative for abdominal pain,  blood in stool, constipation, diarrhea, nausea and vomiting.  Endocrine: Negative.  Negative for polydipsia, polyphagia and polyuria.  Genitourinary:  Negative for decreased urine volume, difficulty urinating, dysuria, frequency and urgency.  Musculoskeletal:  Negative for arthralgias and myalgias.  Skin: Negative.   Allergic/Immunologic: Negative.   Neurological:  Negative for dizziness, tremors, seizures, syncope, facial asymmetry, speech difficulty, weakness, light-headedness, numbness and headaches.  Hematological: Negative.   Psychiatric/Behavioral:  Negative for confusion, hallucinations, sleep disturbance and suicidal ideas.   All other systems reviewed and are negative.       Objective:  BP (!) 140/78 (BP Location: Left Arm, Cuff Size: Normal)   Pulse 61   Temp 97.9 F (36.6 C) (Temporal)   Ht 4\' 11"  (1.499 m)   Wt 148 lb 12.8 oz (67.5  kg)   SpO2 96%   BMI 30.05 kg/m    Wt Readings from Last 3 Encounters:  11/17/22 148 lb 12.8 oz (67.5 kg)  09/16/22 151 lb (68.5 kg)  07/09/22 154 lb (69.9 kg)    Physical Exam Vitals and nursing note reviewed.  Constitutional:      General: She is not in acute distress.    Appearance: Normal appearance. She is well-developed and well-groomed. She is obese. She is not ill-appearing, toxic-appearing or diaphoretic.     Comments: Appears younger than stated age  HENT:     Head: Normocephalic and atraumatic.     Jaw: There is normal jaw occlusion.     Right Ear: Hearing normal.     Left Ear: Hearing normal.     Nose: Nose normal.     Mouth/Throat:     Lips: Pink.     Mouth: Mucous membranes are moist.     Pharynx: Oropharynx is clear. Uvula midline.  Eyes:     General: Lids are normal.     Extraocular Movements: Extraocular movements intact.     Conjunctiva/sclera: Conjunctivae normal.     Pupils: Pupils are equal, round, and reactive to light.  Neck:     Thyroid: No thyroid mass, thyromegaly or thyroid tenderness.      Vascular: No carotid bruit or JVD.     Trachea: Trachea and phonation normal.  Cardiovascular:     Rate and Rhythm: Normal rate and regular rhythm.     Chest Wall: PMI is not displaced.     Pulses: Normal pulses.     Heart sounds: Normal heart sounds. No murmur heard.    No friction rub. No gallop.  Pulmonary:     Effort: Pulmonary effort is normal. No respiratory distress.     Breath sounds: Normal breath sounds. No wheezing.  Abdominal:     General: Bowel sounds are normal. There is no distension or abdominal bruit.     Palpations: Abdomen is soft. There is no hepatomegaly or splenomegaly.     Tenderness: There is no abdominal tenderness. There is no right CVA tenderness or left CVA tenderness.     Hernia: No hernia is present.  Musculoskeletal:        General: Normal range of motion.     Cervical back: Normal range of motion and neck supple.     Right lower leg: No edema.     Left lower leg: No edema.  Lymphadenopathy:     Cervical: No cervical adenopathy.  Skin:    General: Skin is warm and dry.     Capillary Refill: Capillary refill takes less than 2 seconds.     Coloration: Skin is not cyanotic, jaundiced or pale.     Findings: No rash.  Neurological:     General: No focal deficit present.     Mental Status: She is alert and oriented to person, place, and time.     Sensory: Sensation is intact.     Motor: Motor function is intact.     Coordination: Coordination is intact.     Gait: Gait is intact.     Deep Tendon Reflexes: Reflexes are normal and symmetric.  Psychiatric:        Attention and Perception: Attention and perception normal.        Mood and Affect: Mood and affect normal.        Speech: Speech normal.        Behavior: Behavior normal. Behavior is cooperative.  Thought Content: Thought content normal.        Cognition and Memory: Cognition and memory normal.        Judgment: Judgment normal.     Results for orders placed or performed in visit on  05/18/22  CBC with Differential/Platelet  Result Value Ref Range   WBC 4.9 3.4 - 10.8 x10E3/uL   RBC 4.90 3.77 - 5.28 x10E6/uL   Hemoglobin 12.9 11.1 - 15.9 g/dL   Hematocrit 09.8 11.9 - 46.6 %   MCV 84 79 - 97 fL   MCH 26.3 (L) 26.6 - 33.0 pg   MCHC 31.3 (L) 31.5 - 35.7 g/dL   RDW 14.7 82.9 - 56.2 %   Platelets 263 150 - 450 x10E3/uL   Neutrophils 56 Not Estab. %   Lymphs 35 Not Estab. %   Monocytes 9 Not Estab. %   Eos 0 Not Estab. %   Basos 0 Not Estab. %   Neutrophils Absolute 2.7 1.4 - 7.0 x10E3/uL   Lymphocytes Absolute 1.7 0.7 - 3.1 x10E3/uL   Monocytes Absolute 0.5 0.1 - 0.9 x10E3/uL   EOS (ABSOLUTE) 0.0 0.0 - 0.4 x10E3/uL   Basophils Absolute 0.0 0.0 - 0.2 x10E3/uL   Immature Granulocytes 0 Not Estab. %   Immature Grans (Abs) 0.0 0.0 - 0.1 x10E3/uL  CMP14+EGFR  Result Value Ref Range   Glucose 87 70 - 99 mg/dL   BUN 8 8 - 27 mg/dL   Creatinine, Ser 1.30 0.57 - 1.00 mg/dL   eGFR 62 >86 VH/QIO/9.62   BUN/Creatinine Ratio 8 (L) 12 - 28   Sodium 140 134 - 144 mmol/L   Potassium 4.3 3.5 - 5.2 mmol/L   Chloride 103 96 - 106 mmol/L   CO2 23 20 - 29 mmol/L   Calcium 9.6 8.7 - 10.3 mg/dL   Total Protein 7.0 6.0 - 8.5 g/dL   Albumin 4.8 3.8 - 4.8 g/dL   Globulin, Total 2.2 1.5 - 4.5 g/dL   Albumin/Globulin Ratio 2.2 1.2 - 2.2   Bilirubin Total 1.1 0.0 - 1.2 mg/dL   Alkaline Phosphatase 83 44 - 121 IU/L   AST 22 0 - 40 IU/L   ALT 21 0 - 32 IU/L  Lipid panel  Result Value Ref Range   Cholesterol, Total 202 (H) 100 - 199 mg/dL   Triglycerides 75 0 - 149 mg/dL   HDL 65 >95 mg/dL   VLDL Cholesterol Cal 14 5 - 40 mg/dL   LDL Chol Calc (NIH) 284 (H) 0 - 99 mg/dL   Chol/HDL Ratio 3.1 0.0 - 4.4 ratio  TSH  Result Value Ref Range   TSH 0.742 0.450 - 4.500 uIU/mL       Pertinent labs & imaging results that were available during my care of the patient were reviewed by me and considered in my medical decision making.  Assessment & Plan:  Raegann was seen today for  establish care and medical management of chronic issues.  Diagnoses and all orders for this visit:  Primary hypertension BP well controlled. Changes were not made in regimen today. Goal BP is 130/80. Pt aware to report any persistent high or low readings. DASH diet and exercise encouraged. Exercise at least 150 minutes per week and increase as tolerated. Goal BMI > 25. Stress management encouraged. Avoid nicotine and tobacco product use. Avoid excessive alcohol and NSAID's. Avoid more than 2000 mg of sodium daily. Medications as prescribed. Follow up as scheduled.  -     lisinopril (ZESTRIL)  40 MG tablet; Take 1 tablet (40 mg total) by mouth daily. -     CMP14+EGFR -     CBC with Differential/Platelet -     Lipid panel -     Thyroid Panel With TSH  Pure hypercholesterolemia Diet encouraged - increase intake of fresh fruits and vegetables, increase intake of lean proteins. Bake, broil, or grill foods. Avoid fried, greasy, and fatty foods. Avoid fast foods. Increase intake of fiber-rich whole grains. Exercise encouraged - at least 150 minutes per week and advance as tolerated.  Goal BMI < 25. Continue medications as prescribed. Follow up in 3-6 months as discussed.  -     atorvastatin (LIPITOR) 40 MG tablet; Take 1 tablet (40 mg total) by mouth daily. -     CMP14+EGFR -     Lipid panel  Post menopausal syndrome Pt aware to discuss decreasing or stopping her HRT with GYN as her blood pressure is starting to increase. DEXA 06/2021 unremarkable. Will check labs today.  -     Thyroid Panel With TSH -     VITAMIN D 25 Hydroxy (Vit-D Deficiency, Fractures)  BMI 30.0-30.9,adult Diet and exercise encouraged. Has lost weight since last visit. Labs pending.  -     CMP14+EGFR -     CBC with Differential/Platelet -     Lipid panel -     Thyroid Panel With TSH -     VITAMIN D 25 Hydroxy (Vit-D Deficiency, Fractures)     Continue all other maintenance medications.  Follow up plan: Return in  about 6 months (around 05/19/2023) for chronic follow up.   Continue healthy lifestyle choices, including diet (rich in fruits, vegetables, and lean proteins, and low in salt and simple carbohydrates) and exercise (at least 30 minutes of moderate physical activity daily).  Educational handout given for health maintenance   The above assessment and management plan was discussed with the patient. The patient verbalized understanding of and has agreed to the management plan. Patient is aware to call the clinic if they develop any new symptoms or if symptoms persist or worsen. Patient is aware when to return to the clinic for a follow-up visit. Patient educated on when it is appropriate to go to the emergency department.   Kari Baars, FNP-C [facility] [phone]

## 2022-11-18 LAB — CBC WITH DIFFERENTIAL/PLATELET
Basophils Absolute: 0 10*3/uL (ref 0.0–0.2)
Basos: 0 %
EOS (ABSOLUTE): 0 10*3/uL (ref 0.0–0.4)
Eos: 1 %
Hematocrit: 39.2 % (ref 34.0–46.6)
Hemoglobin: 12.8 g/dL (ref 11.1–15.9)
Immature Grans (Abs): 0 10*3/uL (ref 0.0–0.1)
Immature Granulocytes: 0 %
Lymphocytes Absolute: 1.5 10*3/uL (ref 0.7–3.1)
Lymphs: 43 %
MCH: 27.8 pg (ref 26.6–33.0)
MCHC: 32.7 g/dL (ref 31.5–35.7)
MCV: 85 fL (ref 79–97)
Monocytes Absolute: 0.3 10*3/uL (ref 0.1–0.9)
Monocytes: 10 %
Neutrophils Absolute: 1.5 10*3/uL (ref 1.4–7.0)
Neutrophils: 46 %
Platelets: 237 10*3/uL (ref 150–450)
RBC: 4.61 x10E6/uL (ref 3.77–5.28)
RDW: 13 % (ref 11.7–15.4)
WBC: 3.4 10*3/uL (ref 3.4–10.8)

## 2022-11-18 LAB — CMP14+EGFR
ALT: 18 IU/L (ref 0–32)
AST: 23 IU/L (ref 0–40)
Albumin/Globulin Ratio: 1.8 (ref 1.2–2.2)
Albumin: 4.4 g/dL (ref 3.8–4.8)
Alkaline Phosphatase: 74 IU/L (ref 44–121)
BUN/Creatinine Ratio: 9 — ABNORMAL LOW (ref 12–28)
BUN: 8 mg/dL (ref 8–27)
Bilirubin Total: 0.8 mg/dL (ref 0.0–1.2)
CO2: 20 mmol/L (ref 20–29)
Calcium: 9.4 mg/dL (ref 8.7–10.3)
Chloride: 103 mmol/L (ref 96–106)
Creatinine, Ser: 0.91 mg/dL (ref 0.57–1.00)
Globulin, Total: 2.5 g/dL (ref 1.5–4.5)
Glucose: 91 mg/dL (ref 70–99)
Potassium: 4.3 mmol/L (ref 3.5–5.2)
Sodium: 140 mmol/L (ref 134–144)
Total Protein: 6.9 g/dL (ref 6.0–8.5)
eGFR: 66 mL/min/{1.73_m2} (ref >59)

## 2022-11-18 LAB — VITAMIN D 25 HYDROXY (VIT D DEFICIENCY, FRACTURES): Vit D, 25-Hydroxy: 22.4 ng/mL — ABNORMAL LOW (ref 30.0–100.0)

## 2022-11-18 LAB — LIPID PANEL
Chol/HDL Ratio: 2.7 ratio (ref 0.0–4.4)
Cholesterol, Total: 166 mg/dL (ref 100–199)
HDL: 62 mg/dL (ref >39)
LDL Chol Calc (NIH): 93 mg/dL (ref 0–99)
Triglycerides: 55 mg/dL (ref 0–149)
VLDL Cholesterol Cal: 11 mg/dL (ref 5–40)

## 2022-11-18 LAB — THYROID PANEL WITH TSH
Free Thyroxine Index: 2.5 (ref 1.2–4.9)
T3 Uptake Ratio: 27 % (ref 24–39)
T4, Total: 9.2 ug/dL (ref 4.5–12.0)
TSH: 0.623 u[IU]/mL (ref 0.450–4.500)

## 2022-11-19 ENCOUNTER — Telehealth: Payer: Self-pay | Admitting: *Deleted

## 2022-11-19 DIAGNOSIS — E7800 Pure hypercholesterolemia, unspecified: Secondary | ICD-10-CM

## 2022-11-19 DIAGNOSIS — I10 Essential (primary) hypertension: Secondary | ICD-10-CM

## 2022-11-19 MED ORDER — ATORVASTATIN CALCIUM 40 MG PO TABS
40.0000 mg | ORAL_TABLET | Freq: Every day | ORAL | 1 refills | Status: DC
Start: 2022-11-19 — End: 2023-04-18

## 2022-11-19 MED ORDER — LISINOPRIL 40 MG PO TABS
40.0000 mg | ORAL_TABLET | Freq: Every day | ORAL | 1 refills | Status: DC
Start: 2022-11-19 — End: 2023-04-25

## 2022-11-19 NOTE — Telephone Encounter (Signed)
RFs sent. 

## 2022-12-13 ENCOUNTER — Telehealth: Payer: Self-pay | Admitting: Family Medicine

## 2022-12-13 MED ORDER — METOPROLOL TARTRATE 25 MG PO TABS: 25.0000 mg | ORAL_TABLET | Freq: Two times a day (BID) | ORAL | 1 refills | Status: DC

## 2022-12-13 NOTE — Telephone Encounter (Signed)
  Prescription Request  12/13/2022  Is this a "Controlled Substance" medicine? metoprolol tartrate (LOPRESSOR) 25 MG tablet   Have you seen your PCP in the last 2 weeks? No 11/17/2022 to establish with [facility]  If YES, route message to pool  -  If NO, patient needs to be scheduled for appointment.  What is the name of the medication or equipment? metoprolol tartrate (LOPRESSOR) 25 MG tablet   Have you contacted your pharmacy to request a refill? no   Which pharmacy would you like this sent to? [facility]   Patient notified that their request is being sent to the clinical staff for review and that they should receive a response within 2 business days.

## 2022-12-13 NOTE — Telephone Encounter (Signed)
Aware refill sent pharmacy

## 2023-03-02 ENCOUNTER — Other Ambulatory Visit: Payer: Self-pay

## 2023-03-02 ENCOUNTER — Ambulatory Visit: Payer: Medicare PPO

## 2023-03-02 VITALS — BP 146/84 | HR 60

## 2023-03-02 DIAGNOSIS — Z0130 Encounter for examination of blood pressure without abnormal findings: Secondary | ICD-10-CM

## 2023-03-02 NOTE — Progress Notes (Signed)
Patient here today for blood pressure check.  She had been checking her blood pressure at home and her readings had been good so she had stopped taking the night time Metoprolol and was only taking once per day.  She noticed yesterday that her blood pressure was elevated.    Today's reading was 158/83, pulse 60.  She sat for a few minutes and we rechecked blood pressure at 146/84.  Patient will start taking her night time Metoprolol again and if readings do not improve she will call to make an appointment with you.  I scheduled her 6 month follow up with you in December.

## 2023-03-09 ENCOUNTER — Telehealth: Payer: Self-pay | Admitting: Family Medicine

## 2023-04-16 ENCOUNTER — Other Ambulatory Visit: Payer: Self-pay | Admitting: Family Medicine

## 2023-04-16 DIAGNOSIS — E7800 Pure hypercholesterolemia, unspecified: Secondary | ICD-10-CM

## 2023-04-25 ENCOUNTER — Other Ambulatory Visit: Payer: Self-pay | Admitting: Family Medicine

## 2023-04-25 DIAGNOSIS — I10 Essential (primary) hypertension: Secondary | ICD-10-CM

## 2023-05-17 ENCOUNTER — Ambulatory Visit: Payer: Medicare PPO | Admitting: Family Medicine

## 2023-05-17 ENCOUNTER — Encounter: Payer: Self-pay | Admitting: Family Medicine

## 2023-05-17 VITALS — BP 144/78 | HR 53 | Temp 97.4°F | Ht 59.0 in | Wt 154.4 lb

## 2023-05-17 DIAGNOSIS — E559 Vitamin D deficiency, unspecified: Secondary | ICD-10-CM

## 2023-05-17 DIAGNOSIS — Z1211 Encounter for screening for malignant neoplasm of colon: Secondary | ICD-10-CM | POA: Diagnosis not present

## 2023-05-17 DIAGNOSIS — E7800 Pure hypercholesterolemia, unspecified: Secondary | ICD-10-CM

## 2023-05-17 DIAGNOSIS — I10 Essential (primary) hypertension: Secondary | ICD-10-CM | POA: Diagnosis not present

## 2023-05-17 DIAGNOSIS — Z6830 Body mass index (BMI) 30.0-30.9, adult: Secondary | ICD-10-CM

## 2023-05-17 DIAGNOSIS — N951 Menopausal and female climacteric states: Secondary | ICD-10-CM | POA: Diagnosis not present

## 2023-05-17 DIAGNOSIS — Z1212 Encounter for screening for malignant neoplasm of rectum: Secondary | ICD-10-CM | POA: Diagnosis not present

## 2023-05-17 NOTE — Progress Notes (Signed)
Subjective:  Patient ID: Mary Wilkinson. Splitt, female    DOB: [birth year], 77 y.o.   MRN: [id number]  Patient Care Team: Sonny Masters, FNP as PCP - General (Family Medicine) Patton Salles, MD as Consulting Physician (Obstetrics and Gynecology)   Chief Complaint:  Medical Management of Chronic Issues (6 month follow up )   HPI: Mary Wilkinson is a 77 y.o. female presenting on 05/17/2023 for Medical Management of Chronic Issues (6 month follow up )   Discussed the use of AI scribe software for clinical note transcription with the patient, who gave verbal consent to proceed.  History of Present Illness   Mary Wilkinson, a patient with a history of hypertension, hyperlipidemia, and hormone replacement therapy, presents for a routine follow-up. She reports no new health concerns and denies experiencing chest pain, leg swelling, or visual changes. She notes that her blood pressure has been well-controlled, although it tends to rise during clinic visits.  She continues to use her estradiol patch without any issues. She denies any muscle aches, pains, generalized fatigue, or weakness associated with her cholesterol medication. She also reports taking vitamin D daily, rather than weekly, at a dose of 2000 units.  The patient mentions occasional dizziness and a feeling of needing to pause upon standing up quickly or turning fast, which she attributes to her blood pressure medications, metoprolol and lisinopril. However, these symptoms resolve after a few moments of standing still.  She has previously undergone a colonoscopy at [facility] in [place] and expresses no preference for a different provider for her next procedure. She also mentions that she has a six-month supply of her medications at home and does not require any refills at this time.          Relevant past medical, surgical, family, and social history reviewed and updated as indicated.  Allergies and medications reviewed and  updated. Data reviewed: Chart in Epic.   Past Medical History:  Diagnosis Date   Allergy    Arthritis    left knee   History of abnormal cervical Pap smear    Had Hyst due to abnormal paps per patient   Hyperlipidemia    Hypertension     Past Surgical History:  Procedure Laterality Date   ABDOMINAL HYSTERECTOMY     KNEE SURGERY     left    Social History   Socioeconomic History   Marital status: Married    Spouse name: Remi Deter   Number of children: 4   Years of education: Not on file   Highest education level: Not on file  Occupational History   Occupation: Retired  Tobacco Use   Smoking status: Never   Smokeless tobacco: Never  Vaping Use   Vaping status: Never Used  Substance and Sexual Activity   Alcohol use: No    Alcohol/week: 0.0 standard drinks of alcohol   Drug use: No   Sexual activity: Not Currently    Birth control/protection: None    Comment: first intercourse <18,<5 sexual partners, no STD  Other Topics Concern   Not on file  Social History Narrative   Married x 57 years in 2022.   11 grandchildren   5 great grandchildren.   Social Determinants of Health   Financial Resource Strain: Low Risk  (07/09/2022)   Overall Financial Resource Strain (CARDIA)    Difficulty of Paying Living Expenses: Not hard at all  Food Insecurity: No Food Insecurity (07/09/2022)   Hunger Vital Sign  Worried About Programme researcher, broadcasting/film/video in the Last Year: Never true    Ran Out of Food in the Last Year: Never true  Transportation Needs: No Transportation Needs (07/09/2022)   PRAPARE - Administrator, Civil Service (Medical): No    Lack of Transportation (Non-Medical): No  Physical Activity: Insufficiently Active (07/09/2022)   Exercise Vital Sign    Days of Exercise per Week: 3 days    Minutes of Exercise per Session: 30 min  Stress: No Stress Concern Present (07/09/2022)   [employer] of Occupational Health - Occupational Stress Questionnaire     Feeling of Stress : Not at all  Social Connections: Socially Integrated (07/09/2022)   Social Connection and Isolation Panel [NHANES]    Frequency of Communication with Friends and Family: More than three times a week    Frequency of Social Gatherings with Friends and Family: More than three times a week    Attends Religious Services: More than 4 times per year    Active Member of [employer] or Organizations: Yes    Attends Engineer, structural: More than 4 times per year    Marital Status: Married  Catering manager Violence: Not At Risk (07/09/2022)   Humiliation, Afraid, Rape, and Kick questionnaire    Fear of Current or Ex-Partner: No    Emotionally Abused: No    Physically Abused: No    Sexually Abused: No    Outpatient Encounter Medications as of 05/17/2023  Medication Sig   atorvastatin (LIPITOR) 40 MG tablet TAKE 1 TABLET EVERY DAY   Cholecalciferol (VITAMIN D) 50 MCG (2000 UT) CAPS Take 2,000 Units by mouth daily.   estradiol (CLIMARA - DOSED IN MG/24 HR) 0.0375 mg/24hr patch Place 1 patch (0.0375 mg total) onto the skin once a week.   lisinopril (ZESTRIL) 40 MG tablet Take 1 tablet (40 mg total) by mouth daily. **NEEDS TO BE SEEN BEFORE NEXT REFILL**   metoprolol tartrate (LOPRESSOR) 25 MG tablet Take 1 tablet (25 mg total) by mouth 2 (two) times daily.   Omega-3 Fatty Acids (FISH OIL) 1200 MG CAPS Take 1,200 mg by mouth daily.   Saccharomyces boulardii (PROBIOTIC) 250 MG CAPS Take by mouth.   [DISCONTINUED] gabapentin (NEURONTIN) 100 MG capsule Take one capsules (100 mg) by mouth at night to treat hot flashes.   No facility-administered encounter medications on file as of 05/17/2023.    No Known Allergies  Pertinent ROS per HPI, otherwise unremarkable      Objective:  BP (!) 144/78   Pulse (!) 53   Temp (!) 97.4 F (36.3 C) (Temporal)   Ht 4\' 11"  (1.499 m)   Wt 154 lb 6.4 oz (70 kg)   SpO2 100%   BMI 31.19 kg/m    Wt Readings from Last 3 Encounters:   05/17/23 154 lb 6.4 oz (70 kg)  11/17/22 148 lb 12.8 oz (67.5 kg)  09/16/22 151 lb (68.5 kg)    Physical Exam Vitals and nursing note reviewed.  Constitutional:      General: She is not in acute distress.    Appearance: Normal appearance. She is well-developed and well-groomed. She is obese. She is not ill-appearing, toxic-appearing or diaphoretic.  HENT:     Head: Normocephalic and atraumatic.     Jaw: There is normal jaw occlusion.     Right Ear: Hearing normal.     Left Ear: Hearing normal.     Nose: Nose normal.     Mouth/Throat:  Lips: Pink.     Mouth: Mucous membranes are moist.     Pharynx: Oropharynx is clear. Uvula midline.  Eyes:     General: Lids are normal.     Extraocular Movements: Extraocular movements intact.     Conjunctiva/sclera: Conjunctivae normal.     Pupils: Pupils are equal, round, and reactive to light.  Neck:     Thyroid: No thyroid mass, thyromegaly or thyroid tenderness.     Vascular: No carotid bruit or JVD.     Trachea: Trachea and phonation normal.  Cardiovascular:     Rate and Rhythm: Normal rate and regular rhythm.     Chest Wall: PMI is not displaced.     Pulses: Normal pulses.     Heart sounds: Normal heart sounds. No murmur heard.    No friction rub. No gallop.  Pulmonary:     Effort: Pulmonary effort is normal. No respiratory distress.     Breath sounds: Normal breath sounds. No wheezing.  Abdominal:     General: Bowel sounds are normal. There is no distension or abdominal bruit.     Palpations: Abdomen is soft. There is no hepatomegaly or splenomegaly.     Tenderness: There is no abdominal tenderness. There is no right CVA tenderness or left CVA tenderness.     Hernia: No hernia is present.  Musculoskeletal:        General: Normal range of motion.     Cervical back: Normal range of motion and neck supple.     Right lower leg: No edema.     Left lower leg: No edema.  Lymphadenopathy:     Cervical: No cervical adenopathy.   Skin:    General: Skin is warm and dry.     Capillary Refill: Capillary refill takes less than 2 seconds.     Coloration: Skin is not cyanotic, jaundiced or pale.     Findings: No rash.  Neurological:     General: No focal deficit present.     Mental Status: She is alert and oriented to person, place, and time.     Sensory: Sensation is intact.     Motor: Motor function is intact.     Coordination: Coordination is intact.     Gait: Gait is intact.     Deep Tendon Reflexes: Reflexes are normal and symmetric.  Psychiatric:        Attention and Perception: Attention and perception normal.        Mood and Affect: Mood and affect normal.        Speech: Speech normal.        Behavior: Behavior normal. Behavior is cooperative.        Thought Content: Thought content normal.        Cognition and Memory: Cognition and memory normal.        Judgment: Judgment normal.     Results for orders placed or performed in visit on 11/17/22  CMP14+EGFR  Result Value Ref Range   Glucose 91 70 - 99 mg/dL   BUN 8 8 - 27 mg/dL   Creatinine, Ser 9.56 0.57 - 1.00 mg/dL   eGFR 66 >21 HY/QMV/7.84   BUN/Creatinine Ratio 9 (L) 12 - 28   Sodium 140 134 - 144 mmol/L   Potassium 4.3 3.5 - 5.2 mmol/L   Chloride 103 96 - 106 mmol/L   CO2 20 20 - 29 mmol/L   Calcium 9.4 8.7 - 10.3 mg/dL   Total Protein 6.9 6.0 - 8.5 g/dL  Albumin 4.4 3.8 - 4.8 g/dL   Globulin, Total 2.5 1.5 - 4.5 g/dL   Albumin/Globulin Ratio 1.8 1.2 - 2.2   Bilirubin Total 0.8 0.0 - 1.2 mg/dL   Alkaline Phosphatase 74 44 - 121 IU/L   AST 23 0 - 40 IU/L   ALT 18 0 - 32 IU/L  CBC with Differential/Platelet  Result Value Ref Range   WBC 3.4 3.4 - 10.8 x10E3/uL   RBC 4.61 3.77 - 5.28 x10E6/uL   Hemoglobin 12.8 11.1 - 15.9 g/dL   Hematocrit 62.1 30.8 - 46.6 %   MCV 85 79 - 97 fL   MCH 27.8 26.6 - 33.0 pg   MCHC 32.7 31.5 - 35.7 g/dL   RDW 65.7 84.6 - 96.2 %   Platelets 237 150 - 450 x10E3/uL   Neutrophils 46 Not Estab. %   Lymphs  43 Not Estab. %   Monocytes 10 Not Estab. %   Eos 1 Not Estab. %   Basos 0 Not Estab. %   Neutrophils Absolute 1.5 1.4 - 7.0 x10E3/uL   Lymphocytes Absolute 1.5 0.7 - 3.1 x10E3/uL   Monocytes Absolute 0.3 0.1 - 0.9 x10E3/uL   EOS (ABSOLUTE) 0.0 0.0 - 0.4 x10E3/uL   Basophils Absolute 0.0 0.0 - 0.2 x10E3/uL   Immature Granulocytes 0 Not Estab. %   Immature Grans (Abs) 0.0 0.0 - 0.1 x10E3/uL  Lipid panel  Result Value Ref Range   Cholesterol, Total 166 100 - 199 mg/dL   Triglycerides 55 0 - 149 mg/dL   HDL 62 >95 mg/dL   VLDL Cholesterol Cal 11 5 - 40 mg/dL   LDL Chol Calc (NIH) 93 0 - 99 mg/dL   Chol/HDL Ratio 2.7 0.0 - 4.4 ratio  Thyroid Panel With TSH  Result Value Ref Range   TSH 0.623 0.450 - 4.500 uIU/mL   T4, Total 9.2 4.5 - 12.0 ug/dL   T3 Uptake Ratio 27 24 - 39 %   Free Thyroxine Index 2.5 1.2 - 4.9  VITAMIN D 25 Hydroxy (Vit-D Deficiency, Fractures)  Result Value Ref Range   Vit D, 25-Hydroxy 22.4 (L) 30.0 - 100.0 ng/mL       Pertinent labs & imaging results that were available during my care of the patient were reviewed by me and considered in my medical decision making.  Assessment & Plan:  Mary Wilkinson was seen today for medical management of chronic issues.  Diagnoses and all orders for this visit:  Pure hypercholesterolemia -     CMP14+EGFR -     Lipid panel  Primary hypertension -     CMP14+EGFR -     CBC with Differential/Platelet -     Lipid panel -     Thyroid Panel With TSH -     VITAMIN D 25 Hydroxy (Vit-D Deficiency, Fractures)  Post menopausal syndrome -     CMP14+EGFR -     Thyroid Panel With TSH -     VITAMIN D 25 Hydroxy (Vit-D Deficiency, Fractures)  BMI 30.0-30.9,adult -     CMP14+EGFR -     CBC with Differential/Platelet -     Lipid panel -     Thyroid Panel With TSH -     VITAMIN D 25 Hydroxy (Vit-D Deficiency, Fractures)  Screening for colorectal cancer -     Ambulatory referral to Gastroenterology  Vitamin D deficiency -      CMP14+EGFR -     VITAMIN D 25 Hydroxy (Vit-D Deficiency, Fractures)  Assessment and Plan    Hypertension Well-controlled with metoprolol and lisinopril. No chest pain, leg swelling, or visual changes. Occasional orthostatic dizziness, resolves after a few minutes. Advised on slow positional changes. - Continue metoprolol and lisinopril - Monitor blood pressure regularly - Advise slow positional changes to prevent dizziness  Hyperlipidemia No issues with atorvastatin. No myalgia, fatigue, or weakness. - Continue atorvastatin  Vitamin D Deficiency Taking 2000 units daily. Will check levels for adequacy. - Continue vitamin D 2000 units daily - Order vitamin D level  General Health Maintenance Requires routine labs and colonoscopy. Prefers [facility] in [place] for convenience. - Order kidney, liver, and thyroid function tests - Refer to [facility] for colonoscopy  Follow-up - Schedule follow-up in six months unless new issues arise.          Continue all other maintenance medications.  Follow up plan: Return in about 6 months (around 11/15/2023), or if symptoms worsen or fail to improve, for chronic follow up.   Continue healthy lifestyle choices, including diet (rich in fruits, vegetables, and lean proteins, and low in salt and simple carbohydrates) and exercise (at least 30 minutes of moderate physical activity daily).  Educational handout given for health maintenance   The above assessment and management plan was discussed with the patient. The patient verbalized understanding of and has agreed to the management plan. Patient is aware to call the clinic if they develop any new symptoms or if symptoms persist or worsen. Patient is aware when to return to the clinic for a follow-up visit. Patient educated on when it is appropriate to go to the emergency department.   Kari Baars, FNP-C [facility] [phone]

## 2023-05-18 ENCOUNTER — Other Ambulatory Visit: Payer: Self-pay | Admitting: Family Medicine

## 2023-05-18 DIAGNOSIS — Z1231 Encounter for screening mammogram for malignant neoplasm of breast: Secondary | ICD-10-CM

## 2023-05-18 LAB — CBC WITH DIFFERENTIAL/PLATELET
Basophils Absolute: 0 10*3/uL (ref 0.0–0.2)
Basos: 0 %
EOS (ABSOLUTE): 0.1 10*3/uL (ref 0.0–0.4)
Eos: 2 %
Hematocrit: 41.2 % (ref 34.0–46.6)
Hemoglobin: 13.1 g/dL (ref 11.1–15.9)
Immature Grans (Abs): 0 10*3/uL (ref 0.0–0.1)
Immature Granulocytes: 0 %
Lymphocytes Absolute: 1.5 10*3/uL (ref 0.7–3.1)
Lymphs: 36 %
MCH: 27.5 pg (ref 26.6–33.0)
MCHC: 31.8 g/dL (ref 31.5–35.7)
MCV: 87 fL (ref 79–97)
Monocytes Absolute: 0.4 10*3/uL (ref 0.1–0.9)
Monocytes: 10 %
Neutrophils Absolute: 2.3 10*3/uL (ref 1.4–7.0)
Neutrophils: 52 %
Platelets: 274 10*3/uL (ref 150–450)
RBC: 4.76 x10E6/uL (ref 3.77–5.28)
RDW: 13 % (ref 11.7–15.4)
WBC: 4.3 10*3/uL (ref 3.4–10.8)

## 2023-05-18 LAB — CMP14+EGFR
ALT: 20 [IU]/L (ref 0–32)
AST: 24 [IU]/L (ref 0–40)
Albumin: 4.7 g/dL (ref 3.8–4.8)
Alkaline Phosphatase: 77 [IU]/L (ref 44–121)
BUN/Creatinine Ratio: 9 — ABNORMAL LOW (ref 12–28)
BUN: 8 mg/dL (ref 8–27)
Bilirubin Total: 1.1 mg/dL (ref 0.0–1.2)
CO2: 23 mmol/L (ref 20–29)
Calcium: 9.7 mg/dL (ref 8.7–10.3)
Chloride: 102 mmol/L (ref 96–106)
Creatinine, Ser: 0.93 mg/dL (ref 0.57–1.00)
Globulin, Total: 2.4 g/dL (ref 1.5–4.5)
Glucose: 90 mg/dL (ref 70–99)
Potassium: 4.5 mmol/L (ref 3.5–5.2)
Sodium: 138 mmol/L (ref 134–144)
Total Protein: 7.1 g/dL (ref 6.0–8.5)
eGFR: 64 mL/min/{1.73_m2} (ref >59)

## 2023-05-18 LAB — VITAMIN D 25 HYDROXY (VIT D DEFICIENCY, FRACTURES): Vit D, 25-Hydroxy: 19.6 ng/mL — ABNORMAL LOW (ref 30.0–100.0)

## 2023-05-18 LAB — LIPID PANEL
Chol/HDL Ratio: 3.2 {ratio} (ref 0.0–4.4)
Cholesterol, Total: 201 mg/dL — ABNORMAL HIGH (ref 100–199)
HDL: 63 mg/dL (ref >39)
LDL Chol Calc (NIH): 123 mg/dL — ABNORMAL HIGH (ref 0–99)
Triglycerides: 83 mg/dL (ref 0–149)
VLDL Cholesterol Cal: 15 mg/dL (ref 5–40)

## 2023-05-18 LAB — THYROID PANEL WITH TSH
Free Thyroxine Index: 2.7 (ref 1.2–4.9)
T3 Uptake Ratio: 28 % (ref 24–39)
T4, Total: 9.8 ug/dL (ref 4.5–12.0)
TSH: 1.14 u[IU]/mL (ref 0.450–4.500)

## 2023-06-30 ENCOUNTER — Other Ambulatory Visit: Payer: Self-pay | Admitting: Family Medicine

## 2023-06-30 DIAGNOSIS — E7800 Pure hypercholesterolemia, unspecified: Secondary | ICD-10-CM

## 2023-07-12 ENCOUNTER — Ambulatory Visit (INDEPENDENT_AMBULATORY_CARE_PROVIDER_SITE_OTHER): Payer: Medicare PPO

## 2023-07-12 VITALS — Ht 59.0 in | Wt 154.0 lb

## 2023-07-12 DIAGNOSIS — Z0000 Encounter for general adult medical examination without abnormal findings: Secondary | ICD-10-CM

## 2023-07-12 NOTE — Progress Notes (Signed)
Subjective:   Mary Wilkinson. Mary Wilkinson is a 78 y.o. female who presents for Medicare Annual (Subsequent) preventive examination.  Visit Complete: Virtual I connected with  Consuello M. Velasques on 07/12/23 by a audio enabled telemedicine application and verified that I am speaking with the correct person using two identifiers.  Patient Location: Home  Provider Location: Home Office  This patient declined Interactive audio and video telecommunications. Therefore the visit was completed with audio only.  I discussed the limitations of evaluation and management by telemedicine. The patient expressed understanding and agreed to proceed.  Vital Signs: Because this visit was a virtual/telehealth visit, some criteria may be missing or patient reported. Any vitals not documented were not able to be obtained and vitals that have been documented are patient reported.  Cardiac Risk Factors include: advanced age (>45men, >26 women);hypertension     Objective:    Today's Vitals   07/12/23 1700  Weight: 154 lb (69.9 kg)  Height: 4\' 11"  (1.499 m)   Body mass index is 31.1 kg/m.     07/12/2023    5:12 PM 07/09/2022   10:48 AM 05/13/2021   11:11 AM 11/27/2019   10:23 AM 06/11/2014    3:37 AM  Advanced Directives  Does Patient Have a Medical Advance Directive? No Yes Yes Yes No;Yes  Type of Special educational needs teacher of [place];Living will Healthcare Power of [place];Living will  Living will  Does patient want to make changes to medical advance directive?    No - Patient declined   Copy of Healthcare Power of Attorney in Chart?  No - copy requested No - copy requested    Would patient like information on creating a medical advance directive? Yes (MAU/Ambulatory/Procedural Areas - Information given)        Current Medications (verified) Outpatient Encounter Medications as of 07/12/2023  Medication Sig   atorvastatin (LIPITOR) 40 MG tablet TAKE 1 TABLET EVERY DAY   Cholecalciferol (VITAMIN D) 50  MCG (2000 UT) CAPS Take 2,000 Units by mouth daily.   estradiol (CLIMARA - DOSED IN MG/24 HR) 0.0375 mg/24hr patch Place 1 patch (0.0375 mg total) onto the skin once a week.   lisinopril (ZESTRIL) 40 MG tablet Take 1 tablet (40 mg total) by mouth daily. **NEEDS TO BE SEEN BEFORE NEXT REFILL**   metoprolol tartrate (LOPRESSOR) 25 MG tablet Take 1 tablet (25 mg total) by mouth 2 (two) times daily.   Omega-3 Fatty Acids (FISH OIL) 1200 MG CAPS Take 1,200 mg by mouth daily.   Saccharomyces boulardii (PROBIOTIC) 250 MG CAPS Take by mouth.   No facility-administered encounter medications on file as of 07/12/2023.    Allergies (verified) Patient has no known allergies.   History: Past Medical History:  Diagnosis Date   Allergy    Arthritis    left knee   History of abnormal cervical Pap smear    Had Hyst due to abnormal paps per patient   Hyperlipidemia    Hypertension    Past Surgical History:  Procedure Laterality Date   ABDOMINAL HYSTERECTOMY     KNEE SURGERY     left   Family History  Problem Relation Age of Onset   Heart disease Mother    Kidney disease Father    Breast cancer Maternal Aunt    Colon cancer Neg Hx    Esophageal cancer Neg Hx    Stomach cancer Neg Hx    Social History   Socioeconomic History   Marital status: Married  Spouse name: Remi Deter   Number of children: 4   Years of education: Not on file   Highest education level: Not on file  Occupational History   Occupation: Retired  Tobacco Use   Smoking status: Never   Smokeless tobacco: Never  Vaping Use   Vaping status: Never Used  Substance and Sexual Activity   Alcohol use: No    Alcohol/week: 0.0 standard drinks of alcohol   Drug use: No   Sexual activity: Not Currently    Birth control/protection: None    Comment: first intercourse <18,<5 sexual partners, no STD  Other Topics Concern   Not on file  Social History Narrative   Married x 57 years in 2022.   11 grandchildren   5 great  grandchildren.   Social Drivers of Corporate investment banker Strain: Low Risk  (07/12/2023)   Overall Financial Resource Strain (CARDIA)    Difficulty of Paying Living Expenses: Not hard at all  Food Insecurity: No Food Insecurity (07/12/2023)   Hunger Vital Sign    Worried About Running Out of Food in the Last Year: Never true    Ran Out of Food in the Last Year: Never true  Transportation Needs: No Transportation Needs (07/12/2023)   PRAPARE - Administrator, Civil Service (Medical): No    Lack of Transportation (Non-Medical): No  Physical Activity: Insufficiently Active (07/12/2023)   Exercise Vital Sign    Days of Exercise per Week: 3 days    Minutes of Exercise per Session: 30 min  Stress: No Stress Concern Present (07/12/2023)   [employer] of Occupational Health - Occupational Stress Questionnaire    Feeling of Stress : Not at all  Social Connections: Socially Integrated (07/12/2023)   Social Connection and Isolation Panel [NHANES]    Frequency of Communication with Friends and Family: More than three times a week    Frequency of Social Gatherings with Friends and Family: Three times a week    Attends Religious Services: More than 4 times per year    Active Member of Clubs or Organizations: Yes    Attends Engineer, structural: More than 4 times per year    Marital Status: Married    Tobacco Counseling Counseling given: Not Answered   Clinical Intake:  Pre-visit preparation completed: Yes  Pain : No/denies pain     Diabetes: No  How often do you need to have someone help you when you read instructions, pamphlets, or other written materials from your doctor or pharmacy?: 1 - Never  Interpreter Needed?: No  Information entered by :: Kandis Fantasia LPN   Activities of Daily Living    07/12/2023    5:12 PM  In your present state of health, do you have any difficulty performing the following activities:  Hearing? 0  Vision? 0   Difficulty concentrating or making decisions? 0  Walking or climbing stairs? 0  Dressing or bathing? 0  Doing errands, shopping? 0  Preparing Food and eating ? N  Using the Toilet? N  In the past six months, have you accidently leaked urine? N  Do you have problems with loss of bowel control? N  Managing your Medications? N  Managing your Finances? N  Housekeeping or managing your Housekeeping? N    Patient Care Team: Sonny Masters, FNP as PCP - General (Family Medicine) Patton Salles, MD as Consulting Physician (Obstetrics and Gynecology)  Indicate any recent Medical Services you may have received  from other than [facility] providers in the past year (date may be approximate).     Assessment:   This is a routine wellness examination for Mary Wilkinson.  Hearing/Vision screen Hearing Screening - Comments:: Denies hearing difficulties   Vision Screening - Comments:: Wears rx glasses - up to date with routine eye exams with Dr. Conley Rolls     Goals Addressed               This Visit's Progress     Remain active and independent (pt-stated)        Depression Screen    07/12/2023    5:08 PM 05/17/2023   11:05 AM 11/17/2022   10:57 AM 07/09/2022   10:47 AM 05/18/2022   10:46 AM 04/28/2022    2:55 PM 11/16/2021   11:00 AM  PHQ 2/9 Scores  PHQ - 2 Score 0 0 0 0 0 0 0  PHQ- 9 Score 0 0 0 0 0 0     Fall Risk    07/12/2023    5:11 PM 05/17/2023   11:05 AM 11/17/2022   10:57 AM 07/09/2022   10:47 AM 05/18/2022   10:46 AM  Fall Risk   Falls in the past year? 0 0 0 0 0  Number falls in past yr: 0   0 0  Injury with Fall? 0   0 0  Risk for fall due to : No Fall Risks   No Fall Risks No Fall Risks  Follow up Falls prevention discussed;Education provided;Falls evaluation completed   Falls prevention discussed Falls evaluation completed    MEDICARE RISK AT HOME: Medicare Risk at Home Any stairs in or around the home?: No If so, are there any without handrails?: No Home free of  loose throw rugs in walkways, pet beds, electrical cords, etc?: Yes Adequate lighting in your home to reduce risk of falls?: Yes Life alert?: No Use of a cane, walker or w/c?: No Grab bars in the bathroom?: Yes Shower chair or bench in shower?: No Elevated toilet seat or a handicapped toilet?: Yes  TIMED UP AND GO:  Was the test performed?  No    Cognitive Function:        07/12/2023    5:12 PM 07/09/2022   10:49 AM 05/13/2021   11:14 AM  6CIT Screen  What Year? 0 points 0 points 0 points  What month? 0 points 0 points 0 points  What time? 0 points 0 points 0 points  Count back from 20 0 points 0 points 0 points  Months in reverse 0 points 0 points 0 points  Repeat phrase 0 points 0 points 0 points  Total Score 0 points 0 points 0 points    Immunizations Immunization History  Administered Date(s) Administered   Hepatitis B 04/13/1996, 05/18/1996, 08/17/1996   Janssen (J&J) SARS-COV-2 Vaccination 06/18/2020   Pneumococcal Conjugate-13 10/12/2013   Pneumococcal Polysaccharide-23 10/23/2014   Td 09/14/2011   Tdap 09/14/2011    TDAP status: Due, Education has been provided regarding the importance of this vaccine. Advised may receive this vaccine at local pharmacy or Health Dept. Aware to provide a copy of the vaccination record if obtained from local pharmacy or Health Dept. Verbalized acceptance and understanding.  Flu Vaccine status: Declined, Education has been provided regarding the importance of this vaccine but patient still declined. Advised may receive this vaccine at local pharmacy or Health Dept. Aware to provide a copy of the vaccination record if obtained from local pharmacy or Health  Dept. Verbalized acceptance and understanding.  Pneumococcal vaccine status: Up to date  Covid-19 vaccine status: Declined, Education has been provided regarding the importance of this vaccine but patient still declined. Advised may receive this vaccine at local pharmacy or Health  Dept.or vaccine clinic. Aware to provide a copy of the vaccination record if obtained from local pharmacy or Health Dept. Verbalized acceptance and understanding.  Qualifies for Shingles Vaccine? Yes   Zostavax completed No   Shingrix Completed?: No.    Education has been provided regarding the importance of this vaccine. Patient has been advised to call insurance company to determine out of pocket expense if they have not yet received this vaccine. Advised may also receive vaccine at local pharmacy or Health Dept. Verbalized acceptance and understanding.  Screening Tests Health Maintenance  Topic Date Due   COVID-19 Vaccine (2 - Janssen risk series) 07/16/2020   Colonoscopy  02/02/2021   MAMMOGRAM  06/11/2023   DEXA SCAN  06/20/2023   Zoster Vaccines- Shingrix (1 of 2) 08/15/2023 (Originally 11/11/1964)   INFLUENZA VACCINE  09/12/2023 (Originally 01/13/2023)   Medicare Annual Wellness (AWV)  07/11/2024   Pneumonia Vaccine 67+ Years old  Completed   Hepatitis C Screening  Completed   HPV VACCINES  Aged Out   DTaP/Tdap/Td  Discontinued    Health Maintenance  Health Maintenance Due  Topic Date Due   COVID-19 Vaccine (2 - Janssen risk series) 07/16/2020   Colonoscopy  02/02/2021   MAMMOGRAM  06/11/2023   DEXA SCAN  06/20/2023    Colorectal cancer screening: No longer required.   Mammogram status: Ordered and scheduled . Pt provided with contact info and advised to call to schedule appt.   Bone Density status:  Patient declines at this time   Lung Cancer Screening: (Low Dose CT Chest recommended if Age 76-80 years, 20 pack-year currently smoking OR have quit w/in 15years.) does not qualify.   Lung Cancer Screening Referral: n/a  Additional Screening:  Hepatitis C Screening: does qualify; Completed 11/11/17  Vision Screening: Recommended annual ophthalmology exams for early detection of glaucoma and other disorders of the eye. Is the patient up to date with their annual eye  exam?  Yes  Who is the provider or what is the name of the office in which the patient attends annual eye exams? Dr.Le  If pt is not established with a provider, would they like to be referred to a provider to establish care? No .   Dental Screening: Recommended annual dental exams for proper oral hygiene  Community Resource Referral / Chronic Care Management: CRR required this visit?  No   CCM required this visit?  No     Plan:     I have personally reviewed and noted the following in the patient's chart:   Medical and social history Use of alcohol, tobacco or illicit drugs  Current medications and supplements including opioid prescriptions. Patient is not currently taking opioid prescriptions. Functional ability and status Nutritional status Physical activity Advanced directives List of other physicians Hospitalizations, surgeries, and ER visits in previous 12 months Vitals Screenings to include cognitive, depression, and falls Referrals and appointments  In addition, I have reviewed and discussed with patient certain preventive protocols, quality metrics, and best practice recommendations. A written personalized care plan for preventive services as well as general preventive health recommendations were provided to patient.     Kandis Fantasia [place], [place]   1/61/0960   After Visit Summary: (Mail) Due to this being a telephonic  visit, the after visit summary with patients personalized plan was offered to patient via mail   Nurse Notes: No concerns at this time

## 2023-07-12 NOTE — Patient Instructions (Signed)
Mary Wilkinson , Thank you for taking time to come for your Medicare Wellness Visit. I appreciate your ongoing commitment to your health goals. Please review the following plan we discussed and let me know if I can assist you in the future.   Referrals/Orders/Follow-Ups/Clinician Recommendations: Aim for 30 minutes of exercise or brisk walking, 6-8 glasses of water, and 5 servings of fruits and vegetables each day.  This is a list of the screening recommended for you and due dates:  Health Maintenance  Topic Date Due   COVID-19 Vaccine (2 - Janssen risk series) 07/16/2020   Colon Cancer Screening  02/02/2021   Mammogram  06/11/2023   DEXA scan (bone density measurement)  06/20/2023   Zoster (Shingles) Vaccine (1 of 2) 08/15/2023*   Flu Shot  09/12/2023*   Medicare Annual Wellness Visit  07/11/2024   Pneumonia Vaccine  Completed   Hepatitis C Screening  Completed   HPV Vaccine  Aged Out   DTaP/Tdap/Td vaccine  Discontinued  *Topic was postponed. The date shown is not the original due date.    Advanced directives: (ACP Link)Information on Advanced Care Planning can be found at [facility] Advance Health Care Directives Advance Health Care Directives ([email or web address]   Next Medicare Annual Wellness Visit scheduled for next year: Yes

## 2023-07-21 ENCOUNTER — Encounter: Payer: Self-pay | Admitting: Internal Medicine

## 2023-07-25 ENCOUNTER — Ambulatory Visit
Admission: RE | Admit: 2023-07-25 | Discharge: 2023-07-25 | Disposition: A | Discharge status: Home / Self Care | Payer: Medicare PPO | Source: Ambulatory Visit | Attending: Family Medicine

## 2023-07-25 DIAGNOSIS — Z1231 Encounter for screening mammogram for malignant neoplasm of breast: Secondary | ICD-10-CM

## 2023-08-09 ENCOUNTER — Ambulatory Visit (AMBULATORY_SURGERY_CENTER): Payer: Medicare PPO | Admitting: *Deleted

## 2023-08-09 VITALS — Ht 59.0 in | Wt 145.0 lb

## 2023-08-09 DIAGNOSIS — Z1211 Encounter for screening for malignant neoplasm of colon: Secondary | ICD-10-CM

## 2023-08-09 MED ORDER — SUFLAVE 178.7 G PO SOLR: 1.0000 | Freq: Once | ORAL | 0 refills | Status: AC

## 2023-08-09 NOTE — Progress Notes (Unsigned)
 Pt's name and DOB verified at the beginning of the pre-visit wit 2 identifiers   Pt denies any difficulty with ambulating,sitting, laying down or rolling side to side  Pt has no issues with ambulation   Pt has no issues moving head neck or swallowing  No egg or soy allergy known to patient   No issues known to pt with past sedation with any surgeries or procedures  Pt denies having issues being intubated  No FH of Malignant Hyperthermia  Pt is not on diet pills or shots  Pt is not on home 02   Pt is not on blood thinners   Pt denies issues with constipation   Pt is not on dialysis  Pt denise any abnormal heart rhythms   Pt denies any upcoming cardiac testing  Patient's chart reviewed by Cathlyn Parsons CNRA prior to pre-visit and patient appropriate for the [facility].  Pre-visit completed and red dot placed by patient's name on their procedure day (on provider's schedule).     Visit by phone  Pt states weight is 145 lb    IInstructions reviewed. Pt given [facility], [facility] main # and MD on call # prior to instructions.  Pt states understanding of instructions. Instructed to review again prior to procedure. Pt states they will.   Informed pt that they will receive a text or  call from [facility] regarding there prep med.

## 2023-08-11 MED ORDER — SUFLAVE 178.7 G PO SOLR: 1.0000 | Freq: Once | ORAL | 0 refills | Status: AC

## 2023-08-11 NOTE — Addendum Note (Signed)
 Addended by: Danton Sewer on: 08/11/2023 10:00 AM   Modules accepted: Orders

## 2023-09-01 DIAGNOSIS — H5213 Myopia, bilateral: Secondary | ICD-10-CM | POA: Diagnosis not present

## 2023-09-01 DIAGNOSIS — Z961 Presence of intraocular lens: Secondary | ICD-10-CM | POA: Diagnosis not present

## 2023-09-01 DIAGNOSIS — H524 Presbyopia: Secondary | ICD-10-CM | POA: Diagnosis not present

## 2023-09-01 DIAGNOSIS — H52223 Regular astigmatism, bilateral: Secondary | ICD-10-CM | POA: Diagnosis not present

## 2023-09-02 ENCOUNTER — Telehealth: Payer: Self-pay | Admitting: Family Medicine

## 2023-09-02 NOTE — Telephone Encounter (Signed)
 Copied from CRM [id number]. Topic: Clinical - Medication Question >> Sep 02, 2023 11:44 AM Marland Kitchen D wrote: Patient states she needs prescription- Norethin called in before her appointment on September 12, 2023.  Preferred Pharmacy: [facility] [address] [place] - [address]

## 2023-09-02 NOTE — Telephone Encounter (Signed)
 GI told patient she needed Norethin called in to the pharmacy before she can have colonoscopy? Said that PCP has to call it in

## 2023-09-06 NOTE — Telephone Encounter (Signed)
 Pt did talk to her GI doctor and she does not need anything else.

## 2023-09-07 ENCOUNTER — Encounter: Payer: Self-pay | Admitting: Internal Medicine

## 2023-09-12 ENCOUNTER — Encounter: Payer: Self-pay | Admitting: Internal Medicine

## 2023-09-12 ENCOUNTER — Ambulatory Visit (AMBULATORY_SURGERY_CENTER): Payer: Medicare PPO | Admitting: Internal Medicine

## 2023-09-12 VITALS — BP 128/63 | HR 55 | Temp 97.9°F | Resp 15 | Ht 59.0 in | Wt 145.0 lb

## 2023-09-12 DIAGNOSIS — Z1211 Encounter for screening for malignant neoplasm of colon: Secondary | ICD-10-CM

## 2023-09-12 DIAGNOSIS — E785 Hyperlipidemia, unspecified: Secondary | ICD-10-CM | POA: Diagnosis not present

## 2023-09-12 DIAGNOSIS — K648 Other hemorrhoids: Secondary | ICD-10-CM | POA: Diagnosis not present

## 2023-09-12 DIAGNOSIS — D125 Benign neoplasm of sigmoid colon: Secondary | ICD-10-CM | POA: Diagnosis not present

## 2023-09-12 DIAGNOSIS — I10 Essential (primary) hypertension: Secondary | ICD-10-CM | POA: Diagnosis not present

## 2023-09-12 MED ORDER — SODIUM CHLORIDE 0.9 % IV SOLN: 500.0000 mL | INTRAVENOUS | Status: DC

## 2023-09-12 NOTE — Progress Notes (Signed)
 1016 - Pt with good respiratory effort and palpable exhalation noted at the mouth. Poor ETCO2 waveform. Will monitor.

## 2023-09-12 NOTE — Op Note (Signed)
 [facility] Patient Name: Mary Wilkinson Procedure Date: 09/12/2023 9:56 AM MRN: [id number] Endoscopist: Beverley Fiedler , MD, [id number] Age: 78 Referring MD:  Date of Birth: 03-17-1946 Gender: Female Account #: [id number] Procedure:                Colonoscopy Indications:              Screening for colorectal malignant neoplasm, Last                            colonoscopy: 2012 (normal) Medicines:                Monitored Anesthesia Care Procedure:                Pre-Anesthesia Assessment:                           - Prior to the procedure, a History and Physical                            was performed, and patient medications and                            allergies were reviewed. The patient's tolerance of                            previous anesthesia was also reviewed. The risks                            and benefits of the procedure and the sedation                            options and risks were discussed with the patient.                            All questions were answered, and informed consent                            was obtained. Prior Anticoagulants: The patient has                            taken no anticoagulant or antiplatelet agents. ASA                            Grade Assessment: II - A patient with mild systemic                            disease. After reviewing the risks and benefits,                            the patient was deemed in satisfactory condition to                            undergo the procedure.  After obtaining informed consent, the colonoscope                            was passed under direct vision. Throughout the                            procedure, the patient's blood pressure, pulse, and                            oxygen saturations were monitored continuously. The                            Olympus Scope SN: [id number] was introduced through                            the anus and advanced to the  terminal ileum. The                            colonoscopy was performed without difficulty. The                            patient tolerated the procedure well. The quality                            of the bowel preparation was excellent. The                            terminal ileum, ileocecal valve, appendiceal                            orifice, and rectum were photographed. Scope In: 10:13:40 AM Scope Out: 10:22:07 AM Scope Withdrawal Time: 0 hours 7 minutes 6 seconds  Total Procedure Duration: 0 hours 8 minutes 27 seconds  Findings:                 The digital rectal exam was normal.                           The terminal ileum appeared normal.                           A 4 mm polyp was found in the sigmoid colon. The                            polyp was sessile. The polyp was removed with a                            cold snare. Resection and retrieval were complete.                           Internal hemorrhoids were found during                            retroflexion. The hemorrhoids were small.  The exam was otherwise without abnormality. Complications:            No immediate complications. Estimated Blood Loss:     Estimated blood loss: none. Impression:               - The examined portion of the ileum was normal.                           - One 4 mm polyp in the sigmoid colon, removed with                            a cold snare. Resected and retrieved.                           - Small internal hemorrhoids.                           - The examination was otherwise normal. Recommendation:           - Patient has a contact number available for                            emergencies. The signs and symptoms of potential                            delayed complications were discussed with the                            patient. Return to normal activities tomorrow.                            Written discharge instructions were provided to the                             patient.                           - Resume previous diet.                           - Continue present medications.                           - Await pathology results.                           - No repeat colonoscopy due to age at next                            screening/surveillance interval (> 80 years) and                            lack of previously risk-high polyps. Beverley Fiedler, MD 09/12/2023 10:24:42 AM This report has been signed electronically.

## 2023-09-12 NOTE — Progress Notes (Signed)
 GASTROENTEROLOGY PROCEDURE H&P NOTE   Primary Care Physician: Sonny Masters, FNP    Reason for Procedure:  Colon cancer screening  Plan:    Colonoscopy  Patient is appropriate for endoscopic procedure(s) in the ambulatory ([facility]) setting.  The nature of the procedure, as well as the risks, benefits, and alternatives were carefully and thoroughly reviewed with the patient. Ample time for discussion and questions allowed. The patient understood, was satisfied, and agreed to proceed.     HPI: Mary Wilkinson is a 78 y.o. female who presents for colonoscopy.  Medical history as below.  Tolerated the prep.  No recent chest pain or shortness of breath.  No abdominal pain today.  Past Medical History:  Diagnosis Date   Allergy    Arthritis    left knee   Cataract    History of abnormal cervical Pap smear    Had Hyst due to abnormal paps per patient   Hyperlipidemia    Hypertension    Insomnia     Past Surgical History:  Procedure Laterality Date   ABDOMINAL HYSTERECTOMY     COLONOSCOPY     KNEE SURGERY     left    Prior to Admission medications   Medication Sig Start Date End Date Taking? Authorizing Provider  atorvastatin (LIPITOR) 40 MG tablet TAKE 1 TABLET EVERY DAY 06/30/23  Yes Rakes, Doralee Albino, FNP  estradiol (CLIMARA - DOSED IN MG/24 HR) 0.0375 mg/24hr patch Place 1 patch (0.0375 mg total) onto the skin once a week. 11/11/22  Yes Patton Salles, MD  lisinopril (ZESTRIL) 40 MG tablet Take 1 tablet (40 mg total) by mouth daily. **NEEDS TO BE SEEN BEFORE NEXT REFILL** 04/25/23  Yes Rakes, Doralee Albino, FNP  metoprolol tartrate (LOPRESSOR) 25 MG tablet Take 1 tablet (25 mg total) by mouth 2 (two) times daily. 12/13/22  Yes Rakes, Doralee Albino, FNP  Omega-3 Fatty Acids (FISH OIL) 1200 MG CAPS Take 1,200 mg by mouth daily.   Yes [provider]  Saccharomyces boulardii (PROBIOTIC) 250 MG CAPS Take by mouth.   Yes [provider]  Cholecalciferol (VITAMIN D)  50 MCG (2000 UT) CAPS Take 2,000 Units by mouth daily.    [provider]    Current Outpatient Medications  Medication Sig Dispense Refill   atorvastatin (LIPITOR) 40 MG tablet TAKE 1 TABLET EVERY DAY 90 tablet 1   estradiol (CLIMARA - DOSED IN MG/24 HR) 0.0375 mg/24hr patch Place 1 patch (0.0375 mg total) onto the skin once a week. 12 patch 2   lisinopril (ZESTRIL) 40 MG tablet Take 1 tablet (40 mg total) by mouth daily. **NEEDS TO BE SEEN BEFORE NEXT REFILL** 90 tablet 0   metoprolol tartrate (LOPRESSOR) 25 MG tablet Take 1 tablet (25 mg total) by mouth 2 (two) times daily. 180 tablet 1   Omega-3 Fatty Acids (FISH OIL) 1200 MG CAPS Take 1,200 mg by mouth daily.     Saccharomyces boulardii (PROBIOTIC) 250 MG CAPS Take by mouth.     Cholecalciferol (VITAMIN D) 50 MCG (2000 UT) CAPS Take 2,000 Units by mouth daily.     Current Facility-Administered Medications  Medication Dose Route Frequency Provider Last Rate Last Admin   0.9 %  sodium chloride infusion  500 mL Intravenous Continuous Kayslee Furey, Carie Caddy, MD        Allergies as of 09/12/2023   (No Known Allergies)    Family History  Problem Relation Age of Onset   Heart disease  Mother    Kidney disease Father    Breast cancer Maternal Aunt    Colon cancer Neg Hx    Esophageal cancer Neg Hx    Stomach cancer Neg Hx    Colon polyps Neg Hx    Rectal cancer Neg Hx     Social History   Socioeconomic History   Marital status: Married    Spouse name: Remi Deter   Number of children: 4   Years of education: Not on file   Highest education level: Not on file  Occupational History   Occupation: Retired  Tobacco Use   Smoking status: Never   Smokeless tobacco: Never  Vaping Use   Vaping status: Never Used  Substance and Sexual Activity   Alcohol use: No    Alcohol/week: 0.0 standard drinks of alcohol   Drug use: No   Sexual activity: Not Currently    Birth control/protection: None, Surgical    Comment: first intercourse  <16,<5 sexual partners, no STD  Other Topics Concern   Not on file  Social History Narrative   Married x 57 years in 2022.   11 grandchildren   5 great grandchildren.   Social Drivers of Corporate investment banker Strain: Low Risk  (07/12/2023)   Overall Financial Resource Strain (CARDIA)    Difficulty of Paying Living Expenses: Not hard at all  Food Insecurity: No Food Insecurity (07/12/2023)   Hunger Vital Sign    Worried About Running Out of Food in the Last Year: Never true    Ran Out of Food in the Last Year: Never true  Transportation Needs: No Transportation Needs (07/12/2023)   PRAPARE - Administrator, Civil Service (Medical): No    Lack of Transportation (Non-Medical): No  Physical Activity: Insufficiently Active (07/12/2023)   Exercise Vital Sign    Days of Exercise per Week: 3 days    Minutes of Exercise per Session: 30 min  Stress: No Stress Concern Present (07/12/2023)   [employer] of Occupational Health - Occupational Stress Questionnaire    Feeling of Stress : Not at all  Social Connections: Socially Integrated (07/12/2023)   Social Connection and Isolation Panel [NHANES]    Frequency of Communication with Friends and Family: More than three times a week    Frequency of Social Gatherings with Friends and Family: Three times a week    Attends Religious Services: More than 4 times per year    Active Member of Clubs or Organizations: Yes    Attends Banker Meetings: More than 4 times per year    Marital Status: Married  Catering manager Violence: Not At Risk (07/12/2023)   Humiliation, Afraid, Rape, and Kick questionnaire    Fear of Current or Ex-Partner: No    Emotionally Abused: No    Physically Abused: No    Sexually Abused: No    Physical Exam: Vital signs in last 24 hours: @BP  (!) 155/88   Pulse 61   Temp 97.9 F (36.6 C) (Skin)   Ht 4\' 11"  (1.499 m)   Wt 145 lb (65.8 kg)   SpO2 96%   BMI 29.29 kg/m  GEN: NAD EYE:  Sclerae anicteric ENT: MMM CV: Non-tachycardic Pulm: CTA b/l GI: Soft, NT/ND NEURO:  Alert & Oriented x 3   Erick Blinks, MD [facility]  09/12/2023 10:02 AM

## 2023-09-12 NOTE — Progress Notes (Signed)
 Called to room to assist during endoscopic procedure.  Patient ID and intended procedure confirmed with present staff. Received instructions for my participation in the procedure from the performing physician.

## 2023-09-12 NOTE — Patient Instructions (Signed)
 Educational handout provided to patient related to Hemorrhoids&  Polyps  Resume previous diet  Continue present medications  Awaiting pathology results  NO REPEAT COLONOSCOPY DUE TO AGE at next screening.    YOU HAD AN ENDOSCOPIC PROCEDURE TODAY AT THE [facility]:   Refer to the procedure report that was given to you for any specific questions about what was found during the examination.  If the procedure report does not answer your questions, please call your gastroenterologist to clarify.  If you requested that your care partner not be given the details of your procedure findings, then the procedure report has been included in a sealed envelope for you to review at your convenience later.  YOU SHOULD EXPECT: Some feelings of bloating in the abdomen. Passage of more gas than usual.  Walking can help get rid of the air that was put into your GI tract during the procedure and reduce the bloating. If you had a lower endoscopy (such as a colonoscopy or flexible sigmoidoscopy) you may notice spotting of blood in your stool or on the toilet paper. If you underwent a bowel prep for your procedure, you may not have a normal bowel movement for a few days.  Please Note:  You might notice some irritation and congestion in your nose or some drainage.  This is from the oxygen used during your procedure.  There is no need for concern and it should clear up in a day or so.  SYMPTOMS TO REPORT IMMEDIATELY:  Following lower endoscopy (colonoscopy or flexible sigmoidoscopy):  Excessive amounts of blood in the stool  Significant tenderness or worsening of abdominal pains  Swelling of the abdomen that is new, acute  Fever of 100F or higher  For urgent or emergent issues, a gastroenterologist can be reached at any hour by calling [phone]. Do not use MyChart messaging for urgent concerns.    DIET:  We do recommend a small meal at first, but then you may proceed to your regular diet.   Drink plenty of fluids but you should avoid alcoholic beverages for 24 hours.  ACTIVITY:  You should plan to take it easy for the rest of today and you should NOT DRIVE or use heavy machinery until tomorrow (because of the sedation medicines used during the test).    FOLLOW UP: Our staff will call the number listed on your records the next business day following your procedure.  We will call around 7:15- 8:00 am to check on you and address any questions or concerns that you may have regarding the information given to you following your procedure. If we do not reach you, we will leave a message.     If any biopsies were taken you will be contacted by phone or by letter within the next 1-3 weeks.  Please call us at [phone] if you have not heard about the biopsies in 3 weeks.    SIGNATURES/CONFIDENTIALITY: You and/or your care partner have signed paperwork which will be entered into your electronic medical record.  These signatures attest to the fact that that the information above on your After Visit Summary has been reviewed and is understood.  Full responsibility of the confidentiality of this discharge information lies with you and/or your care-partner.

## 2023-09-12 NOTE — Progress Notes (Signed)
 Pt resting comfortably. VSS. Airway intact. SBAR complete to RN. All questions answered.

## 2023-09-12 NOTE — Progress Notes (Signed)
 VS by [facility]  Pt's states no medical or surgical changes since previsit or office visit.

## 2023-09-13 ENCOUNTER — Telehealth: Payer: Self-pay | Admitting: *Deleted

## 2023-09-13 ENCOUNTER — Encounter: Payer: Self-pay | Admitting: Family Medicine

## 2023-09-13 ENCOUNTER — Ambulatory Visit: Admitting: Family Medicine

## 2023-09-13 VITALS — BP 132/73 | HR 60 | Temp 97.4°F | Ht 59.0 in | Wt 151.6 lb

## 2023-09-13 DIAGNOSIS — Z202 Contact with and (suspected) exposure to infections with a predominantly sexual mode of transmission: Secondary | ICD-10-CM

## 2023-09-13 NOTE — Telephone Encounter (Signed)
  Follow up Call-     09/12/2023    9:16 AM  Call back number  Post procedure Call Back phone  # [phone]  Permission to leave phone message Yes     Patient questions:  Do you have a fever, pain , or abdominal swelling? No. Pain Score  0 *  Have you tolerated food without any problems? Yes.    Have you been able to return to your normal activities? Yes.    Do you have any questions about your discharge instructions: Diet   No. Medications  No. Follow up visit  No.  Do you have questions or concerns about your Care? No.  Actions: * If pain score is 4 or above: No action needed, pain <4.

## 2023-09-13 NOTE — Progress Notes (Signed)
 Subjective:  Patient ID: Mary Wilkinson, female    DOB: 1946/05/26, 78 y.o.   MRN: [id number]  Patient Care Team: Sonny Masters, FNP as PCP - General (Family Medicine) Patton Salles, MD as Consulting Physician (Obstetrics and Gynecology)   Chief Complaint:  Exposure to STD (Husband is positive for syphilis )   HPI: Mary Wilkinson is a 78 y.o. female presenting on 09/13/2023 for Exposure to STD (Husband is positive for syphilis )   Discussed the use of AI scribe software for clinical note transcription with the patient, who gave verbal consent to proceed.  History of Present Illness   Mary Wilkinson is a 78 year old female who presents for syphilis testing after her husband was diagnosed with neurosyphilis.  She has not experienced any symptoms such as sores in the vaginal area or rashes on the lower extremities, which are associated with different stages of syphilis. No other symptoms are reported.  Her husband has been diagnosed with tertiary syphilis, also known as neurosyphilis, which has affected his brain. He was hospitalized after being found on the floor, unable to get up, and has been receiving treatment for his condition. He has a history of cancer and is undergoing treatment with a medication called Keytruda, which may contribute to his symptoms of weakness and fatigue.          Relevant past medical, surgical, family, and social history reviewed and updated as indicated.  Allergies and medications reviewed and updated. Data reviewed: Chart in Epic.   Past Medical History:  Diagnosis Date   Allergy    Arthritis    left knee   Cataract    History of abnormal cervical Pap smear    Had Hyst due to abnormal paps per patient   Hyperlipidemia    Hypertension    Insomnia     Past Surgical History:  Procedure Laterality Date   ABDOMINAL HYSTERECTOMY     COLONOSCOPY     KNEE SURGERY     left    Social History   Socioeconomic History   Marital  status: Married    Spouse name: Mary Wilkinson   Number of children: 4   Years of education: Not on file   Highest education level: Not on file  Occupational History   Occupation: Retired  Tobacco Use   Smoking status: Never   Smokeless tobacco: Never  Vaping Use   Vaping status: Never Used  Substance and Sexual Activity   Alcohol use: No    Alcohol/week: 0.0 standard drinks of alcohol   Drug use: No   Sexual activity: Not Currently    Birth control/protection: None, Surgical    Comment: first intercourse <16,<5 sexual partners, no STD  Other Topics Concern   Not on file  Social History Narrative   Married x 57 years in 2022.   11 grandchildren   5 great grandchildren.   Social Drivers of Corporate investment banker Strain: Low Risk  (07/12/2023)   Overall Financial Resource Strain (CARDIA)    Difficulty of Paying Living Expenses: Not hard at all  Food Insecurity: No Food Insecurity (07/12/2023)   Hunger Vital Sign    Worried About Running Out of Food in the Last Year: Never true    Ran Out of Food in the Last Year: Never true  Transportation Needs: No Transportation Needs (07/12/2023)   PRAPARE - Transportation    Lack of Transportation (Medical): No    Lack  of Transportation (Non-Medical): No  Physical Activity: Insufficiently Active (07/12/2023)   Exercise Vital Sign    Days of Exercise per Week: 3 days    Minutes of Exercise per Session: 30 min  Stress: No Stress Concern Present (07/12/2023)   [employer] of Occupational Health - Occupational Stress Questionnaire    Feeling of Stress : Not at all  Social Connections: Socially Integrated (07/12/2023)   Social Connection and Isolation Panel [NHANES]    Frequency of Communication with Friends and Family: More than three times a week    Frequency of Social Gatherings with Friends and Family: Three times a week    Attends Religious Services: More than 4 times per year    Active Member of Clubs or Organizations: Yes     Attends Banker Meetings: More than 4 times per year    Marital Status: Married  Catering manager Violence: Not At Risk (07/12/2023)   Humiliation, Afraid, Rape, and Kick questionnaire    Fear of Current or Ex-Partner: No    Emotionally Abused: No    Physically Abused: No    Sexually Abused: No    Outpatient Encounter Medications as of 09/13/2023  Medication Sig   atorvastatin (LIPITOR) 40 MG tablet TAKE 1 TABLET EVERY DAY   Cholecalciferol (VITAMIN D) 50 MCG (2000 UT) CAPS Take 2,000 Units by mouth daily.   estradiol (CLIMARA - DOSED IN MG/24 HR) 0.0375 mg/24hr patch Place 1 patch (0.0375 mg total) onto the skin once a week.   lisinopril (ZESTRIL) 40 MG tablet Take 1 tablet (40 mg total) by mouth daily. **NEEDS TO BE SEEN BEFORE NEXT REFILL**   metoprolol tartrate (LOPRESSOR) 25 MG tablet Take 1 tablet (25 mg total) by mouth 2 (two) times daily.   Omega-3 Fatty Acids (FISH OIL) 1200 MG CAPS Take 1,200 mg by mouth daily.   Saccharomyces boulardii (PROBIOTIC) 250 MG CAPS Take by mouth.   No facility-administered encounter medications on file as of 09/13/2023.    No Known Allergies  Pertinent ROS per HPI, otherwise unremarkable      Objective:  BP 132/73   Pulse 60   Temp (!) 97.4 F (36.3 C)   Ht 4\' 11"  (1.499 m)   Wt 151 lb 9.6 oz (68.8 kg)   SpO2 100%   BMI 30.62 kg/m    Wt Readings from Last 3 Encounters:  09/13/23 151 lb 9.6 oz (68.8 kg)  09/12/23 145 lb (65.8 kg)  08/09/23 145 lb (65.8 kg)    Physical Exam Vitals and nursing note reviewed.  Constitutional:      Appearance: Normal appearance.  HENT:     Head: Normocephalic and atraumatic.     Nose: Nose normal.  Eyes:     Pupils: Pupils are equal, round, and reactive to light.  Cardiovascular:     Rate and Rhythm: Normal rate.  Pulmonary:     Effort: Pulmonary effort is normal.  Musculoskeletal:     Cervical back: Neck supple.  Neurological:     General: No focal deficit present.     Mental  Status: She is alert and oriented to person, place, and time.  Psychiatric:        Mood and Affect: Mood normal.        Behavior: Behavior normal.        Thought Content: Thought content normal.        Judgment: Judgment normal.       Results for orders placed or performed in visit on  05/17/23  CMP14+EGFR   Collection Time: 05/17/23 11:26 AM  Result Value Ref Range   Glucose 90 70 - 99 mg/dL   BUN 8 8 - 27 mg/dL   Creatinine, Ser 2.13 0.57 - 1.00 mg/dL   eGFR 64 >08 MV/HQI/6.96   BUN/Creatinine Ratio 9 (L) 12 - 28   Sodium 138 134 - 144 mmol/L   Potassium 4.5 3.5 - 5.2 mmol/L   Chloride 102 96 - 106 mmol/L   CO2 23 20 - 29 mmol/L   Calcium 9.7 8.7 - 10.3 mg/dL   Total Protein 7.1 6.0 - 8.5 g/dL   Albumin 4.7 3.8 - 4.8 g/dL   Globulin, Total 2.4 1.5 - 4.5 g/dL   Bilirubin Total 1.1 0.0 - 1.2 mg/dL   Alkaline Phosphatase 77 44 - 121 IU/L   AST 24 0 - 40 IU/L   ALT 20 0 - 32 IU/L  CBC with Differential/Platelet   Collection Time: 05/17/23 11:26 AM  Result Value Ref Range   WBC 4.3 3.4 - 10.8 x10E3/uL   RBC 4.76 3.77 - 5.28 x10E6/uL   Hemoglobin 13.1 11.1 - 15.9 g/dL   Hematocrit 29.5 28.4 - 46.6 %   MCV 87 79 - 97 fL   MCH 27.5 26.6 - 33.0 pg   MCHC 31.8 31.5 - 35.7 g/dL   RDW 13.2 44.0 - 10.2 %   Platelets 274 150 - 450 x10E3/uL   Neutrophils 52 Not Estab. %   Lymphs 36 Not Estab. %   Monocytes 10 Not Estab. %   Eos 2 Not Estab. %   Basos 0 Not Estab. %   Neutrophils Absolute 2.3 1.4 - 7.0 x10E3/uL   Lymphocytes Absolute 1.5 0.7 - 3.1 x10E3/uL   Monocytes Absolute 0.4 0.1 - 0.9 x10E3/uL   EOS (ABSOLUTE) 0.1 0.0 - 0.4 x10E3/uL   Basophils Absolute 0.0 0.0 - 0.2 x10E3/uL   Immature Granulocytes 0 Not Estab. %   Immature Grans (Abs) 0.0 0.0 - 0.1 x10E3/uL  Lipid panel   Collection Time: 05/17/23 11:26 AM  Result Value Ref Range   Cholesterol, Total 201 (H) 100 - 199 mg/dL   Triglycerides 83 0 - 149 mg/dL   HDL 63 >72 mg/dL   VLDL Cholesterol Cal 15 5 - 40  mg/dL   LDL Chol Calc (NIH) 536 (H) 0 - 99 mg/dL   Chol/HDL Ratio 3.2 0.0 - 4.4 ratio  Thyroid Panel With TSH   Collection Time: 05/17/23 11:26 AM  Result Value Ref Range   TSH 1.140 0.450 - 4.500 uIU/mL   T4, Total 9.8 4.5 - 12.0 ug/dL   T3 Uptake Ratio 28 24 - 39 %   Free Thyroxine Index 2.7 1.2 - 4.9  VITAMIN D 25 Hydroxy (Vit-D Deficiency, Fractures)   Collection Time: 05/17/23 11:26 AM  Result Value Ref Range   Vit D, 25-Hydroxy 19.6 (L) 30.0 - 100.0 ng/mL       Pertinent labs & imaging results that were available during my care of the patient were reviewed by me and considered in my medical decision making.  Assessment & Plan:  Mary Wilkinson was seen today for exposure to std.  Diagnoses and all orders for this visit:  Exposure to syphilis -     RPR     Assessment and Plan    Syphilis screening Screening is indicated due to her husband's recent diagnosis of tertiary syphilis (neurosyphilis). She reports no symptoms such as sores or rashes. The RPR test will determine if she has  contracted syphilis. If positive, penicillin treatment will be initiated. - Order RPR test - If RPR is positive, initiate penicillin treatment          Continue all other maintenance medications.  Follow up plan: Return if symptoms worsen or fail to improve.   Continue healthy lifestyle choices, including diet (rich in fruits, vegetables, and lean proteins, and low in salt and simple carbohydrates) and exercise (at least 30 minutes of moderate physical activity daily).   The above assessment and management plan was discussed with the patient. The patient verbalized understanding of and has agreed to the management plan. Patient is aware to call the clinic if they develop any new symptoms or if symptoms persist or worsen. Patient is aware when to return to the clinic for a follow-up visit. Patient educated on when it is appropriate to go to the emergency department.   Kari Baars,  FNP-C [facility] [phone]

## 2023-09-14 LAB — SURGICAL PATHOLOGY

## 2023-09-14 LAB — RPR: RPR Ser Ql: NONREACTIVE

## 2023-09-15 ENCOUNTER — Encounter: Payer: Self-pay | Admitting: Internal Medicine

## 2023-10-12 ENCOUNTER — Other Ambulatory Visit: Payer: Self-pay | Admitting: Obstetrics and Gynecology

## 2023-10-12 DIAGNOSIS — N951 Menopausal and female climacteric states: Secondary | ICD-10-CM

## 2023-10-12 NOTE — Telephone Encounter (Signed)
 Medication refill request: estradiol  patch  Last AEX:  09/16/22 Next AEX: 11/01/23 Last MMG (if hormonal medication request): 07/24/53 bi-rads 1 neg  Refill authorized: please advise.

## 2023-10-18 ENCOUNTER — Other Ambulatory Visit: Payer: Self-pay

## 2023-10-18 DIAGNOSIS — N951 Menopausal and female climacteric states: Secondary | ICD-10-CM

## 2023-10-18 NOTE — Telephone Encounter (Signed)
 Medication refill request: estradiol  patch  Last AEX:  09/16/22 Next AEX: 11/01/23 Last MMG (if hormonal medication request): 07/24/53 bi-rads 1 neg  Refill authorized: please advise.

## 2023-10-20 ENCOUNTER — Other Ambulatory Visit: Payer: Self-pay | Admitting: Obstetrics and Gynecology

## 2023-10-20 DIAGNOSIS — N951 Menopausal and female climacteric states: Secondary | ICD-10-CM

## 2023-10-28 NOTE — Progress Notes (Signed)
 GYNECOLOGY  VISIT   HPI: 78 y.o.   Married  [place] female   [phone] with No LMP recorded. Patient has had a hysterectomy.   here for: 1 year f/u on meds -  Estradiol  - Patent states it has helped with hot flashes but not much.   Patient is treated with ERT for menopausal symptoms.   The [place]  is not sticking well.   She stopped the gabapentin .  It caused her to feel woozy and nervous.   Having foot pain.  She will see her PCP.   Not sexually active.   Her husband has cancer and is in remission.   GYNECOLOGIC HISTORY: No LMP recorded. Patient has had a hysterectomy. Contraception:  hyst Menopausal hormone therapy:  Estradiol  Last 2 paps:  years ago History of abnormal Pap or positive HPV:  yes Mammogram:  07/25/23 Breast density Cat B, BIRADS Cat 1 neg Bone density:  06/16/21 - normal.  [facility].        OB History     Gravida  5   Para  4   Term      Preterm      AB  1   Living  4      SAB  1   IAB      Ectopic      Multiple      Live Births                 Patient Active Problem List   Diagnosis Date Noted   BMI 30.0-30.9,adult 05/17/2023   Vitamin D  deficiency 05/17/2023   Benign head tremor 11/14/2018   Degeneration of cervical intervertebral disc 06/12/2014   Insomnia 10/12/2013   Carpal tunnel syndrome 10/26/2012   S/P left unicompartmental knee replacement 06/13/2012   OA (osteoarthritis) of knee 03/09/2012   Post menopausal syndrome 03/09/2012   Primary hypertension 10/07/2011   Hyperlipidemia 10/07/2011   Internal hemorrhoids 10/07/2011    Past Medical History:  Diagnosis Date   Allergy    Arthritis    left knee   Cataract    History of abnormal cervical Pap smear    Had Hyst due to abnormal paps per patient   Hyperlipidemia    Hypertension    Insomnia     Past Surgical History:  Procedure Laterality Date   ABDOMINAL HYSTERECTOMY     COLONOSCOPY     KNEE SURGERY     left     Current Outpatient Medications  Medication Sig Dispense Refill   atorvastatin  (LIPITOR) 40 MG tablet TAKE 1 TABLET EVERY DAY 90 tablet 1   Cholecalciferol (VITAMIN D ) 50 MCG (2000 UT) CAPS Take 2,000 Units by mouth daily.     estradiol  (CLIMARA  - DOSED IN MG/24 HR) 0.0375 mg/24hr patch APPLY 1 PATCH TOPICALLY ONCE A WEEK 4 patch 0   lisinopril  (ZESTRIL ) 40 MG tablet Take 1 tablet (40 mg total) by mouth daily. **NEEDS TO BE SEEN BEFORE NEXT REFILL** 90 tablet 0   metoprolol  tartrate (LOPRESSOR ) 25 MG tablet Take 1 tablet (25 mg total) by mouth 2 (two) times daily. 180 tablet 1   Omega-3 Fatty Acids (FISH OIL) 1200 MG CAPS Take 1,200 mg by mouth daily.     Saccharomyces boulardii (PROBIOTIC) 250 MG CAPS Take by mouth.     No current facility-administered medications for this visit.     ALLERGIES: Patient has no known allergies.  Family History  Problem Relation Age of Onset   Heart  disease Mother    Kidney disease Father    Breast cancer Maternal Aunt    Colon cancer Neg Hx    Esophageal cancer Neg Hx    Stomach cancer Neg Hx    Colon polyps Neg Hx    Rectal cancer Neg Hx     Social History   Socioeconomic History   Marital status: Married    Spouse name: Hilario Lover   Number of children: 4   Years of education: Not on file   Highest education level: Not on file  Occupational History   Occupation: Retired  Tobacco Use   Smoking status: Never   Smokeless tobacco: Never  Vaping Use   Vaping status: Never Used  Substance and Sexual Activity   Alcohol use: No    Alcohol/week: 0.0 standard drinks of alcohol   Drug use: No   Sexual activity: Not Currently    Birth control/protection: None, Surgical    Comment: first intercourse <18,<5 sexual partners, no STD  Other Topics Concern   Not on file  Social History Narrative   Married x 57 years in 2022.   11 grandchildren   5 great grandchildren.   Social Drivers of Corporate investment banker Strain: Low Risk   (07/12/2023)   Overall Financial Resource Strain (CARDIA)    Difficulty of Paying Living Expenses: Not hard at all  Food Insecurity: No Food Insecurity (07/12/2023)   Hunger Vital Sign    Worried About Running Out of Food in the Last Year: Never true    Ran Out of Food in the Last Year: Never true  Transportation Needs: No Transportation Needs (07/12/2023)   PRAPARE - Administrator, Civil Service (Medical): No    Lack of Transportation (Non-Medical): No  Physical Activity: Insufficiently Active (07/12/2023)   Exercise Vital Sign    Days of Exercise per Week: 3 days    Minutes of Exercise per Session: 30 min  Stress: No Stress Concern Present (07/12/2023)   [employer] of Occupational Health - Occupational Stress Questionnaire    Feeling of Stress : Not at all  Social Connections: Socially Integrated (07/12/2023)   Social Connection and Isolation Panel [NHANES]    Frequency of Communication with Friends and Family: More than three times a week    Frequency of Social Gatherings with Friends and Family: Three times a week    Attends Religious Services: More than 4 times per year    Active Member of Clubs or Organizations: Yes    Attends Banker Meetings: More than 4 times per year    Marital Status: Married  Catering manager Violence: Not At Risk (07/12/2023)   Humiliation, Afraid, Rape, and Kick questionnaire    Fear of Current or Ex-Partner: No    Emotionally Abused: No    Physically Abused: No    Sexually Abused: No    Review of Systems  All other systems reviewed and are negative.   PHYSICAL EXAMINATION:   BP 130/84 (BP Location: Left Arm, Patient Position: Sitting)   Pulse 60   SpO2 95%     General appearance: alert, cooperative and appears stated age Head: Normocephalic, without obvious abnormality, atraumatic Neck: no adenopathy, supple, symmetrical, trachea midline and thyroid  normal to inspection and palpation Lungs: clear to auscultation  bilaterally Breasts: normal appearance, no masses or tenderness, No nipple retraction or dimpling, No nipple discharge or bleeding, No axillary or supraclavicular adenopathy Heart: regular rate and rhythm Abdomen: soft, non-tender, no masses,  no organomegaly Extremities: extremities normal, atraumatic, no cyanosis or edema Skin: Skin color, texture, turgor normal. No rashes or lesions Lymph nodes: Cervical, supraclavicular, and axillary nodes normal. No abnormal inguinal nodes palpated Neurologic: Grossly normal  Pelvic: External genitalia:  no lesions              Urethra:  normal appearing urethra with no masses, tenderness or lesions              Bartholins and Skenes: normal                 Vagina: normal appearing vagina with normal color and discharge, no lesions              Cervix: absent                Bimanual Exam:  Uterus:  absent              Adnexa: no mass, fullness, tenderness         Chaperone was present for exam:  Cottie Diss, CMA  ASSESSMENT:  Current use of ERT. Status post total abdominal hysterectomy.   Uncertain status of ovaries.  Intolerance of gabapentin .   PLAN:  Mammogram screening yearly.  Reviewed self breast exam.  Discused WHI and use of ERT which can increase risk of PE, DVT, and stroke. She will wean off her ERT.  New Rx for Vivelle  Dot 0.025 mg twice weekly.  #8, RF 3.  She will then stop her ERT.  We discussed importance of daily calcium  and vit D and doing regular exercise.  Follow up in 2 years for next breast and pelvic exam and follow up as needed.   28 min  total time was spent for this patient encounter, including preparation, face-to-face counseling with the patient, coordination of care, and documentation of the encounter.

## 2023-11-01 ENCOUNTER — Encounter: Payer: Self-pay | Admitting: Obstetrics and Gynecology

## 2023-11-01 ENCOUNTER — Ambulatory Visit: Admitting: Obstetrics and Gynecology

## 2023-11-01 ENCOUNTER — Ambulatory Visit: Admitting: Nurse Practitioner

## 2023-11-01 ENCOUNTER — Ambulatory Visit: Payer: Self-pay

## 2023-11-01 ENCOUNTER — Encounter: Payer: Self-pay | Admitting: Nurse Practitioner

## 2023-11-01 VITALS — BP 153/74 | HR 51 | Temp 97.7°F | Ht 59.0 in | Wt 152.6 lb

## 2023-11-01 VITALS — BP 130/84 | HR 60

## 2023-11-01 DIAGNOSIS — M79671 Pain in right foot: Secondary | ICD-10-CM | POA: Insufficient documentation

## 2023-11-01 DIAGNOSIS — M722 Plantar fascial fibromatosis: Secondary | ICD-10-CM

## 2023-11-01 DIAGNOSIS — Z5181 Encounter for therapeutic drug level monitoring: Secondary | ICD-10-CM | POA: Diagnosis not present

## 2023-11-01 DIAGNOSIS — Z79818 Long term (current) use of other agents affecting estrogen receptors and estrogen levels: Secondary | ICD-10-CM | POA: Diagnosis not present

## 2023-11-01 MED ORDER — ESTRADIOL 0.025 MG/24HR TD PTTW
1.0000 | MEDICATED_PATCH | TRANSDERMAL | 3 refills | Status: AC
Start: 2023-11-03 — End: ?

## 2023-11-01 NOTE — Telephone Encounter (Signed)
 Chief Complaint:  Right foot pain   Symptoms:  Pain, intermittently radiates to R. Leg when laying down.    Frequency:  Onset: 3 days ago  Pertinent Negatives:   Patient denies  -Discoloration of the leg, sharp pain, fever, cold/cool feeling of her foot or legs, rash.   Disposition: [ ] ED /[ ] Urgent Care (no appt availability in office) / [ X]Appointment(In office/virtual)/ [ ]  [facility]/ [ ] Home Care/ [ ] Refused Recommended Disposition /[ ] [facility]/ [ ]  Follow-up with PCP   Additional Notes:   Patient was scheduled for an appointment today with another provider in the office. Patient instructed to have someone else drive her. PT verbalize understanding, but noted her husband can't drive & nephew doesn't have a license. She will attempt to find someone else. Pt educated on s/s to be weary of that warrant emergent help/ED/UC. Pt verbalized understanding.    Complete triage note below:    Copied from CRM [id number]. Topic: Clinical - Red Word Triage >> Nov 01, 2023 11:22 AM Crispin Dolphin wrote: Red Word that prompted transfer to Nurse Triage: Pain in foot - can't keep shoe on - terrible Reason for Disposition  [1] SEVERE pain (e.g., excruciating, unable to do any normal activities) AND [2] not improved after 2 hours of pain medicine  Answer Assessment - Initial Assessment Questions 1. ONSET: "When did the pain start?"      3 days ago ( Saturday)    2. LOCATION: "Where is the pain located?"    ------Right foot ( during the day), when she lays down it comes up the leg ( intermittent pain in the leg)    3. PAIN: "How bad is the pain?"    (Scale 1-10; or mild, moderate, severe)  - MILD (1-3): doesn't interfere with normal activities.   - MODERATE (4-7): interferes with normal activities (e.g., work or school) or awakens from sleep, limping.   - SEVERE (8-10): excruciating pain, unable to do any normal activities, unable to walk.      ----- 7/10 ( Tylenol   last night..Some relief)   4. WORK OR EXERCISE: "Has there been any recent work or exercise that involved this part of the body?"      -----   5. CAUSE: "What do you think is causing the foot pain?"     ------   6. OTHER SYMPTOMS: "Do you have any other symptoms?" (e.g., leg pain, rash, fever, numbness)     ---- R. Leg.  Protocols used: Foot Pain-A-AH

## 2023-11-01 NOTE — Progress Notes (Signed)
 Acute Office Visit  Subjective:     Patient ID: Mary Loper. Wilkinson, female    DOB: 06/18/1945, 78 y.o.   MRN: [id number]  Chief Complaint  Patient presents with   foot pain    Right foot pain, some swelling, since Saturday     HPI Mary Wilkinson is a 78 year old female who presents on 11/01/2023 for an acute visit due to concerns about foot pain. She reports pain localized to the ball of her right foot, which worsens after prolonged standing. She denies any recent injury or trauma. The pain is described as sharp in nature and rated 2 out of 10 in intensity. Active Ambulatory Problems    Diagnosis Date Noted   Primary hypertension 10/07/2011   Hyperlipidemia 10/07/2011   Internal hemorrhoids 10/07/2011   OA (osteoarthritis) of knee 03/09/2012   Post menopausal syndrome 03/09/2012   Carpal tunnel syndrome 10/26/2012   Insomnia 10/12/2013   Degeneration of cervical intervertebral disc 06/12/2014   Benign head tremor 11/14/2018   S/P left unicompartmental knee replacement 06/13/2012   BMI 30.0-30.9,adult 05/17/2023   Vitamin D  deficiency 05/17/2023   Right foot pain 11/01/2023   Plantar fasciitis of right foot 11/02/2023   Resolved Ambulatory Problems    Diagnosis Date Noted   Tremor 03/09/2012   Headache 03/09/2012   Overweight 09/18/2012   Annual physical exam 10/12/2013   Neck pain 06/12/2014   Stroke-like symptoms 06/17/2014   Right leg pain 09/03/2020   Left knee pain 05/05/2011   Past Medical History:  Diagnosis Date   Allergy    Arthritis    Cataract    History of abnormal cervical Pap smear    Hypertension      Review of Systems  Constitutional:  Negative for chills and fever.  HENT:  Negative for congestion and sore throat.   Respiratory:  Negative for cough, shortness of breath and wheezing.   Cardiovascular:  Negative for chest pain and leg swelling.  Gastrointestinal:  Negative for nausea and vomiting.  Musculoskeletal:  Negative for falls.       Right  foot pain  Neurological:  Negative for dizziness and headaches.   Negative unless indicated in HPI    Objective:    BP (!) 153/74   Pulse (!) 51   Temp 97.7 F (36.5 C) (Temporal)   Ht 4\' 11"  (1.499 m)   Wt 152 lb 9.6 oz (69.2 kg)   SpO2 98%   BMI 30.82 kg/m  BP Readings from Last 3 Encounters:  11/01/23 (!) 153/74  11/01/23 130/84  09/13/23 132/73   Wt Readings from Last 3 Encounters:  11/01/23 152 lb 9.6 oz (69.2 kg)  09/13/23 151 lb 9.6 oz (68.8 kg)  09/12/23 145 lb (65.8 kg)      Physical Exam Vitals and nursing note reviewed.  Constitutional:      General: She is not in acute distress. HENT:     Head: Normocephalic and atraumatic.     Nose: Nose normal.  Eyes:     General: No scleral icterus.    Extraocular Movements: Extraocular movements intact.     Conjunctiva/sclera: Conjunctivae normal.     Pupils: Pupils are equal, round, and reactive to light.  Cardiovascular:     Heart sounds: Normal heart sounds.  Pulmonary:     Effort: Pulmonary effort is normal.     Breath sounds: Normal breath sounds.  Musculoskeletal:     Right foot: Normal range of motion. Tenderness present. No swelling or foot  drop. Normal pulse.     Left foot: Normal.       Legs:  Skin:    General: Skin is warm and dry.     Findings: No rash.  Neurological:     Mental Status: She is alert and oriented to person, place, and time.  Psychiatric:        Mood and Affect: Mood normal.        Behavior: Behavior normal.        Thought Content: Thought content normal.        Judgment: Judgment normal.     No results found for any visits on 11/01/23.      Assessment & Plan:  Right foot pain  Plantar fasciitis of right foot   Mary Wilkinson is a 78 yrs old [place] American female seen today fro right foot pain, no acute distress  - OTC Acetaminophen  500 mg PO every 6 hours as needed for pain. Do not exceed 3000 mg in 24 hours Footwear modification: Encourage use of well-cushioned,  supportive shoes with a wide toe box and shock-absorbing soles. Avoid high heels or unsupportive footwear. Orthotic support: Recommend over-the-counter metatarsal pads or custom orthotics to redistribute pressure away from the metatarsal heads. Activity modification: Advise minimizing prolonged standing and incorporating sitting breaks as needed throughout the day.  Non-Pharmacologic: -Ice the affected area for 15-20 minutes, 2-3 times daily to reduce inflammation. -Gentle stretching of the foot and calf muscles. -Consider physical therapy referral if no improvement within 2-3 weeks.  Patient Education: -Educated patient on importance of supportive footwear, use of orthotics, and self-care strategies. -Advised to monitor for signs of swelling, redness, or worsening pain, and to report any new symptoms immediately.  Return if symptoms worsen or fail to improve.  Mary Redd [place] Thompson, DNP [facility] [address] [place], [place] [address] [phone]  Note: This document was prepared by [employer] voice dictation technology and any errors that results from this process are unintentional.

## 2023-11-01 NOTE — Patient Instructions (Signed)

## 2023-11-01 NOTE — Telephone Encounter (Signed)
 Appointment scheduled.

## 2023-11-02 DIAGNOSIS — M722 Plantar fascial fibromatosis: Secondary | ICD-10-CM | POA: Insufficient documentation

## 2023-11-12 ENCOUNTER — Other Ambulatory Visit: Payer: Self-pay | Admitting: Obstetrics and Gynecology

## 2023-11-12 DIAGNOSIS — N951 Menopausal and female climacteric states: Secondary | ICD-10-CM

## 2023-11-15 ENCOUNTER — Ambulatory Visit (INDEPENDENT_AMBULATORY_CARE_PROVIDER_SITE_OTHER)

## 2023-11-15 ENCOUNTER — Ambulatory Visit: Payer: Medicare PPO | Admitting: Family Medicine

## 2023-11-15 ENCOUNTER — Encounter: Payer: Self-pay | Admitting: Family Medicine

## 2023-11-15 VITALS — BP 132/72 | HR 59 | Temp 97.7°F | Ht 59.0 in | Wt 148.4 lb

## 2023-11-15 DIAGNOSIS — R202 Paresthesia of skin: Secondary | ICD-10-CM | POA: Diagnosis not present

## 2023-11-15 DIAGNOSIS — Z6830 Body mass index (BMI) 30.0-30.9, adult: Secondary | ICD-10-CM | POA: Diagnosis not present

## 2023-11-15 DIAGNOSIS — M79671 Pain in right foot: Secondary | ICD-10-CM

## 2023-11-15 DIAGNOSIS — E7800 Pure hypercholesterolemia, unspecified: Secondary | ICD-10-CM

## 2023-11-15 DIAGNOSIS — E559 Vitamin D deficiency, unspecified: Secondary | ICD-10-CM

## 2023-11-15 DIAGNOSIS — I10 Essential (primary) hypertension: Secondary | ICD-10-CM | POA: Diagnosis not present

## 2023-11-15 DIAGNOSIS — M7989 Other specified soft tissue disorders: Secondary | ICD-10-CM | POA: Diagnosis not present

## 2023-11-15 LAB — LIPID PANEL

## 2023-11-15 MED ORDER — ATORVASTATIN CALCIUM 40 MG PO TABS: 40.0000 mg | ORAL_TABLET | Freq: Every day | ORAL | 3 refills | Status: DC

## 2023-11-15 MED ORDER — METOPROLOL TARTRATE 25 MG PO TABS: 25.0000 mg | ORAL_TABLET | Freq: Two times a day (BID) | ORAL | 3 refills | Status: DC

## 2023-11-15 MED ORDER — PREDNISONE 20 MG PO TABS: 40.0000 mg | ORAL_TABLET | Freq: Every day | ORAL | 0 refills | Status: AC

## 2023-11-15 MED ORDER — LISINOPRIL 40 MG PO TABS: 40.0000 mg | ORAL_TABLET | Freq: Every day | ORAL | 3 refills | Status: AC
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
## 2023-11-16 ENCOUNTER — Ambulatory Visit: Payer: Self-pay | Admitting: Family Medicine

## 2023-11-16 LAB — CMP14+EGFR
ALT: 27 IU/L (ref 0–32)
AST: 30 IU/L (ref 0–40)
Albumin: 4.4 g/dL (ref 3.8–4.8)
Alkaline Phosphatase: 70 IU/L (ref 44–121)
BUN/Creatinine Ratio: 12 (ref 12–28)
BUN: 11 mg/dL (ref 8–27)
Bilirubin Total: 0.8 mg/dL (ref 0.0–1.2)
CO2: 21 mmol/L (ref 20–29)
Calcium: 10 mg/dL (ref 8.7–10.3)
Chloride: 104 mmol/L (ref 96–106)
Creatinine, Ser: 0.95 mg/dL (ref 0.57–1.00)
Globulin, Total: 2.3 g/dL (ref 1.5–4.5)
Glucose: 83 mg/dL (ref 70–99)
Potassium: 4 mmol/L (ref 3.5–5.2)
Sodium: 140 mmol/L (ref 134–144)
Total Protein: 6.7 g/dL (ref 6.0–8.5)
eGFR: 62 mL/min/{1.73_m2} (ref >59)

## 2023-11-16 LAB — CBC WITH DIFFERENTIAL/PLATELET
Basophils Absolute: 0 10*3/uL (ref 0.0–0.2)
Basos: 0 %
EOS (ABSOLUTE): 0.1 10*3/uL (ref 0.0–0.4)
Eos: 1 %
Hematocrit: 38.9 % (ref 34.0–46.6)
Hemoglobin: 12.4 g/dL (ref 11.1–15.9)
Immature Grans (Abs): 0 10*3/uL (ref 0.0–0.1)
Immature Granulocytes: 0 %
Lymphocytes Absolute: 1.4 10*3/uL (ref 0.7–3.1)
Lymphs: 33 %
MCH: 28 pg (ref 26.6–33.0)
MCHC: 31.9 g/dL (ref 31.5–35.7)
MCV: 88 fL (ref 79–97)
Monocytes Absolute: 0.5 10*3/uL (ref 0.1–0.9)
Monocytes: 11 %
Neutrophils Absolute: 2.3 10*3/uL (ref 1.4–7.0)
Neutrophils: 55 %
Platelets: 243 10*3/uL (ref 150–450)
RBC: 4.43 x10E6/uL (ref 3.77–5.28)
RDW: 12.8 % (ref 11.7–15.4)
WBC: 4.3 10*3/uL (ref 3.4–10.8)

## 2023-11-16 LAB — THYROID PANEL WITH TSH
Free Thyroxine Index: 2.3 (ref 1.2–4.9)
T3 Uptake Ratio: 27 % (ref 24–39)
T4, Total: 8.4 ug/dL (ref 4.5–12.0)
TSH: 1.05 u[IU]/mL (ref 0.450–4.500)

## 2023-11-16 LAB — LIPID PANEL
Cholesterol, Total: 177 mg/dL (ref 100–199)
HDL: 60 mg/dL (ref >39)
LDL CALC COMMENT:: 3 ratio (ref 0.0–4.4)
LDL Chol Calc (NIH): 98 mg/dL (ref 0–99)
Triglycerides: 109 mg/dL (ref 0–149)
VLDL Cholesterol Cal: 19 mg/dL (ref 5–40)

## 2023-11-16 LAB — VITAMIN D 25 HYDROXY (VIT D DEFICIENCY, FRACTURES): Vit D, 25-Hydroxy: 48.3 ng/mL (ref 30.0–100.0)

## 2023-11-16 LAB — VITAMIN B12: Vitamin B-12: 527 pg/mL (ref 232–1245)

## 2023-12-13 ENCOUNTER — Ambulatory Visit: Admitting: Nurse Practitioner

## 2023-12-14 ENCOUNTER — Encounter: Payer: Self-pay | Admitting: Family Medicine

## 2023-12-14 ENCOUNTER — Ambulatory Visit: Admitting: Family Medicine

## 2023-12-14 VITALS — BP 137/61 | HR 55 | Temp 97.9°F | Ht 59.0 in | Wt 146.0 lb

## 2023-12-14 DIAGNOSIS — B353 Tinea pedis: Secondary | ICD-10-CM | POA: Diagnosis not present

## 2023-12-14 DIAGNOSIS — T148XXA Other injury of unspecified body region, initial encounter: Secondary | ICD-10-CM

## 2023-12-14 DIAGNOSIS — S90821A Blister (nonthermal), right foot, initial encounter: Secondary | ICD-10-CM | POA: Diagnosis not present

## 2023-12-14 MED ORDER — CLOTRIMAZOLE-BETAMETHASONE 1-0.05 % EX CREA
1.0000 | TOPICAL_CREAM | Freq: Two times a day (BID) | CUTANEOUS | 0 refills | Status: AC
Start: 2023-12-14 — End: 2023-12-28

## 2023-12-14 NOTE — Progress Notes (Signed)
   Acute Office Visit  Subjective:     Patient ID: Mary Wilkinson. [place], female    DOB: 1946/05/19, 78 y.o.   MRN: [id number]  Chief Complaint  Patient presents with   Foot Problem    HPI Patient is in today for peeling and itching on both feet. This has been reoccurring for some time. This is located on the bottom and sides of her feet. For the last 2 weeks, she has had small blisters along the inside of her right foot. This has been improving. She has not tried any remedies.   ROS As per HPI.      Objective:    BP 137/61   Pulse (!) 55   Temp 97.9 F (36.6 C) (Temporal)   Ht 4' 11 (1.499 m)   Wt 146 lb (66.2 kg)   SpO2 96%   BMI 29.49 kg/m    Physical Exam Vitals and nursing note reviewed.  Constitutional:      General: She is not in acute distress.    Appearance: She is not ill-appearing, toxic-appearing or diaphoretic.  Musculoskeletal:     Comments: Peeling skin to planter aspect of bilateral feet. Small fluid filled blisters along medial aspect of right foot. No signs of infection.   Skin:    General: Skin is warm and dry.  Neurological:     Mental Status: She is alert.  Psychiatric:        Mood and Affect: Mood normal.        Behavior: Behavior normal.     No results found for any visits on 12/14/23.      Assessment & Plan:   Arlesia was seen today for foot problem.  Diagnoses and all orders for this visit:  Tinea pedis of both feet Lotrisone as below. Keep feet clean, dry.  -     clotrimazole-betamethasone (LOTRISONE) cream; Apply 1 Application topically 2 (two) times daily for 14 days.  Friction blister No signs of infection. Avoid friction to feet.   Return to office for new or worsening symptoms, or if symptoms persist.   The patient indicates understanding of these issues and agrees with the plan.  Annabella HERO Search, FNP

## 2024-02-16 ENCOUNTER — Other Ambulatory Visit: Payer: Self-pay | Admitting: Family Medicine

## 2024-02-16 DIAGNOSIS — I10 Essential (primary) hypertension: Secondary | ICD-10-CM

## 2024-03-05 DIAGNOSIS — N182 Chronic kidney disease, stage 2 (mild): Secondary | ICD-10-CM | POA: Diagnosis not present

## 2024-03-05 DIAGNOSIS — N951 Menopausal and female climacteric states: Secondary | ICD-10-CM | POA: Diagnosis not present

## 2024-03-05 DIAGNOSIS — Z8249 Family history of ischemic heart disease and other diseases of the circulatory system: Secondary | ICD-10-CM | POA: Diagnosis not present

## 2024-03-05 DIAGNOSIS — Z818 Family history of other mental and behavioral disorders: Secondary | ICD-10-CM | POA: Diagnosis not present

## 2024-03-05 DIAGNOSIS — I129 Hypertensive chronic kidney disease with stage 1 through stage 4 chronic kidney disease, or unspecified chronic kidney disease: Secondary | ICD-10-CM | POA: Diagnosis not present

## 2024-03-05 DIAGNOSIS — R32 Unspecified urinary incontinence: Secondary | ICD-10-CM | POA: Diagnosis not present

## 2024-03-05 DIAGNOSIS — E663 Overweight: Secondary | ICD-10-CM | POA: Diagnosis not present

## 2024-03-05 DIAGNOSIS — E785 Hyperlipidemia, unspecified: Secondary | ICD-10-CM | POA: Diagnosis not present

## 2024-03-05 DIAGNOSIS — Z6828 Body mass index (BMI) 28.0-28.9, adult: Secondary | ICD-10-CM | POA: Diagnosis not present

## 2024-03-14 ENCOUNTER — Other Ambulatory Visit: Payer: Self-pay | Admitting: Obstetrics and Gynecology

## 2024-03-14 NOTE — Telephone Encounter (Signed)
.  Med refill request: Vivelle -Dot Last OV: 11/01/23 Next AEX: Not scheduled  Last MMG (if hormonal med) 07/25/23  Refill authorized: Please Advise?

## 2024-03-16 NOTE — Telephone Encounter (Signed)
 Spoke with the pt and she said Yes she is finished with the medication.

## 2024-03-28 ENCOUNTER — Emergency Department (HOSPITAL_COMMUNITY)
Admission: EM | Admit: 2024-03-28 | Discharge: 2024-03-29 | Disposition: A | Discharge status: Home / Self Care | Attending: Emergency Medicine | Admitting: Emergency Medicine

## 2024-03-28 DIAGNOSIS — R112 Nausea with vomiting, unspecified: Secondary | ICD-10-CM | POA: Diagnosis not present

## 2024-03-28 DIAGNOSIS — R197 Diarrhea, unspecified: Secondary | ICD-10-CM | POA: Diagnosis not present

## 2024-03-28 DIAGNOSIS — Z79899 Other long term (current) drug therapy: Secondary | ICD-10-CM | POA: Insufficient documentation

## 2024-03-28 DIAGNOSIS — R42 Dizziness and giddiness: Secondary | ICD-10-CM | POA: Diagnosis not present

## 2024-03-28 DIAGNOSIS — I10 Essential (primary) hypertension: Secondary | ICD-10-CM | POA: Diagnosis not present

## 2024-03-28 LAB — COMPREHENSIVE METABOLIC PANEL WITH GFR
ALT: 25 U/L (ref 0–44)
AST: 31 U/L (ref 15–41)
Albumin: 3.7 g/dL (ref 3.5–5.0)
Alkaline Phosphatase: 55 U/L (ref 38–126)
Anion gap: 13 (ref 5–15)
BUN: 15 mg/dL (ref 8–23)
CO2: 19 mmol/L — ABNORMAL LOW (ref 22–32)
Calcium: 8.6 mg/dL — ABNORMAL LOW (ref 8.9–10.3)
Chloride: 105 mmol/L (ref 98–111)
Creatinine, Ser: 0.84 mg/dL (ref 0.44–1.00)
GFR, Estimated: >60 mL/min (ref >60)
Glucose, Bld: 136 mg/dL — ABNORMAL HIGH (ref 70–99)
Potassium: 4 mmol/L (ref 3.5–5.1)
Sodium: 137 mmol/L (ref 135–145)
Total Bilirubin: 1.1 mg/dL (ref 0.0–1.2)
Total Protein: 6.3 g/dL — ABNORMAL LOW (ref 6.5–8.1)

## 2024-03-28 LAB — CBC WITH DIFFERENTIAL/PLATELET
Abs Immature Granulocytes: 0.04 K/uL (ref 0.00–0.07)
Basophils Absolute: 0 K/uL (ref 0.0–0.1)
Basophils Relative: 0 %
Eosinophils Absolute: 0 K/uL (ref 0.0–0.5)
Eosinophils Relative: 0 %
HCT: 37.4 % (ref 36.0–46.0)
Hemoglobin: 12.2 g/dL (ref 12.0–15.0)
Immature Granulocytes: 0 %
Lymphocytes Relative: 17 %
Lymphs Abs: 1.7 K/uL (ref 0.7–4.0)
MCH: 28.1 pg (ref 26.0–34.0)
MCHC: 32.6 g/dL (ref 30.0–36.0)
MCV: 86.2 fL (ref 80.0–100.0)
Monocytes Absolute: 0.6 K/uL (ref 0.1–1.0)
Monocytes Relative: 6 %
Neutro Abs: 7.5 K/uL (ref 1.7–7.7)
Neutrophils Relative %: 77 %
Platelets: 223 K/uL (ref 150–400)
RBC: 4.34 MIL/uL (ref 3.87–5.11)
RDW: 12.7 % (ref 11.5–15.5)
WBC: 9.9 K/uL (ref 4.0–10.5)
nRBC: 0 % (ref 0.0–0.2)

## 2024-03-28 LAB — I-STAT CHEM 8, ED
BUN: 17 mg/dL (ref 8–23)
Calcium, Ion: 1 mmol/L — ABNORMAL LOW (ref 1.15–1.40)
Chloride: 108 mmol/L (ref 98–111)
Creatinine, Ser: 0.8 mg/dL (ref 0.44–1.00)
Glucose, Bld: 136 mg/dL — ABNORMAL HIGH (ref 70–99)
HCT: 36 % (ref 36.0–46.0)
Hemoglobin: 12.2 g/dL (ref 12.0–15.0)
Potassium: 4 mmol/L (ref 3.5–5.1)
Sodium: 139 mmol/L (ref 135–145)
TCO2: 20 mmol/L — ABNORMAL LOW (ref 22–32)

## 2024-03-28 LAB — TROPONIN I (HIGH SENSITIVITY): Troponin I (High Sensitivity): 4 ng/L (ref <18)

## 2024-03-28 MED ORDER — ONDANSETRON HCL 4 MG/2ML IJ SOLN
4.0000 mg | Freq: Once | INTRAMUSCULAR | Status: AC
  Administered 2024-03-28: 4 mg via INTRAVENOUS
  Filled 2024-03-28: qty 2

## 2024-03-28 NOTE — ED Provider Notes (Incomplete)
 [place] EMERGENCY DEPARTMENT AT [facility] Provider Note   CSN: [id number] Arrival date & time: 03/28/24  2202     Patient presents with: Emesis   Mary Wilkinson is a 78 y.o. female.  Patient with past medical history significant for hypertension presents to the emergency department complaining of nausea, vomiting, diarrhea, and episode of dizziness.  Patient states she felt dizzy and had a sudden episode of diarrhea which was nonbloody.  She states that upon standing she felt nauseated and she has vomited 2-3 times.  She states that each time she is stood up she felt nauseated and eventually threw up.  Sitting still she has no dizziness.  She denies chest pain, shortness of breath, abdominal pain, fever, urinary symptoms.  She was noted to have a rectal temperature upon arrival of 96.4 F.  She denies any known sick contacts.  {Add pertinent medical, surgical, social history, OB history to HPI:32947}  Emesis      Prior to Admission medications   Medication Sig Start Date End Date Taking? Authorizing Provider  atorvastatin  (LIPITOR) 40 MG tablet Take 1 tablet (40 mg total) by mouth daily. 11/15/23   Severa Rock HERO, FNP  Cholecalciferol (VITAMIN D ) 50 MCG (2000 UT) CAPS Take 2,000 Units by mouth daily.    [provider]  estradiol  (VIVELLE -DOT) 0.025 MG/24HR Place 1 patch onto the skin 2 (two) times a week. 11/03/23   Cathlyn JAYSON Nikki Bobie FORBES, MD  lisinopril  (ZESTRIL ) 40 MG tablet Take 1 tablet (40 mg total) by mouth daily. 11/15/23   Severa Rock HERO, FNP  metoprolol  tartrate (LOPRESSOR ) 25 MG tablet Take 1 tablet by mouth twice daily 02/16/24   Rakes, Rock HERO, FNP  Omega-3 Fatty Acids (FISH OIL) 1200 MG CAPS Take 1,200 mg by mouth daily.    [provider]  Saccharomyces boulardii (PROBIOTIC) 250 MG CAPS Take by mouth.    [provider]    Allergies: Patient has no known allergies.    Review of Systems  Gastrointestinal:  Positive for vomiting.     Updated Vital Signs BP (!) 167/76   Pulse (!) 55   Temp (S) (!) 96.4 F (35.8 C) (Rectal)   Resp 20   SpO2 100%   Physical Exam Vitals and nursing note reviewed.  Constitutional:      General: She is not in acute distress.    Appearance: She is well-developed.  HENT:     Head: Normocephalic and atraumatic.  Eyes:     Conjunctiva/sclera: Conjunctivae normal.  Cardiovascular:     Rate and Rhythm: Normal rate and regular rhythm.  Pulmonary:     Effort: Pulmonary effort is normal. No respiratory distress.     Breath sounds: Normal breath sounds.  Abdominal:     Palpations: Abdomen is soft.     Tenderness: There is no abdominal tenderness.  Musculoskeletal:        General: No swelling.     Cervical back: Neck supple.     Right lower leg: No edema.     Left lower leg: No edema.  Skin:    General: Skin is warm and dry.     Capillary Refill: Capillary refill takes less than 2 seconds.  Neurological:     General: No focal deficit present.     Mental Status: She is alert.  Psychiatric:        Mood and Affect: Mood normal.     (all labs ordered are listed, but only abnormal  results are displayed) Labs Reviewed  CBC WITH DIFFERENTIAL/PLATELET  COMPREHENSIVE METABOLIC PANEL WITH GFR  I-STAT CHEM 8, ED  TROPONIN I (HIGH SENSITIVITY)    EKG: None  Radiology: No results found.  {Document cardiac monitor, telemetry assessment procedure when appropriate:32947} Procedures   Medications Ordered in the ED  ondansetron [facility]) injection 4 mg (has no administration in time range)      {Click here for ABCD2, HEART and other calculators REFRESH Note before signing:1}                              Medical Decision Making Amount and/or Complexity of Data Reviewed Labs: ordered.  Risk Prescription drug management.   This patient presents to the ED for concern of nausea, vomiting, diarrhea, this involves an extensive number of treatment options, and is a complaint  that carries with it a high risk of complications and morbidity.  The differential diagnosis includes gastroenteritis, ACS, cholecystitis, appendicitis, vertigo, others   Co morbidities / Chronic conditions that complicate the patient evaluation  Hypertension   Additional history obtained:  Additional history obtained from EMR  Lab Tests:  I Ordered, and personally interpreted labs.  The pertinent results include:  ***   Imaging Studies ordered:  I ordered imaging studies including ***  I independently visualized and interpreted imaging which showed *** I agree with the radiologist interpretation   Cardiac Monitoring: / EKG:  The patient was maintained on a cardiac monitor.  I personally viewed and interpreted the cardiac monitored which showed an underlying rhythm of: ***   Problem List / ED Course / Critical interventions / Medication management  *** I ordered medication including ***   Reevaluation of the patient after these medicines showed that the patient *** I have reviewed the patients home medicines and have made adjustments as needed   Consultations Obtained:  I requested consultation with the ***,  and discussed lab and imaging findings as well as pertinent plan - they recommend: ***   Social Determinants of Health:  ***   Test / Admission - Considered:  ***   {Document critical care time when appropriate  Document review of labs and clinical decision tools ie CHADS2VASC2, etc  Document your independent review of radiology images and any outside records  Document your discussion with family members, caretakers and with consultants  Document social determinants of health affecting pt's care  Document your decision making why or why not admission, treatments were needed:32947:::1}   Final diagnoses:  None    ED Discharge Orders     None

## 2024-03-28 NOTE — ED Provider Notes (Signed)
 [place] EMERGENCY DEPARTMENT AT [facility] Provider Note   CSN: [id number] Arrival date & time: 03/28/24  2202     Patient presents with: Emesis   Mary Wilkinson is a 78 y.o. female.  Patient with past medical history significant for hypertension presents to the emergency department complaining of nausea, vomiting, diarrhea, and episode of dizziness.  Patient states she felt dizzy and had a sudden episode of diarrhea which was nonbloody.  She states that upon standing she felt nauseated and she has vomited 2-3 times.  She states that each time she is stood up she felt nauseated and eventually threw up.  Sitting still she has no dizziness.  She denies chest pain, shortness of breath, abdominal pain, fever, urinary symptoms.  She was noted to have a rectal temperature upon arrival of 96.4 F.  She denies any known sick contacts.    Emesis      Prior to Admission medications   Medication Sig Start Date End Date Taking? Authorizing Provider  ondansetron (ZOFRAN-ODT) 4 MG disintegrating tablet Take 1 tablet (4 mg total) by mouth every 8 (eight) hours as needed for nausea or vomiting. 03/29/24  Yes Logan Ubaldo NOVAK, PA-C  atorvastatin  (LIPITOR) 40 MG tablet Take 1 tablet (40 mg total) by mouth daily. 11/15/23   Severa Rock HERO, FNP  Cholecalciferol (VITAMIN D ) 50 MCG (2000 UT) CAPS Take 2,000 Units by mouth daily.    [provider]  estradiol  (VIVELLE -DOT) 0.025 MG/24HR Place 1 patch onto the skin 2 (two) times a week. 11/03/23   Cathlyn JAYSON Nikki Bobie FORBES, MD  lisinopril  (ZESTRIL ) 40 MG tablet Take 1 tablet (40 mg total) by mouth daily. 11/15/23   Severa Rock HERO, FNP  metoprolol  tartrate (LOPRESSOR ) 25 MG tablet Take 1 tablet by mouth twice daily 02/16/24   Rakes, Rock HERO, FNP  Omega-3 Fatty Acids (FISH OIL) 1200 MG CAPS Take 1,200 mg by mouth daily.    [provider]  Saccharomyces boulardii (PROBIOTIC) 250 MG CAPS Take by mouth.    [provider]     Allergies: Patient has no known allergies.    Review of Systems  Gastrointestinal:  Positive for vomiting.    Updated Vital Signs BP 132/60   Pulse (!) 53   Temp (!) 97.1 F (36.2 C) (Oral)   Resp 15   SpO2 100%   Physical Exam Vitals and nursing note reviewed.  Constitutional:      General: She is not in acute distress.    Appearance: She is well-developed.  HENT:     Head: Normocephalic and atraumatic.  Eyes:     Conjunctiva/sclera: Conjunctivae normal.  Cardiovascular:     Rate and Rhythm: Normal rate and regular rhythm.  Pulmonary:     Effort: Pulmonary effort is normal. No respiratory distress.     Breath sounds: Normal breath sounds.  Abdominal:     Palpations: Abdomen is soft.     Tenderness: There is no abdominal tenderness.  Musculoskeletal:        General: No swelling.     Cervical back: Neck supple.     Right lower leg: No edema.     Left lower leg: No edema.  Skin:    General: Skin is warm and dry.     Capillary Refill: Capillary refill takes less than 2 seconds.  Neurological:     General: No focal deficit present.     Mental Status: She is alert.  Psychiatric:  Mood and Affect: Mood normal.     (all labs ordered are listed, but only abnormal results are displayed) Labs Reviewed  COMPREHENSIVE METABOLIC PANEL WITH GFR - Abnormal; Notable for the following components:      Result Value   CO2 19 (*)    Glucose, Bld 136 (*)    Calcium  8.6 (*)    Total Protein 6.3 (*)    All other components within normal limits  I-STAT CHEM 8, ED - Abnormal; Notable for the following components:   Glucose, Bld 136 (*)    Calcium , Ion 1.00 (*)    TCO2 20 (*)    All other components within normal limits  CBC WITH DIFFERENTIAL/PLATELET  TROPONIN I (HIGH SENSITIVITY)  TROPONIN I (HIGH SENSITIVITY)    EKG: None  Radiology: No results found.   Procedures   Medications Ordered in the ED  ondansetron [facility]) injection 4 mg (4 mg Intravenous  Given 03/28/24 2250)                                    Medical Decision Making Amount and/or Complexity of Data Reviewed Labs: ordered.  Risk Prescription drug management.   This patient presents to the ED for concern of nausea, vomiting, diarrhea, this involves an extensive number of treatment options, and is a complaint that carries with it a high risk of complications and morbidity.  The differential diagnosis includes gastroenteritis, ACS, cholecystitis, appendicitis, vertigo, others   Co morbidities / Chronic conditions that complicate the patient evaluation  Hypertension   Additional history obtained:  Additional history obtained from EMR  Lab Tests:  I Ordered, and personally interpreted labs.  The pertinent results include: Grossly unremarkable CMP, CBC, troponins of 4 and 5   Imaging Studies ordered:  Patient has no abdominal tenderness on examination.  No indication for imaging.  No signs of acute/surgical abdomen   Cardiac Monitoring: / EKG:  The patient was maintained on a cardiac monitor.  I personally viewed and interpreted the cardiac monitored which showed an underlying rhythm of: Sinus rhythm   Problem List / ED Course / Critical interventions / Medication management   I ordered medication including Zofran Reevaluation of the patient after these medicines showed that the patient improved I have reviewed the patients home medicines and have made adjustments as needed   Test / Admission - Considered:  Negative troponins x 2 with nonischemic EKG.  No sign of ACS.  No shortness of breath.  No chest pain.  No shortness of breath to suggest pneumonia or pulmonary embolism.  Patient with nausea, vomiting, diarrhea with normal CMP.  Most suspicious for a viral gastroenteritis at this time.  Patient with orthostatic vitals negative for significant change.  She is tolerating oral intake without difficulty and states she feels much better at this time.  She  was steady on her feet with no dizziness.  Patient appears stable for discharge home.  Zofran prescription provided.      Final diagnoses:  Nausea vomiting and diarrhea    ED Discharge Orders          Ordered    ondansetron (ZOFRAN-ODT) 4 MG disintegrating tablet  Every 8 hours PRN        03/29/24 0105               Logan Ubaldo NOVAK, PA-C 03/29/24 0105    Theadore Ozell HERO, MD 03/29/24 [phone]

## 2024-03-28 NOTE — ED Triage Notes (Signed)
 POV, Nausea, vomiting and diarrhea that started today, stated today at 2000. Report weakness since them

## 2024-03-29 LAB — TROPONIN I (HIGH SENSITIVITY): Troponin I (High Sensitivity): 5 ng/L (ref <18)

## 2024-03-29 MED ORDER — ONDANSETRON 4 MG PO TBDP: 4.0000 mg | ORAL_TABLET | Freq: Three times a day (TID) | ORAL | 0 refills | Status: AC | PRN

## 2024-03-29 NOTE — Discharge Instructions (Signed)
 Your workup this evening was reassuring.  I have prescribed Zofran to help with nausea.  You may take over-the-counter medication such as Imodium to help with diarrhea.  Follow-up as needed with your primary care provider.  This may be a viral illness which may take a few days to go out of your system.  If you develop worsening/life-threatening symptoms please return to the emergency department.

## 2024-03-29 NOTE — ED Notes (Signed)
 Pt ambulated in hall, no assistance needed, no difficulty reported or noted

## 2024-05-17 ENCOUNTER — Emergency Department (HOSPITAL_COMMUNITY)

## 2024-05-17 ENCOUNTER — Ambulatory Visit: Payer: Self-pay | Admitting: Family Medicine

## 2024-05-17 ENCOUNTER — Emergency Department (HOSPITAL_COMMUNITY)
Admission: EM | Admit: 2024-05-17 | Discharge: 2024-05-17 | Disposition: A | Discharge status: Home / Self Care | Attending: Emergency Medicine | Admitting: Emergency Medicine

## 2024-05-17 ENCOUNTER — Ambulatory Visit: Payer: Self-pay | Admitting: *Deleted

## 2024-05-17 DIAGNOSIS — E7800 Pure hypercholesterolemia, unspecified: Secondary | ICD-10-CM

## 2024-05-17 DIAGNOSIS — R112 Nausea with vomiting, unspecified: Secondary | ICD-10-CM | POA: Insufficient documentation

## 2024-05-17 DIAGNOSIS — I10 Essential (primary) hypertension: Secondary | ICD-10-CM | POA: Insufficient documentation

## 2024-05-17 DIAGNOSIS — R42 Dizziness and giddiness: Secondary | ICD-10-CM | POA: Insufficient documentation

## 2024-05-17 DIAGNOSIS — G459 Transient cerebral ischemic attack, unspecified: Secondary | ICD-10-CM | POA: Diagnosis not present

## 2024-05-17 DIAGNOSIS — I6782 Cerebral ischemia: Secondary | ICD-10-CM | POA: Diagnosis not present

## 2024-05-17 DIAGNOSIS — Z79899 Other long term (current) drug therapy: Secondary | ICD-10-CM | POA: Insufficient documentation

## 2024-05-17 LAB — COMPREHENSIVE METABOLIC PANEL WITH GFR
ALT: 20 U/L (ref 0–44)
AST: 27 U/L (ref 15–41)
Albumin: 4.5 g/dL (ref 3.5–5.0)
Alkaline Phosphatase: 73 U/L (ref 38–126)
Anion gap: 12 (ref 5–15)
BUN: 7 mg/dL — ABNORMAL LOW (ref 8–23)
CO2: 24 mmol/L (ref 22–32)
Calcium: 9.6 mg/dL (ref 8.9–10.3)
Chloride: 101 mmol/L (ref 98–111)
Creatinine, Ser: 0.77 mg/dL (ref 0.44–1.00)
GFR, Estimated: >60 mL/min (ref >60)
Glucose, Bld: 162 mg/dL — ABNORMAL HIGH (ref 70–99)
Potassium: 3.9 mmol/L (ref 3.5–5.1)
Sodium: 137 mmol/L (ref 135–145)
Total Bilirubin: 0.9 mg/dL (ref 0.0–1.2)
Total Protein: 7.5 g/dL (ref 6.5–8.1)

## 2024-05-17 LAB — URINALYSIS, W/ REFLEX TO CULTURE (INFECTION SUSPECTED)
Bacteria, UA: NONE SEEN
Bilirubin Urine: NEGATIVE
Glucose, UA: 50 mg/dL — AB
Hgb urine dipstick: NEGATIVE
Ketones, ur: 5 mg/dL — AB
Leukocytes,Ua: NEGATIVE
Nitrite: NEGATIVE
Protein, ur: NEGATIVE mg/dL
Specific Gravity, Urine: 1.008 (ref 1.005–1.030)
pH: 7 (ref 5.0–8.0)

## 2024-05-17 LAB — CBC WITH DIFFERENTIAL/PLATELET
Abs Immature Granulocytes: 0.03 K/uL (ref 0.00–0.07)
Basophils Absolute: 0 K/uL (ref 0.0–0.1)
Basophils Relative: 0 %
Eosinophils Absolute: 0 K/uL (ref 0.0–0.5)
Eosinophils Relative: 0 %
HCT: 40.7 % (ref 36.0–46.0)
Hemoglobin: 13 g/dL (ref 12.0–15.0)
Immature Granulocytes: 0 %
Lymphocytes Relative: 11 %
Lymphs Abs: 1.1 K/uL (ref 0.7–4.0)
MCH: 28.1 pg (ref 26.0–34.0)
MCHC: 31.9 g/dL (ref 30.0–36.0)
MCV: 87.9 fL (ref 80.0–100.0)
Monocytes Absolute: 0.6 K/uL (ref 0.1–1.0)
Monocytes Relative: 7 %
Neutro Abs: 7.7 K/uL (ref 1.7–7.7)
Neutrophils Relative %: 82 %
Platelets: 247 K/uL (ref 150–400)
RBC: 4.63 MIL/uL (ref 3.87–5.11)
RDW: 13 % (ref 11.5–15.5)
WBC: 9.5 K/uL (ref 4.0–10.5)
nRBC: 0 % (ref 0.0–0.2)

## 2024-05-17 LAB — LIPASE, BLOOD: Lipase: 44 U/L (ref 11–51)

## 2024-05-17 MED ORDER — MECLIZINE HCL 12.5 MG PO TABS: 12.5000 mg | ORAL_TABLET | Freq: Three times a day (TID) | ORAL | 0 refills | Status: DC | PRN

## 2024-05-17 MED ORDER — MECLIZINE HCL 25 MG PO TABS
25.0000 mg | ORAL_TABLET | Freq: Once | ORAL | Status: AC
  Administered 2024-05-17: 25 mg via ORAL
  Filled 2024-05-17: qty 1

## 2024-05-17 MED ORDER — SODIUM CHLORIDE 0.9 % IV BOLUS
1000.0000 mL | Freq: Once | INTRAVENOUS | Status: AC
  Administered 2024-05-17: 1000 mL via INTRAVENOUS

## 2024-05-17 MED ORDER — ONDANSETRON HCL 4 MG/2ML IJ SOLN
4.0000 mg | Freq: Once | INTRAMUSCULAR | Status: AC
  Administered 2024-05-17: 4 mg via INTRAVENOUS
  Filled 2024-05-17: qty 2

## 2024-05-17 MED ORDER — ONDANSETRON HCL 4 MG PO TABS: 4.0000 mg | ORAL_TABLET | Freq: Four times a day (QID) | ORAL | 0 refills | Status: DC

## 2024-05-17 NOTE — ED Provider Notes (Signed)
 Patient care was taken over from Dr. Laurice.  Patient presented with severe vertigo.  Was awaiting MRI.  MRI does not show any acute infarct or other acute abnormality.  She is symptomatically much better.  She is able to eat and drink without nausea or vomiting.  She is able to ambulate without ataxia.  She was discharged home in good condition.  She was given prescriptions by Dr. Laurice for symptomatic control.  She was encouraged to have close follow-up with her primary care doctor.  She actually has an upcoming appointment next week.  Return precautions were given.   Lenor Hollering, MD 05/17/24 [phone]

## 2024-05-17 NOTE — Telephone Encounter (Signed)
 Patient daughter would like to see if patient can tolerate more fluids without worsening diarrhea or vomiting and if not will take patient to ED. Please advise regarding questions/ rescheduling appt already scheduled for this pm. CAL Missy notified    FYI Only or Action Required?: FYI only for provider: ED advised.  Patient was last seen in primary care on 12/14/2023 by Joesph Annabella HERO, FNP.  Called Nurse Triage reporting Diarrhea.  Symptoms began today.  Interventions attempted: Rest, hydration, or home remedies.  Symptoms are: rapidly worsening.  Triage Disposition: Go to ED Now (or PCP Triage)  Patient/caregiver understands and will follow disposition?: Unsure        Copied from CRM #[id number]. Topic: Clinical - Red Word Triage >> May 17, 2024  9:32 AM Miquel SAILOR wrote: Red Word that prompted transfer to Nurse Triage: Pt daughter Erminio calling due to PT diarrhea/Dizziness/Vomiting has app for 12/04. But needs advice now requesting Reason for Disposition  Patient sounds very sick or weak to the triager  Answer Assessment - Initial Assessment Questions Recommended ED. Patient's daughter reports she will try to allow patient time to drink more fluids and if patient unable to tolerate fluids they will go to ED. Patient's daughter requesting if PCP would want to see patient today for appt this pm already scheduled if patient can tolerate fluids. Recommended OV can be rescheduled. Please advise . Patient daughter requesting call back #[phone] regarding appt.      1. DIARRHEA SEVERITY: How bad is the diarrhea? How many more stools have you had in the past 24 hours than normal?      Started this am multiple times loose  2. ONSET: When did the diarrhea begin?      This am  3. STOOL DESCRIPTION:  How loose or watery is the diarrhea? What is the stool color? Is there any blood or mucous in the stool?     Loose diarrhea, when she moves makes her nausea  4. VOMITING:  Are you also vomiting? If Yes, ask: How many times in the past 24 hours?      Yes 2-3 times  5. ABDOMEN PAIN: Are you having any abdomen pain? If Yes, ask: What does it feel like? (e.g., crampy, dull, intermittent, constant)      No pain 6. ABDOMEN PAIN SEVERITY: If present, ask: How bad is the pain?  (e.g., Scale 1-10; mild, moderate, or severe)     na 7. ORAL INTAKE: If vomiting, Have you been able to drink liquids? How much liquids have you had in the past 24 hours?     Drinking water but goes straight  8. HYDRATION: Any signs of dehydration? (e.g., dry mouth [not just dry lips], too weak to stand, dizziness, new weight loss) When did you last urinate?     Dizziness, weakness to stand  9. EXPOSURE: Have you traveled to a foreign country recently? Have you been exposed to anyone with diarrhea? Could you have eaten any food that was spoiled?     na 10. ANTIBIOTIC USE: Are you taking antibiotics now or have you taken antibiotics in the past 2 months?       na 11. OTHER SYMPTOMS: Do you have any other symptoms? (e.g., fever, blood in stool)       Vomiting yellow emesis, diarrhea and dizziness weakness with standing and daughter assisting patient due to dizziness 12. PREGNANCY: Is there any chance you are pregnant? When was your last menstrual period?  na  Protocols used: [facility]

## 2024-05-17 NOTE — ED Provider Notes (Signed)
 [place] EMERGENCY DEPARTMENT AT [facility] Provider Note   CSN: [id number] Arrival date & time: 05/17/24  1055     Patient presents with: Emesis and Diarrhea   Mary Wilkinson is a 78 y.o. female.  {Add pertinent medical, surgical, social history, OB history to HPI:2922} 78 year old female with prior medical history as detailed below presents for evaluation.  Patient reports that she got up this morning around 7:00.  She put wood in her stove and then went back to bed.  At 9:00 in the morning she was awakened with acute onset significant vertiginous symptoms and associated nausea and vomiting.  She reports that if she opens her eyes she has to throw up.  She denies headache or pain.  She denies fever.  She denies abdominal pain.  She denies diarrhea.  She did not take anything at home for her symptoms.  She reports that she feels like the room is spinning around her.  PMH includes HTN and elevated cholesterol.  The history is provided by the patient and medical records.       Prior to Admission medications   Medication Sig Start Date End Date Taking? Authorizing Provider  atorvastatin  (LIPITOR) 40 MG tablet Take 1 tablet (40 mg total) by mouth daily. 11/15/23   Severa Rock HERO, FNP  Cholecalciferol (VITAMIN D ) 50 MCG (2000 UT) CAPS Take 2,000 Units by mouth daily.    [provider]  estradiol  (VIVELLE -DOT) 0.025 MG/24HR Place 1 patch onto the skin 2 (two) times a week. 11/03/23   Cathlyn JAYSON Nikki Bobie FORBES, MD  lisinopril  (ZESTRIL ) 40 MG tablet Take 1 tablet (40 mg total) by mouth daily. 11/15/23   Severa Rock HERO, FNP  metoprolol  tartrate (LOPRESSOR ) 25 MG tablet Take 1 tablet by mouth twice daily 02/16/24   Rakes, Rock HERO, FNP  Omega-3 Fatty Acids (FISH OIL) 1200 MG CAPS Take 1,200 mg by mouth daily.    [provider]  ondansetron  (ZOFRAN -ODT) 4 MG disintegrating tablet Take 1 tablet (4 mg total) by mouth every 8 (eight) hours as needed for nausea or  vomiting. 03/29/24   Logan Ubaldo NOVAK, PA-C  Saccharomyces boulardii (PROBIOTIC) 250 MG CAPS Take by mouth.    [provider]    Allergies: Patient has no known allergies.    Review of Systems  All other systems reviewed and are negative.   Updated Vital Signs BP (!) 178/78   Pulse (!) 50   Temp 97.7 F (36.5 C) (Oral)   Resp 18   SpO2 100%   Physical Exam Vitals and nursing note reviewed.  Constitutional:      General: She is not in acute distress.    Appearance: She is well-developed.  HENT:     Head: Normocephalic and atraumatic.  Eyes:     Conjunctiva/sclera: Conjunctivae normal.  Cardiovascular:     Rate and Rhythm: Normal rate and regular rhythm.     Heart sounds: No murmur heard. Pulmonary:     Effort: Pulmonary effort is normal. No respiratory distress.     Breath sounds: Normal breath sounds.  Abdominal:     Palpations: Abdomen is soft.     Tenderness: There is no abdominal tenderness.  Musculoskeletal:        General: No swelling.     Cervical back: Neck supple.  Skin:    General: Skin is warm and dry.     Capillary Refill: Capillary refill takes less than 2 seconds.  Neurological:  Mental Status: She is alert.  Psychiatric:        Mood and Affect: Mood normal.     (all labs ordered are listed, but only abnormal results are displayed) Labs Reviewed  CBC WITH DIFFERENTIAL/PLATELET  COMPREHENSIVE METABOLIC PANEL WITH GFR  LIPASE, BLOOD  URINALYSIS, W/ REFLEX TO CULTURE (INFECTION SUSPECTED)    EKG: None  Radiology: No results found.  {Document cardiac monitor, telemetry assessment procedure when appropriate:32947} Procedures   Medications Ordered in the ED  ondansetron  (ZOFRAN ) injection 4 mg (has no administration in time range)  sodium chloride  0.9 % bolus 1,000 mL (has no administration in time range)  meclizine  (ANTIVERT ) tablet 25 mg (has no administration in time range)      {Click here for ABCD2, HEART and other  calculators REFRESH Note before signing:1}                              Medical Decision Making Patient with acute onset symptoms and associated nausea and vomiting.  Initial CT imaging of the brain is without acute abnormality.  Patient is feeling improved with Zofran  and meclizine .  Obtained screening labs are reassuringly without acute abnormality.  Given patient's age and acute onset of vertigo will obtain MRI to rule out possible central cause of vertigo.  Oncoming ED provider aware of case.  Amount and/or Complexity of Data Reviewed Labs: ordered. Radiology: ordered.  Risk Prescription drug management.     {Document critical care time when appropriate  Document review of labs and clinical decision tools ie CHADS2VASC2, etc  Document your independent review of radiology images and any outside records  Document your discussion with family members, caretakers and with consultants  Document social determinants of health affecting pt's care  Document your decision making why or why not admission, treatments were needed:32947:::1}   Final diagnoses:  Vertigo  Nausea and vomiting, unspecified vomiting type    ED Discharge Orders     None

## 2024-05-17 NOTE — Telephone Encounter (Signed)
 Pt has apt for today. LS

## 2024-05-17 NOTE — ED Triage Notes (Signed)
 Pt states that at 0900 she was awoken with N/V/D. Pt denies abd pain, no CP. No suspicious food intake or known sick contacts per pt. No URI symptoms per pt.

## 2024-05-17 NOTE — ED Notes (Signed)
 Pt to CT

## 2024-05-17 NOTE — Discharge Instructions (Signed)
 Return for any problem.  ?

## 2024-05-31 ENCOUNTER — Ambulatory Visit: Payer: Self-pay | Admitting: Family Medicine

## 2024-05-31 ENCOUNTER — Ambulatory Visit: Admitting: Family Medicine

## 2024-05-31 VITALS — BP 185/82 | HR 58 | Temp 97.3°F | Ht 59.0 in | Wt 151.4 lb

## 2024-05-31 DIAGNOSIS — R7309 Other abnormal glucose: Secondary | ICD-10-CM | POA: Diagnosis not present

## 2024-05-31 DIAGNOSIS — I10 Essential (primary) hypertension: Secondary | ICD-10-CM | POA: Diagnosis not present

## 2024-05-31 DIAGNOSIS — R42 Dizziness and giddiness: Secondary | ICD-10-CM

## 2024-05-31 LAB — BAYER DCA HB A1C WAIVED: HB A1C (BAYER DCA - WAIVED): 5.2 % (ref 4.8–5.6)

## 2024-05-31 MED ORDER — METOPROLOL TARTRATE 25 MG PO TABS: 25.0000 mg | ORAL_TABLET | Freq: Two times a day (BID) | ORAL | 1 refills | Status: DC

## 2024-05-31 NOTE — Progress Notes (Signed)
 Subjective:  Patient ID: Mary Wilkinson. Marceaux, female    DOB: March 30, 1946, 78 y.o.   MRN: [id number]  Patient Care Team: Severa Rock HERO, FNP as PCP - General (Family Medicine) Cathlyn JAYSON Nikki Bobie FORBES, MD as Consulting Physician (Obstetrics and Gynecology)   Chief Complaint:  ER follow up  (05/17/2024 (7 hours)/[facility]- vertigo )   HPI: Mary Wilkinson. Myung is a 78 y.o. female presenting on 05/31/2024 for ER follow up  (05/17/2024 (7 hours)/[place] [facility]- vertigo )   Mary Wilkinson. Sholl is a 78 year old female who presents with dizziness and vertigo.  She has been experiencing dizziness and vertigo, leading to hospital visits at [facility] in October and [facility] Wil[facility] on December 4th. During these visits, she experienced nausea and vomiting. Zofran  was administered, which alleviated her symptoms, along with IV fluids. Meclizine  was also given, but it increased her dizziness, leading to its discontinuation.  Her dizziness episodes occur intermittently, with significant episodes approximately every three weeks. A CT scan and MRI of her head were performed during her hospital stay, both of which returned normal results.  Her blood sugar was elevated at 162 during her last hospital visit. She reports that her blood sugar has been high during previous hospital visits as well.  She has a history of hypertension and is currently taking lisinopril  daily. She has only been taking her BB once daily.   She experiences occasional headaches that come and go. No chest pain or leg swelling.          Relevant past medical, surgical, family, and social history reviewed and updated as indicated.  Allergies and medications reviewed and updated. Data reviewed: Chart in Epic.   Past Medical History:  Diagnosis Date   Allergy    Arthritis    left knee   Cataract    History of abnormal cervical Pap smear    Had Hyst due to  abnormal paps per patient   Hyperlipidemia    Hypertension    Insomnia     Past Surgical History:  Procedure Laterality Date   ABDOMINAL HYSTERECTOMY     COLONOSCOPY     KNEE SURGERY     left    Social History   Socioeconomic History   Marital status: Married    Spouse name: Jayson   Number of children: 4   Years of education: Not on file   Highest education level: Not on file  Occupational History   Occupation: Retired  Tobacco Use   Smoking status: Never   Smokeless tobacco: Never  Vaping Use   Vaping status: Never Used  Substance and Sexual Activity   Alcohol use: No    Alcohol/week: 0.0 standard drinks of alcohol   Drug use: No   Sexual activity: Not Currently    Birth control/protection: None, Surgical    Comment: first intercourse <16,<5 sexual partners, no STD  Other Topics Concern   Not on file  Social History Narrative   Married x 57 years in 2022.   11 grandchildren   5 great grandchildren.   Social Drivers of Health   Tobacco Use: Low Risk (05/31/2024)   Patient History    Smoking Tobacco Use: Never    Smokeless Tobacco Use: Never    Passive Exposure: Not on file  Financial Resource Strain: Low Risk (07/12/2023)   Overall Financial Resource Strain (CARDIA)    Difficulty of Paying Living  Expenses: Not hard at all  Food Insecurity: No Food Insecurity (07/12/2023)   Hunger Vital Sign    Worried About Running Out of Food in the Last Year: Never true    Ran Out of Food in the Last Year: Never true  Transportation Needs: No Transportation Needs (07/12/2023)   PRAPARE - Administrator, Civil Service (Medical): No    Lack of Transportation (Non-Medical): No  Physical Activity: Insufficiently Active (07/12/2023)   Exercise Vital Sign    Days of Exercise per Week: 3 days    Minutes of Exercise per Session: 30 min  Stress: No Stress Concern Present (07/12/2023)   [employer] of Occupational Health - Occupational Stress Questionnaire     Feeling of Stress : Not at all  Social Connections: Socially Integrated (07/12/2023)   Social Connection and Isolation Panel    Frequency of Communication with Friends and Family: More than three times a week    Frequency of Social Gatherings with Friends and Family: Three times a week    Attends Religious Services: More than 4 times per year    Active Member of Clubs or Organizations: Yes    Attends Banker Meetings: More than 4 times per year    Marital Status: Married  Catering Manager Violence: Not At Risk (07/12/2023)   Humiliation, Afraid, Rape, and Kick questionnaire    Fear of Current or Ex-Partner: No    Emotionally Abused: No    Physically Abused: No    Sexually Abused: No  Depression (PHQ2-9): Low Risk (05/31/2024)   Depression (PHQ2-9)    PHQ-2 Score: 0  Alcohol Screen: Low Risk (07/12/2023)   Alcohol Screen    Last Alcohol Screening Score (AUDIT): 0  Housing: Unknown (07/12/2023)   Housing Stability Vital Sign    Unable to Pay for Housing in the Last Year: No    Number of Times Moved in the Last Year: Not on file    Homeless in the Last Year: No  Utilities: Not At Risk (07/12/2023)   AHC Utilities    Threatened with loss of utilities: No  Health Literacy: Adequate Health Literacy (07/12/2023)   B1300 Health Literacy    Frequency of need for help with medical instructions: Never    Outpatient Encounter Medications as of 05/31/2024  Medication Sig   atorvastatin  (LIPITOR) 40 MG tablet Take 1 tablet (40 mg total) by mouth daily.   Cholecalciferol (VITAMIN D ) 50 MCG (2000 UT) CAPS Take 2,000 Units by mouth daily.   estradiol  (VIVELLE -DOT) 0.025 MG/24HR Place 1 patch onto the skin 2 (two) times a week.   lisinopril  (ZESTRIL ) 40 MG tablet Take 1 tablet (40 mg total) by mouth daily.   Omega-3 Fatty Acids (FISH OIL) 1200 MG CAPS Take 1,200 mg by mouth daily.   ondansetron  (ZOFRAN ) 4 MG tablet Take 1 tablet (4 mg total) by mouth every 6 (six) hours.   ondansetron   (ZOFRAN -ODT) 4 MG disintegrating tablet Take 1 tablet (4 mg total) by mouth every 8 (eight) hours as needed for nausea or vomiting.   Saccharomyces boulardii (PROBIOTIC) 250 MG CAPS Take by mouth.   metoprolol  tartrate (LOPRESSOR ) 25 MG tablet Take 1 tablet (25 mg total) by mouth 2 (two) times daily.   [DISCONTINUED] meclizine  (ANTIVERT ) 12.5 MG tablet Take 1 tablet (12.5 mg total) by mouth 3 (three) times daily as needed for dizziness.   [DISCONTINUED] metoprolol  tartrate (LOPRESSOR ) 25 MG tablet Take 1 tablet by mouth twice daily   No facility-administered  encounter medications on file as of 05/31/2024.    Allergies[1]  Pertinent ROS per HPI, otherwise unremarkable      Objective:  BP (!) 185/82   Pulse (!) 58   Temp (!) 97.3 F (36.3 C)   Ht 4' 11 (1.499 m)   Wt 151 lb 6.4 oz (68.7 kg)   SpO2 99%   BMI 30.58 kg/m    Wt Readings from Last 3 Encounters:  05/31/24 151 lb 6.4 oz (68.7 kg)  12/14/23 146 lb (66.2 kg)  11/15/23 148 lb 6.4 oz (67.3 kg)    Physical Exam Constitutional:      General: She is not in acute distress.    Appearance: Normal appearance. She is not ill-appearing, toxic-appearing or diaphoretic.  HENT:     Head: Normocephalic and atraumatic.     Right Ear: Tympanic membrane, ear canal and external ear normal.     Left Ear: Tympanic membrane, ear canal and external ear normal.     Nose: Nose normal.     Mouth/Throat:     Mouth: Mucous membranes are moist.  Eyes:     Extraocular Movements: Extraocular movements intact.     Conjunctiva/sclera: Conjunctivae normal.     Pupils: Pupils are equal, round, and reactive to light.  Cardiovascular:     Rate and Rhythm: Normal rate and regular rhythm.     Heart sounds: Normal heart sounds.  Pulmonary:     Effort: Pulmonary effort is normal.     Breath sounds: Normal breath sounds.  Musculoskeletal:        General: Normal range of motion.     Cervical back: Neck supple.     Right lower leg: No edema.      Left lower leg: No edema.  Skin:    General: Skin is warm and dry.     Capillary Refill: Capillary refill takes less than 2 seconds.  Neurological:     General: No focal deficit present.     Mental Status: She is alert and oriented to person, place, and time.     Cranial Nerves: No cranial nerve deficit.     Sensory: No sensory deficit.     Motor: No weakness.     Gait: Gait normal.  Psychiatric:        Mood and Affect: Mood normal.        Behavior: Behavior normal.        Thought Content: Thought content normal.        Judgment: Judgment normal.     Results for orders placed or performed during the hospital encounter of 05/17/24  CBC with Differential   Collection Time: 05/17/24 12:29 PM  Result Value Ref Range   WBC 9.5 4.0 - 10.5 K/uL   RBC 4.63 3.87 - 5.11 MIL/uL   Hemoglobin 13.0 12.0 - 15.0 g/dL   HCT 59.2 63.9 - 53.9 %   MCV 87.9 80.0 - 100.0 fL   MCH 28.1 26.0 - 34.0 pg   MCHC 31.9 30.0 - 36.0 g/dL   RDW 86.9 88.4 - 84.4 %   Platelets 247 150 - 400 K/uL   nRBC 0.0 0.0 - 0.2 %   Neutrophils Relative % 82 %   Neutro Abs 7.7 1.7 - 7.7 K/uL   Lymphocytes Relative 11 %   Lymphs Abs 1.1 0.7 - 4.0 K/uL   Monocytes Relative 7 %   Monocytes Absolute 0.6 0.1 - 1.0 K/uL   Eosinophils Relative 0 %   Eosinophils Absolute 0.0  0.0 - 0.5 K/uL   Basophils Relative 0 %   Basophils Absolute 0.0 0.0 - 0.1 K/uL   Immature Granulocytes 0 %   Abs Immature Granulocytes 0.03 0.00 - 0.07 K/uL  Comprehensive metabolic panel   Collection Time: 05/17/24 12:29 PM  Result Value Ref Range   Sodium 137 135 - 145 mmol/L   Potassium 3.9 3.5 - 5.1 mmol/L   Chloride 101 98 - 111 mmol/L   CO2 24 22 - 32 mmol/L   Glucose, Bld 162 (H) 70 - 99 mg/dL   BUN 7 (L) 8 - 23 mg/dL   Creatinine, Ser 9.22 0.44 - 1.00 mg/dL   Calcium  9.6 8.9 - 10.3 mg/dL   Total Protein 7.5 6.5 - 8.1 g/dL   Albumin 4.5 3.5 - 5.0 g/dL   AST 27 15 - 41 U/L   ALT 20 0 - 44 U/L   Alkaline Phosphatase 73 38 - 126 U/L    Total Bilirubin 0.9 0.0 - 1.2 mg/dL   GFR, Estimated >39 >39 mL/min   Anion gap 12 5 - 15  Lipase, blood   Collection Time: 05/17/24 12:29 PM  Result Value Ref Range   Lipase 44 11 - 51 U/L  Urinalysis, w/ Reflex to Culture (Infection Suspected) -Urine, Clean Catch   Collection Time: 05/17/24  4:41 PM  Result Value Ref Range   Specimen Source URINE, CLEAN CATCH    Color, Urine STRAW (A) YELLOW   APPearance CLEAR CLEAR   Specific Gravity, Urine 1.008 1.005 - 1.030   pH 7.0 5.0 - 8.0   Glucose, UA 50 (A) NEGATIVE mg/dL   Hgb urine dipstick NEGATIVE NEGATIVE   Bilirubin Urine NEGATIVE NEGATIVE   Ketones, ur 5 (A) NEGATIVE mg/dL   Protein, ur NEGATIVE NEGATIVE mg/dL   Nitrite NEGATIVE NEGATIVE   Leukocytes,Ua NEGATIVE NEGATIVE   RBC / HPF 0-5 0 - 5 RBC/hpf   WBC, UA 0-5 0 - 5 WBC/hpf   Bacteria, UA NONE SEEN NONE SEEN   Squamous Epithelial / HPF 0-5 0 - 5 /HPF   Mucus PRESENT    Hyaline Casts, UA PRESENT        Pertinent labs & imaging results that were available during my care of the patient were reviewed by me and considered in my medical decision making.  Assessment & Plan:  Denika was seen today for er follow up .  Diagnoses and all orders for this visit:  Primary hypertension -     metoprolol  tartrate (LOPRESSOR ) 25 MG tablet; Take 1 tablet (25 mg total) by mouth 2 (two) times daily.  Elevated glucose level -     Bayer DCA Hb A1c Waived  Vertigo Recurrent vertigo with episodes of dizziness, nausea, and vomiting. Previous treatment with meclizine  exacerbated dizziness. Zofran  was effective for nausea and vomiting during hospital visits. CT and MRI of the head were normal, ruling out stroke. - Recommended trial of non-drowsy dramamine or children's dramamine for motion sickness. - Prescribed Zofran  for nausea and vomiting.  Primary hypertension Blood pressure is elevated today. Previously controlled with metoprolol  and lisinopril . Currently taking metoprolol  once  daily, which may be insufficient. - Instructed to restart metoprolol  twice daily (morning and night). - Continue lisinopril  daily. - Monitor blood pressure at home and report if not controlled.  Evaluation of elevated glucose Elevated blood glucose noted during recent hospital visits, with a reading of 162 mg/dL. Concern for transient elevation due to illness rather than chronic hyperglycemia. - Ordered finger stick to check  A1c. - Monitor blood glucose levels.          Continue all other maintenance medications.  Follow up plan: Return in about 3 months (around 08/29/2024), or if symptoms worsen or fail to improve, for HTN.   Continue healthy lifestyle choices, including diet (rich in fruits, vegetables, and lean proteins, and low in salt and simple carbohydrates) and exercise (at least 30 minutes of moderate physical activity daily).  Educational handout given for DASH diet, HTN  The above assessment and management plan was discussed with the patient. The patient verbalized understanding of and has agreed to the management plan. Patient is aware to call the clinic if they develop any new symptoms or if symptoms persist or worsen. Patient is aware when to return to the clinic for a follow-up visit. Patient educated on when it is appropriate to go to the emergency department.   Rosaline Bruns, FNP-C [facility] [phone]     [1] No Known Allergies

## 2024-05-31 NOTE — Patient Instructions (Signed)
 Goal BP:  Less than 130/80  Take your medications faithfully as prescribed. Maintain a healthy weight. Get at least 150 minutes of aerobic exercise per week. Minimize salt intake, less than 2000 mg per day. Minimize alcohol intake.  DASH Eating Plan DASH stands for Dietary Approaches to Stop Hypertension. The DASH eating plan is a healthy eating plan that has been shown to reduce high blood pressure (hypertension). Additional health benefits may include reducing the risk of type 2 diabetes mellitus, heart disease, and stroke. The DASH eating plan may also help with weight loss.  WHAT DO I NEED TO KNOW ABOUT THE DASH EATING PLAN? For the DASH eating plan, you will follow these general guidelines: Choose foods with a percent daily value for sodium of less than 5% (as listed on the food label). Use salt-free seasonings or herbs instead of table salt or sea salt. Check with your health care provider or pharmacist before using salt substitutes. Eat lower-sodium products, often labeled as lower sodium or no salt added. Eat fresh foods. Eat more vegetables, fruits, and low-fat dairy products. Choose whole grains. Look for the word whole as the first word in the ingredient list. Choose fish and skinless chicken or [place] more often than red meat. Limit fish, poultry, and meat to 6 oz (170 g) each day. Limit sweets, desserts, sugars, and sugary drinks. Choose heart-healthy fats. Limit cheese to 1 oz (28 g) per day. Eat more home-cooked food and less restaurant, buffet, and fast food. Limit fried foods. Cook foods using methods other than frying. Limit canned vegetables. If you do use them, rinse them well to decrease the sodium. When eating at a restaurant, ask that your food be prepared with less salt, or no salt if possible.  WHAT FOODS CAN I EAT? Seek help from a dietitian for individual calorie needs.  Grains Whole grain or whole wheat bread. Hoshino rice. Whole grain or whole  wheat pasta. Quinoa, bulgur, and whole grain cereals. Low-sodium cereals. Corn or whole wheat flour tortillas. Whole grain cornbread. Whole grain crackers. Low-sodium crackers.  Vegetables Fresh or frozen vegetables (raw, steamed, roasted, or grilled). Low-sodium or reduced-sodium tomato and vegetable juices. Low-sodium or reduced-sodium tomato sauce and paste. Low-sodium or reduced-sodium canned vegetables.   Fruits All fresh, canned (in natural juice), or frozen fruits.  Meat and Other Protein Products Ground beef (85% or leaner), grass-fed beef, or beef trimmed of fat. Skinless chicken or [place]. Ground chicken or [place]. Pork trimmed of fat. All fish and seafood. Eggs. Dried beans, peas, or lentils. Unsalted nuts and seeds. Unsalted canned beans.  Dairy Low-fat dairy products, such as skim or 1% milk, 2% or reduced-fat cheeses, low-fat ricotta or cottage cheese, or plain low-fat yogurt. Low-sodium or reduced-sodium cheeses.  Fats and Oils Tub margarines without trans fats. Light or reduced-fat mayonnaise and salad dressings (reduced sodium). Avocado. Safflower, olive, or canola oils. Natural peanut or almond butter.  Other Unsalted popcorn and pretzels. The items listed above may not be a complete list of recommended foods or beverages. Contact your dietitian for more options.  WHAT FOODS ARE NOT RECOMMENDED?  Grains White bread. White pasta. White rice. Refined cornbread. Bagels and croissants. Crackers that contain trans fat.  Vegetables Creamed or fried vegetables. Vegetables in a cheese sauce. Regular canned vegetables. Regular canned tomato sauce and paste. Regular tomato and vegetable juices.  Fruits Dried fruits. Canned fruit in light or heavy syrup. Fruit juice.  Meat and Other Protein Products Fatty cuts of meat. Ribs,  chicken wings, bacon, sausage, bologna, salami, chitterlings, fatback, hot dogs, bratwurst, and packaged luncheon meats. Salted nuts and seeds. Canned  beans with salt.  Dairy Whole or 2% milk, cream, half-and-half, and cream cheese. Whole-fat or sweetened yogurt. Full-fat cheeses or blue cheese. Nondairy creamers and whipped toppings. Processed cheese, cheese spreads, or cheese curds.  Condiments Onion and garlic salt, seasoned salt, table salt, and sea salt. Canned and packaged gravies. Worcestershire sauce. Tartar sauce. Barbecue sauce. Teriyaki sauce. Soy sauce, including reduced sodium. Steak sauce. Fish sauce. Oyster sauce. Cocktail sauce. Horseradish. Ketchup and mustard. Meat flavorings and tenderizers. Bouillon cubes. Hot sauce. Tabasco sauce. Marinades. Taco seasonings. Relishes.  Fats and Oils Butter, stick margarine, lard, shortening, ghee, and bacon fat. Coconut, palm kernel, or palm oils. Regular salad dressings.  Other Pickles and olives. Salted popcorn and pretzels.  The items listed above may not be a complete list of foods and beverages to avoid. Contact your dietitian for more information.  WHERE CAN I FIND MORE INFORMATION? National Heart, Lung, and Blood Institute: [employer] Document Released: 05/20/2011 Document Revised: 10/15/2013 Document Reviewed: 04/04/2013 [facility] Patient Information 2015 [place], [place]. This information is not intended to replace advice given to you by your health care provider. Make sure you discuss any questions you have with your health care provider.   I think that you would greatly benefit from seeing a nutritionist.  If you are interested, please call Dr. Wonda at [phone] to schedule an appointment.

## 2024-06-04 ENCOUNTER — Telehealth: Payer: Self-pay | Admitting: Pharmacist

## 2024-06-04 DIAGNOSIS — E7800 Pure hypercholesterolemia, unspecified: Secondary | ICD-10-CM

## 2024-06-04 MED ORDER — ATORVASTATIN CALCIUM 40 MG PO TABS: 40.0000 mg | ORAL_TABLET | Freq: Every day | ORAL | 3 refills | Status: AC

## 2024-06-04 NOTE — Telephone Encounter (Signed)
" ° °  This patient is appearing on a report for being at risk of failing the adherence measure for cholesterol (statin) medications this calendar year.   Medication: atorvastatin  Last fill date: 6/9 for 90 day supply  Contacted pharmacy to facilitate refills. and Will collaborate with provider to facilitate refill needs.   Roger Kettles Dattero Jamina Macbeth, PharmD, BCACP, CPP Clinical Pharmacist, [facility]  "

## 2024-06-13 ENCOUNTER — Other Ambulatory Visit: Payer: Self-pay | Admitting: Family Medicine

## 2024-06-13 NOTE — Telephone Encounter (Signed)
 Copied from CRM [id number]. Topic: Clinical - Medication Refill >> Jun 13, 2024 10:20 AM Graeme ORN wrote: Medication:  ondansetron  (ZOFRAN ) 4 MG tablet - was supposed to send to pharmacy at last visit   Has the patient contacted their pharmacy? Yes (Agent: If no, request that the patient contact the pharmacy for the refill. If patient does not wish to contact the pharmacy document the reason why and proceed with request.) (Agent: If yes, when and what did the pharmacy advise?)  This is the patient's preferred pharmacy:  [id number] - [place], [place] - [address] [place] [address] Phone: [phone] Fax: [phone]    Is this the correct pharmacy for this prescription? Yes If no, delete pharmacy and type the correct one.   Has the prescription been filled recently? Yes  Is the patient out of the medication? Yes  Has the patient been seen for an appointment in the last year OR does the patient have an upcoming appointment? Yes  Can we respond through MyChart? No  Agent: Please be advised that Rx refills may take up to 3 business days. We ask that you follow-up with your pharmacy.

## 2024-06-18 MED ORDER — ONDANSETRON HCL 4 MG PO TABS: 4.0000 mg | ORAL_TABLET | Freq: Four times a day (QID) | ORAL | 3 refills | Status: AC

## 2024-06-30 ENCOUNTER — Other Ambulatory Visit: Payer: Self-pay | Admitting: Family Medicine

## 2024-06-30 DIAGNOSIS — I10 Essential (primary) hypertension: Secondary | ICD-10-CM

## 2024-07-17 ENCOUNTER — Ambulatory Visit

## 2024-08-29 ENCOUNTER — Ambulatory Visit: Admitting: Family Medicine

## 2024-09-18 ENCOUNTER — Ambulatory Visit
